# Patient Record
Sex: Male | Born: 1950 | Race: White | Hispanic: No | Smoking: Former smoker
Health system: Southern US, Community
[De-identification: ages and names within clinical notes are randomized; demographics above are authoritative.]

## PROBLEM LIST (undated history)

## (undated) DIAGNOSIS — K219 Gastro-esophageal reflux disease without esophagitis: Secondary | ICD-10-CM

## (undated) DIAGNOSIS — C61 Malignant neoplasm of prostate: Secondary | ICD-10-CM

## (undated) DIAGNOSIS — Z9889 Other specified postprocedural states: Secondary | ICD-10-CM

## (undated) DIAGNOSIS — E785 Hyperlipidemia, unspecified: Secondary | ICD-10-CM

## (undated) DIAGNOSIS — N400 Enlarged prostate without lower urinary tract symptoms: Secondary | ICD-10-CM

## (undated) DIAGNOSIS — I10 Essential (primary) hypertension: Secondary | ICD-10-CM

## (undated) HISTORY — DX: Essential (primary) hypertension: I10

## (undated) HISTORY — PX: TONSILLECTOMY: SUR1361

## (undated) HISTORY — DX: Gastro-esophageal reflux disease without esophagitis: K21.9

## (undated) HISTORY — DX: Other specified postprocedural states: Z98.890

## (undated) HISTORY — DX: Hyperlipidemia, unspecified: E78.5

## (undated) HISTORY — DX: Malignant neoplasm of prostate: C61

## (undated) HISTORY — DX: Benign prostatic hyperplasia without lower urinary tract symptoms: N40.0

---

## 1999-03-09 HISTORY — PX: NISSEN FUNDOPLICATION: SHX2091

## 2004-01-24 ENCOUNTER — Ambulatory Visit: Payer: Self-pay | Admitting: Internal Medicine

## 2004-01-31 ENCOUNTER — Ambulatory Visit: Payer: Self-pay | Admitting: Internal Medicine

## 2004-02-13 ENCOUNTER — Encounter: Admission: RE | Admit: 2004-02-13 | Discharge: 2004-02-25 | Payer: Self-pay | Admitting: *Deleted

## 2004-04-30 ENCOUNTER — Ambulatory Visit: Payer: Self-pay | Admitting: Internal Medicine

## 2004-08-06 ENCOUNTER — Ambulatory Visit: Payer: Self-pay | Admitting: Internal Medicine

## 2005-04-29 ENCOUNTER — Ambulatory Visit: Payer: Self-pay | Admitting: Internal Medicine

## 2005-05-06 ENCOUNTER — Ambulatory Visit: Payer: Self-pay | Admitting: Internal Medicine

## 2005-09-06 ENCOUNTER — Ambulatory Visit: Payer: Self-pay | Admitting: Internal Medicine

## 2006-07-19 ENCOUNTER — Ambulatory Visit: Payer: Self-pay | Admitting: Internal Medicine

## 2006-07-19 LAB — CONVERTED CEMR LAB
ALT: 35 units/L (ref 0–40)
AST: 24 units/L (ref 0–37)
Albumin: 4.2 g/dL (ref 3.5–5.2)
Alkaline Phosphatase: 87 units/L (ref 39–117)
BUN: 14 mg/dL (ref 6–23)
Basophils Absolute: 0 10*3/uL (ref 0.0–0.1)
Basophils Relative: 0.1 % (ref 0.0–1.0)
Bilirubin, Direct: 0.1 mg/dL (ref 0.0–0.3)
CO2: 28 meq/L (ref 19–32)
Calcium: 9.3 mg/dL (ref 8.4–10.5)
Chloride: 105 meq/L (ref 96–112)
Cholesterol: 165 mg/dL (ref 0–200)
Creatinine, Ser: 1 mg/dL (ref 0.4–1.5)
Eosinophils Absolute: 0.1 10*3/uL (ref 0.0–0.6)
Eosinophils Relative: 1.1 % (ref 0.0–5.0)
GFR calc Af Amer: 100 mL/min
GFR calc non Af Amer: 82 mL/min
Glucose, Bld: 91 mg/dL (ref 70–99)
HCT: 45.3 % (ref 39.0–52.0)
HDL: 41.6 mg/dL (ref >39.0)
Hemoglobin: 15.4 g/dL (ref 13.0–17.0)
LDL Cholesterol: 103 mg/dL — ABNORMAL HIGH (ref 0–99)
Lymphocytes Relative: 25.1 % (ref 12.0–46.0)
MCHC: 33.9 g/dL (ref 30.0–36.0)
MCV: 89 fL (ref 78.0–100.0)
Monocytes Absolute: 0.7 10*3/uL (ref 0.2–0.7)
Monocytes Relative: 8.1 % (ref 3.0–11.0)
Neutro Abs: 5.5 10*3/uL (ref 1.4–7.7)
Neutrophils Relative %: 65.6 % (ref 43.0–77.0)
PSA: 4.14 ng/mL — ABNORMAL HIGH (ref 0.10–4.00)
Platelets: 178 10*3/uL (ref 150–400)
Potassium: 4.5 meq/L (ref 3.5–5.1)
RBC: 5.09 M/uL (ref 4.22–5.81)
RDW: 12.7 % (ref 11.5–14.6)
Sodium: 139 meq/L (ref 135–145)
TSH: 1.24 microintl units/mL (ref 0.35–5.50)
Total Bilirubin: 0.7 mg/dL (ref 0.3–1.2)
Total CHOL/HDL Ratio: 4
Total Protein: 6.7 g/dL (ref 6.0–8.3)
Triglycerides: 103 mg/dL (ref 0–149)
VLDL: 21 mg/dL (ref 0–40)
WBC: 8.4 10*3/uL (ref 4.5–10.5)

## 2006-07-26 ENCOUNTER — Ambulatory Visit: Payer: Self-pay | Admitting: Internal Medicine

## 2006-07-28 ENCOUNTER — Encounter: Payer: Self-pay | Admitting: Internal Medicine

## 2006-07-28 DIAGNOSIS — Z9889 Other specified postprocedural states: Secondary | ICD-10-CM

## 2006-07-28 DIAGNOSIS — E785 Hyperlipidemia, unspecified: Secondary | ICD-10-CM

## 2006-07-28 DIAGNOSIS — K219 Gastro-esophageal reflux disease without esophagitis: Secondary | ICD-10-CM

## 2006-07-28 DIAGNOSIS — N400 Enlarged prostate without lower urinary tract symptoms: Secondary | ICD-10-CM

## 2006-07-28 HISTORY — DX: Benign prostatic hyperplasia without lower urinary tract symptoms: N40.0

## 2006-07-28 HISTORY — DX: Other specified postprocedural states: Z98.890

## 2006-07-28 HISTORY — DX: Hyperlipidemia, unspecified: E78.5

## 2006-07-28 HISTORY — DX: Gastro-esophageal reflux disease without esophagitis: K21.9

## 2007-01-30 ENCOUNTER — Telehealth (INDEPENDENT_AMBULATORY_CARE_PROVIDER_SITE_OTHER): Payer: Self-pay | Admitting: *Deleted

## 2007-02-03 ENCOUNTER — Telehealth: Payer: Self-pay | Admitting: Internal Medicine

## 2007-02-08 ENCOUNTER — Ambulatory Visit: Payer: Self-pay | Admitting: Internal Medicine

## 2007-02-09 ENCOUNTER — Telehealth (INDEPENDENT_AMBULATORY_CARE_PROVIDER_SITE_OTHER): Payer: Self-pay | Admitting: *Deleted

## 2007-02-09 LAB — CONVERTED CEMR LAB: PSA: 4.97 ng/mL — ABNORMAL HIGH (ref 0.10–4.00)

## 2007-02-16 ENCOUNTER — Encounter: Payer: Self-pay | Admitting: Internal Medicine

## 2007-02-24 ENCOUNTER — Encounter: Payer: Self-pay | Admitting: Internal Medicine

## 2007-02-28 ENCOUNTER — Encounter: Payer: Self-pay | Admitting: Internal Medicine

## 2007-03-09 HISTORY — PX: PROSTATECTOMY: SHX69

## 2007-03-20 ENCOUNTER — Encounter: Payer: Self-pay | Admitting: Internal Medicine

## 2007-03-27 ENCOUNTER — Ambulatory Visit: Payer: Self-pay | Admitting: Internal Medicine

## 2007-03-27 DIAGNOSIS — I10 Essential (primary) hypertension: Secondary | ICD-10-CM

## 2007-03-27 HISTORY — DX: Essential (primary) hypertension: I10

## 2007-04-20 ENCOUNTER — Encounter (INDEPENDENT_AMBULATORY_CARE_PROVIDER_SITE_OTHER): Payer: Self-pay | Admitting: Urology

## 2007-04-20 ENCOUNTER — Inpatient Hospital Stay (HOSPITAL_COMMUNITY): Admission: RE | Admit: 2007-04-20 | Discharge: 2007-04-21 | Payer: Self-pay | Admitting: Urology

## 2007-04-28 ENCOUNTER — Encounter: Payer: Self-pay | Admitting: Internal Medicine

## 2007-05-15 ENCOUNTER — Ambulatory Visit: Payer: Self-pay | Admitting: Internal Medicine

## 2007-06-02 ENCOUNTER — Encounter: Payer: Self-pay | Admitting: Internal Medicine

## 2007-07-24 ENCOUNTER — Ambulatory Visit: Payer: Self-pay | Admitting: Internal Medicine

## 2007-07-24 LAB — CONVERTED CEMR LAB
ALT: 36 units/L (ref 0–53)
AST: 26 units/L (ref 0–37)
Albumin: 4 g/dL (ref 3.5–5.2)
Alkaline Phosphatase: 97 units/L (ref 39–117)
BUN: 13 mg/dL (ref 6–23)
Basophils Absolute: 0 10*3/uL (ref 0.0–0.1)
Basophils Relative: 0.3 % (ref 0.0–1.0)
Bilirubin Urine: NEGATIVE
Bilirubin, Direct: 0.1 mg/dL (ref 0.0–0.3)
Blood in Urine, dipstick: NEGATIVE
CO2: 30 meq/L (ref 19–32)
Calcium: 9.3 mg/dL (ref 8.4–10.5)
Chloride: 108 meq/L (ref 96–112)
Cholesterol: 174 mg/dL (ref 0–200)
Creatinine, Ser: 0.9 mg/dL (ref 0.4–1.5)
Eosinophils Absolute: 0.1 10*3/uL (ref 0.0–0.7)
Eosinophils Relative: 1.3 % (ref 0.0–5.0)
GFR calc Af Amer: 112 mL/min
GFR calc non Af Amer: 93 mL/min
Glucose, Bld: 85 mg/dL (ref 70–99)
Glucose, Urine, Semiquant: NEGATIVE
HCT: 44.3 % (ref 39.0–52.0)
HDL: 42.5 mg/dL (ref >39.0)
Hemoglobin: 15.2 g/dL (ref 13.0–17.0)
Ketones, urine, test strip: NEGATIVE
LDL Cholesterol: 107 mg/dL — ABNORMAL HIGH (ref 0–99)
Lymphocytes Relative: 25.4 % (ref 12.0–46.0)
MCHC: 34.4 g/dL (ref 30.0–36.0)
MCV: 87.9 fL (ref 78.0–100.0)
Monocytes Absolute: 0.6 10*3/uL (ref 0.1–1.0)
Monocytes Relative: 7.1 % (ref 3.0–12.0)
Neutro Abs: 5.1 10*3/uL (ref 1.4–7.7)
Neutrophils Relative %: 65.9 % (ref 43.0–77.0)
Nitrite: NEGATIVE
PSA: 0 ng/mL — ABNORMAL LOW (ref 0.10–4.00)
Platelets: 170 10*3/uL (ref 150–400)
Potassium: 4.4 meq/L (ref 3.5–5.1)
RBC: 5.04 M/uL (ref 4.22–5.81)
RDW: 12.9 % (ref 11.5–14.6)
Sodium: 140 meq/L (ref 135–145)
Specific Gravity, Urine: 1.02
TSH: 1.21 microintl units/mL (ref 0.35–5.50)
Total Bilirubin: 0.6 mg/dL (ref 0.3–1.2)
Total CHOL/HDL Ratio: 4.1
Total Protein: 6.5 g/dL (ref 6.0–8.3)
Triglycerides: 121 mg/dL (ref 0–149)
Urobilinogen, UA: 0.2
VLDL: 24 mg/dL (ref 0–40)
WBC Urine, dipstick: NEGATIVE
WBC: 7.6 10*3/uL (ref 4.5–10.5)
pH: 7

## 2007-08-01 ENCOUNTER — Ambulatory Visit: Payer: Self-pay | Admitting: Internal Medicine

## 2007-08-01 DIAGNOSIS — C61 Malignant neoplasm of prostate: Secondary | ICD-10-CM

## 2007-08-01 HISTORY — DX: Malignant neoplasm of prostate: C61

## 2007-08-17 ENCOUNTER — Telehealth: Payer: Self-pay | Admitting: Internal Medicine

## 2008-01-30 ENCOUNTER — Encounter: Payer: Self-pay | Admitting: Internal Medicine

## 2008-02-08 ENCOUNTER — Ambulatory Visit: Payer: Self-pay | Admitting: Internal Medicine

## 2008-05-28 ENCOUNTER — Encounter: Payer: Self-pay | Admitting: Internal Medicine

## 2008-08-16 ENCOUNTER — Ambulatory Visit: Payer: Self-pay | Admitting: Internal Medicine

## 2008-08-16 LAB — CONVERTED CEMR LAB
ALT: 46 units/L (ref 0–53)
AST: 29 units/L (ref 0–37)
Albumin: 4.1 g/dL (ref 3.5–5.2)
Alkaline Phosphatase: 85 units/L (ref 39–117)
BUN: 16 mg/dL (ref 6–23)
Basophils Absolute: 0 10*3/uL (ref 0.0–0.1)
Basophils Relative: 0.3 % (ref 0.0–3.0)
Bilirubin Urine: NEGATIVE
Bilirubin, Direct: 0.1 mg/dL (ref 0.0–0.3)
Blood in Urine, dipstick: NEGATIVE
CO2: 29 meq/L (ref 19–32)
Calcium: 9 mg/dL (ref 8.4–10.5)
Chloride: 106 meq/L (ref 96–112)
Cholesterol: 171 mg/dL (ref 0–200)
Creatinine, Ser: 0.9 mg/dL (ref 0.4–1.5)
Eosinophils Absolute: 0.1 10*3/uL (ref 0.0–0.7)
Eosinophils Relative: 1.7 % (ref 0.0–5.0)
GFR calc non Af Amer: 92.1 mL/min (ref >60)
Glucose, Bld: 91 mg/dL (ref 70–99)
Glucose, Urine, Semiquant: NEGATIVE
HCT: 44.4 % (ref 39.0–52.0)
HDL: 46.3 mg/dL (ref >39.00)
Hemoglobin: 15.1 g/dL (ref 13.0–17.0)
Ketones, urine, test strip: NEGATIVE
LDL Cholesterol: 104 mg/dL — ABNORMAL HIGH (ref 0–99)
Lymphocytes Relative: 25.8 % (ref 12.0–46.0)
Lymphs Abs: 1.9 10*3/uL (ref 0.7–4.0)
MCHC: 34 g/dL (ref 30.0–36.0)
MCV: 90.4 fL (ref 78.0–100.0)
Monocytes Absolute: 0.6 10*3/uL (ref 0.1–1.0)
Monocytes Relative: 8.7 % (ref 3.0–12.0)
Neutro Abs: 4.7 10*3/uL (ref 1.4–7.7)
Neutrophils Relative %: 63.5 % (ref 43.0–77.0)
Nitrite: NEGATIVE
PSA: 0.01 ng/mL — ABNORMAL LOW (ref 0.10–4.00)
Platelets: 161 10*3/uL (ref 150.0–400.0)
Potassium: 4.2 meq/L (ref 3.5–5.1)
RBC: 4.92 M/uL (ref 4.22–5.81)
RDW: 12.4 % (ref 11.5–14.6)
Sodium: 137 meq/L (ref 135–145)
Specific Gravity, Urine: 1.02
TSH: 1.32 microintl units/mL (ref 0.35–5.50)
Total Bilirubin: 0.8 mg/dL (ref 0.3–1.2)
Total CHOL/HDL Ratio: 4
Total Protein: 6.6 g/dL (ref 6.0–8.3)
Triglycerides: 103 mg/dL (ref 0.0–149.0)
Urobilinogen, UA: 0.2
VLDL: 20.6 mg/dL (ref 0.0–40.0)
WBC Urine, dipstick: NEGATIVE
WBC: 7.3 10*3/uL (ref 4.5–10.5)
pH: 6.5

## 2008-08-22 ENCOUNTER — Encounter: Payer: Self-pay | Admitting: Internal Medicine

## 2008-08-23 ENCOUNTER — Ambulatory Visit: Payer: Self-pay | Admitting: Internal Medicine

## 2008-11-18 ENCOUNTER — Encounter: Payer: Self-pay | Admitting: Internal Medicine

## 2009-05-19 ENCOUNTER — Encounter: Payer: Self-pay | Admitting: Internal Medicine

## 2009-11-17 ENCOUNTER — Encounter: Payer: Self-pay | Admitting: Internal Medicine

## 2009-11-17 ENCOUNTER — Telehealth: Payer: Self-pay | Admitting: Internal Medicine

## 2010-01-09 ENCOUNTER — Telehealth: Payer: Self-pay | Admitting: Internal Medicine

## 2010-01-20 ENCOUNTER — Ambulatory Visit: Payer: Self-pay | Admitting: Internal Medicine

## 2010-01-20 LAB — CONVERTED CEMR LAB
ALT: 45 units/L (ref 0–53)
AST: 32 units/L (ref 0–37)
Albumin: 4.2 g/dL (ref 3.5–5.2)
Alkaline Phosphatase: 97 units/L (ref 39–117)
BUN: 15 mg/dL (ref 6–23)
Basophils Absolute: 0 10*3/uL (ref 0.0–0.1)
Basophils Relative: 0.5 % (ref 0.0–3.0)
Bilirubin Urine: NEGATIVE
Bilirubin, Direct: 0.1 mg/dL (ref 0.0–0.3)
Blood in Urine, dipstick: NEGATIVE
CO2: 26 meq/L (ref 19–32)
Calcium: 9.2 mg/dL (ref 8.4–10.5)
Chloride: 102 meq/L (ref 96–112)
Cholesterol: 173 mg/dL (ref 0–200)
Creatinine, Ser: 1 mg/dL (ref 0.4–1.5)
Eosinophils Absolute: 0.1 10*3/uL (ref 0.0–0.7)
Eosinophils Relative: 1.2 % (ref 0.0–5.0)
GFR calc non Af Amer: 78.43 mL/min (ref >60)
Glucose, Bld: 94 mg/dL (ref 70–99)
Glucose, Urine, Semiquant: NEGATIVE
HCT: 44.4 % (ref 39.0–52.0)
HDL: 45.7 mg/dL (ref >39.00)
Hemoglobin: 15.1 g/dL (ref 13.0–17.0)
Ketones, urine, test strip: NEGATIVE
LDL Cholesterol: 108 mg/dL — ABNORMAL HIGH (ref 0–99)
Lymphocytes Relative: 27.4 % (ref 12.0–46.0)
Lymphs Abs: 2.1 10*3/uL (ref 0.7–4.0)
MCHC: 34 g/dL (ref 30.0–36.0)
MCV: 90.5 fL (ref 78.0–100.0)
Monocytes Absolute: 0.7 10*3/uL (ref 0.1–1.0)
Monocytes Relative: 8.3 % (ref 3.0–12.0)
Neutro Abs: 4.9 10*3/uL (ref 1.4–7.7)
Neutrophils Relative %: 62.6 % (ref 43.0–77.0)
Nitrite: NEGATIVE
PSA: 0 ng/mL — ABNORMAL LOW (ref 0.10–4.00)
Platelets: 158 10*3/uL (ref 150.0–400.0)
Potassium: 4.5 meq/L (ref 3.5–5.1)
RBC: 4.91 M/uL (ref 4.22–5.81)
RDW: 13.7 % (ref 11.5–14.6)
Sodium: 134 meq/L — ABNORMAL LOW (ref 135–145)
Specific Gravity, Urine: 1.02
TSH: 0.95 microintl units/mL (ref 0.35–5.50)
Total Bilirubin: 0.6 mg/dL (ref 0.3–1.2)
Total CHOL/HDL Ratio: 4
Total Protein: 6.2 g/dL (ref 6.0–8.3)
Triglycerides: 99 mg/dL (ref 0.0–149.0)
Urobilinogen, UA: 0.2
VLDL: 19.8 mg/dL (ref 0.0–40.0)
WBC Urine, dipstick: NEGATIVE
WBC: 7.8 10*3/uL (ref 4.5–10.5)
pH: 7

## 2010-02-02 ENCOUNTER — Ambulatory Visit: Payer: Self-pay | Admitting: Internal Medicine

## 2010-02-03 ENCOUNTER — Encounter (INDEPENDENT_AMBULATORY_CARE_PROVIDER_SITE_OTHER): Payer: Self-pay | Admitting: *Deleted

## 2010-03-06 ENCOUNTER — Encounter (INDEPENDENT_AMBULATORY_CARE_PROVIDER_SITE_OTHER): Payer: Self-pay | Admitting: *Deleted

## 2010-03-11 ENCOUNTER — Ambulatory Visit
Admission: RE | Admit: 2010-03-11 | Discharge: 2010-03-11 | Payer: Self-pay | Source: Home / Self Care | Attending: Internal Medicine | Admitting: Internal Medicine

## 2010-03-18 ENCOUNTER — Encounter: Payer: Self-pay | Admitting: Internal Medicine

## 2010-03-18 ENCOUNTER — Ambulatory Visit
Admission: RE | Admit: 2010-03-18 | Discharge: 2010-03-18 | Payer: Self-pay | Source: Home / Self Care | Attending: Internal Medicine | Admitting: Internal Medicine

## 2010-03-24 ENCOUNTER — Encounter: Payer: Self-pay | Admitting: Internal Medicine

## 2010-03-26 LAB — HM COLONOSCOPY

## 2010-04-07 NOTE — Letter (Signed)
Summary: [facility]  [facility]   Imported By: Maryln Gottron 06/04/2008 11:18:39  _____________________________________________________________________  External Attachment:    Type:   Image     Comment:   External Document

## 2010-04-07 NOTE — Progress Notes (Signed)
Summary: psa lab -SCHEDULED   Phone Note Call from Patient Call back at Home Phone [phone]   Caller: Patient Call For: drk Summary of Call: pt stated its time for psa recheck can i sch it Initial call taken by: Heron Sabins,  February 03, 2007 10:41 AM  Follow-up for Phone Call        lm with pt wife Follow-up by: Heron Sabins,  February 06, 2007 3:45 PM  Additional Follow-up for Phone Call Additional follow up Details #1::        PT WILL COME ON WEDNESDAY FOR PSA PER DR. Additional Follow-up by: Warnell Forester,  February 06, 2007 4:03 PM    Yes

## 2010-04-07 NOTE — Consult Note (Signed)
Summary: [facility]/Kimbrough, MD  [facility]/Kimbrough, MD   Imported By: Lenard Forth 05/06/2007 11:43:38  _____________________________________________________________________  External Attachment:    Type:   Image     Comment:   External Document

## 2010-04-07 NOTE — Letter (Signed)
[facility]   Imported By: Kassie Mends 03/06/2007 09:42:32  _____________________________________________________________________  External Attachment:    Type:   Image     Comment:   [facility]

## 2010-04-07 NOTE — Letter (Signed)
Summary: Pre Visit Letter Revised  [facility]  [address] [place], [place] [address]   Phone: [phone]  Fax: [phone]        02/03/2010 MRN: [id number] [facility] [address] [place], [place]  [address]             Procedure Date:  03/18/2010  Welcome to the Gastroenterology Division at [facility].    You are scheduled to see a nurse for your pre-procedure visit on 03/10/2010 at 1:00PM on the 3rd floor at [facility], [address].  We ask that you try to arrive at our office 15 minutes prior to your appointment time to allow for check-in.  Please take a minute to review the attached form.  If you answer "Yes" to one or more of the questions on the first page, we ask that you call the person listed at your earliest opportunity.  If you answer "No" to all of the questions, please complete the rest of the form and bring it to your appointment.    Your nurse visit will consist of discussing your medical and surgical history, your immediate family medical history, and your medications.   If you are unable to list all of your medications on the form, please bring the medication bottles to your appointment and we will list them.  We will need to be aware of both prescribed and over the counter drugs.  We will need to know exact dosage information as well.    Please be prepared to read and sign documents such as consent forms, a financial agreement, and acknowledgement forms.  If necessary, and with your consent, a friend or relative is welcome to sit-in on the nurse visit with you.  Please bring your insurance card so that we may make a copy of it.  If your insurance requires a referral to see a specialist, please bring your referral form from your primary care physician.  No co-pay is required for this nurse visit.     If you cannot keep your appointment, please call [phone] to cancel or reschedule prior to your appointment date.  This allows [place]  the opportunity to schedule an appointment for another patient in need of care.    Thank you for choosing [facility] for your medical needs.  We appreciate the opportunity to care for you.  Please visit [place] at our website  to learn more about our practice.  Sincerely, The Gastroenterology Division

## 2010-04-07 NOTE — Assessment & Plan Note (Signed)
Summary: HYPERTENSION/ DM  Medications Added LIPITOR 40 MG  TABS (ATORVASTATIN CALCIUM) 1 once daily ADULT ASPIRIN EC LOW STRENGTH 81 MG  TBEC (ASPIRIN) 1 once daily ALPRAZOLAM 0.5 MG  TABS (ALPRAZOLAM) one twice daily as needed      Allergies Added: NKDA  Vital Signs:  Patient Profile:   60 Years Old Male Height:     74 inches (187.96 cm) Weight:      240 pounds Temp:     98.1 degrees F oral BP sitting:   144 / 90  (left arm) Cuff size:   regular  Vitals Entered By: Raechel Ache, RN (March 27, 2007 4:29 PM)                 Chief Complaint:  Says BP has been elevated; headaches. Very stressed out about dx of prostate CA and upcoming surgery. Needs nerve pill. Has red spot OD since Sat..  History of Present Illness: 59 year old patient seen today for follow-up ;complaints include increasing stress.  This is multi-factorial, and related to her pressures, illness in his family, and a recent diagnosis of prostate cancer.  He is anticipating surgery next month.  Blood pressure readings at urology have been consistently high.  He complains of some worsening situational stress and is requesting alprazolam  Current Allergies: No known allergies       Physical Exam  General:     Well-developed,well-nourished,in no acute distress; alert,appropriate and cooperative throughout examination  150/94 Head:     Normocephalic and atraumatic without obvious abnormalities. No apparent alopecia or balding. Eyes:     right-sided sub-conjunctival. hemorrhage Ears:     External ear exam shows no significant lesions or deformities.  Otoscopic examination reveals clear canals, tympanic membranes are intact bilaterally without bulging, retraction, inflammation or discharge. Hearing is grossly normal bilaterally. Nose:     External nasal examination shows no deformity or inflammation. Nasal mucosa are pink and moist without lesions or exudates. Mouth:     Oral mucosa and oropharynx  without lesions or exudates.  Teeth in good repair.    Impression & Recommendations:  Problem # 1:  ESSENTIAL HYPERTENSION (ICD-401.9)  Problem # 2:  GERD (ICD-530.81)  Complete Medication List: 1)  Lipitor 40 Mg Tabs (Atorvastatin calcium) .Marland Kitchen.. 1 once daily 2)  Adult Aspirin Ec Low Strength 81 Mg Tbec (Aspirin) .Marland Kitchen.. 1 once daily 3)  Alprazolam 0.5 Mg Tabs (Alprazolam) .... One twice daily as needed   Patient Instructions: 1)  return office visit in 6 weeks 2)  Limit your Sodium (Salt) to less than 2 grams a day(slightly less than 1/2 a teaspoon) to prevent fluid retention, swelling, or worsening of symptoms. 3)  It is important that you exercise regularly at least 20 minutes 5 times a week. If you develop chest pain, have severe difficulty breathing, or feel very tired , stop exercising immediately and seek medical attention.    Prescriptions: ALPRAZOLAM 0.5 MG  TABS (ALPRAZOLAM) one twice daily as needed  #100 x 2   Entered and Authorized by:   Gordy Savers  MD   Signed by:   Gordy Savers  MD on 03/27/2007   Method used:   Print then Give to Patient   RxID:   [id number]  ]

## 2010-04-07 NOTE — Progress Notes (Signed)
Summary: refill alprazolam  Phone Note Refill Request Message from:  Fax from Pharmacy on November 17, 2009 1:56 PM  Refills Requested: Medication #1:  ALPRAZOLAM 0.5 MG  TABS one twice daily as needed   Last [facility]: [facility] [address]      Method Requested: Fax to Local Pharmacy Initial call taken by: Duard Brady LPN,  November 17, 2009 1:56 PM    Prescriptions: ALPRAZOLAM 0.5 MG  TABS (ALPRAZOLAM) one twice daily as needed  #60 x 0   Entered by:   Duard Brady LPN   Authorized by:   Gordy Savers  MD   Signed by:   Duard Brady LPN on 16/12/9602   Method used:   Historical   RxID:   [id number]  must be seen - last seen 08/2008. KIK

## 2010-04-07 NOTE — Assessment & Plan Note (Signed)
Summary: cpx/nta   Vital Signs:  Patient Profile:   60 Years Old Male Height:     74 inches (187.96 cm) Weight:      239 pounds Temp:     98.1 degrees F oral BP sitting:   132 / 90  (left arm) Cuff size:   regular  Vitals Entered By: Raechel Ache, RN (Aug 01, 2007 1:13 PM)                 Chief Complaint:  CPX and labs done.Marland Kitchen  History of Present Illness: 60 year old patient seen today for an annual exam.  She has a history of hypercholesterolemia gastroesophageal reflux disease, and history of prostate cancer    Current Allergies: No known allergies   Past Medical History:    GERD    Hyperlipidemia    Benign prostatic hypertrophy    Hypertension    prostate cancer  Past Surgical History:    Tonsillectomy    sigmoidoscopy 2004    prostatectomy  2009   Family History:    father died age 50, prostate cancer    mother late 47s history of chronic atrial fibrillation; CVD        Two brothers in good health  Social History:    Reviewed history and no changes required:       Married    Review of Systems       follow with Dr. Aldean Ast for prostate cancer   Physical Exam  General:     Well-developed,well-nourished,in no acute distress; alert,appropriate and cooperative throughout examination Head:     Normocephalic and atraumatic without obvious abnormalities. No apparent alopecia or balding. Eyes:     No corneal or conjunctival inflammation noted. EOMI. Perrla. Funduscopic exam benign, without hemorrhages, exudates or papilledema. Vision grossly normal. Mouth:     Oral mucosa and oropharynx without lesions or exudates.  Teeth in good repair. Neck:     No deformities, masses, or tenderness noted. Lungs:     Normal respiratory effort, chest expands symmetrically. Lungs are clear to auscultation, no crackles or wheezes. Heart:     Normal rate and regular rhythm. S1 and S2 normal without gallop, murmur, click, rub or other extra sounds. Abdomen:  Bowel sounds positive,abdomen soft and non-tender without masses, organomegaly or hernias noted. Genitalia:     Testes bilaterally descended without nodularity, tenderness or masses. No scrotal masses or lesions. No penis lesions or urethral discharge. Msk:     No deformity or scoliosis noted of thoracic or lumbar spine.   Pulses:     R and L carotid,radial,femoral,dorsalis pedis and posterior tibial pulses are full and equal bilaterally Extremities:     No clubbing, cyanosis, edema, or deformity noted with normal full range of motion of all joints.   Neurologic:     No cranial nerve deficits noted. Station and gait are normal. Plantar reflexes are down-going bilaterally. DTRs are symmetrical throughout. Sensory, motor and coordinative functions appear intact. Skin:     Intact without suspicious lesions or rashes Cervical Nodes:     No lymphadenopathy noted Axillary Nodes:     No palpable lymphadenopathy Inguinal Nodes:     No significant adenopathy Psych:     Cognition and judgment appear intact. Alert and cooperative with normal attention span and concentration. No apparent delusions, illusions, hallucinations    Impression & Recommendations:  Problem # 1:  HYPERTENSION (ICD-401.9)  His updated medication list for this problem includes:    Benazepril-hydrochlorothiazide 20-12.5 Mg Tabs (  Benazepril-hydrochlorothiazide) ..... One daily   Problem # 2:  HYPERLIPIDEMIA (ICD-272.4)  His updated medication list for this problem includes:    Lipitor 40 Mg Tabs (Atorvastatin calcium) .Marland Kitchen... 1 once daily   Problem # 3:  GERD (ICD-530.81)  Problem # 4:  NEOPLASM, MALIGNANT, PROSTATE (ICD-185)  Complete Medication List: 1)  Lipitor 40 Mg Tabs (Atorvastatin calcium) .Marland Kitchen.. 1 once daily 2)  Adult Aspirin Ec Low Strength 81 Mg Tbec (Aspirin) .Marland Kitchen.. 1 once daily 3)  Alprazolam 0.5 Mg Tabs (Alprazolam) .... One twice daily as needed 4)  Benazepril-hydrochlorothiazide 20-12.5 Mg Tabs  (Benazepril-hydrochlorothiazide) .... One daily   Patient Instructions: 1)  Please schedule a follow-up appointment in 6 months. 2)  Limit your Sodium (Salt) to less than 2 grams a day(slightly less than 1/2 a teaspoon) to prevent fluid retention, swelling, or worsening of symptoms. 3)  It is important that you exercise regularly at least 20 minutes 5 times a week. If you develop chest pain, have severe difficulty breathing, or feel very tired , stop exercising immediately and seek medical attention. 4)  Schedule a colonoscopy/sigmoidoscopy to help detect colon cancer.   Prescriptions: BENAZEPRIL-HYDROCHLOROTHIAZIDE 20-12.5 MG  TABS (BENAZEPRIL-HYDROCHLOROTHIAZIDE) one daily  #90 x 6   Entered and Authorized by:   Gordy Savers  MD   Signed by:   Gordy Savers  MD on 08/01/2007   Method used:   Print then Give to Patient   RxID:   [id number] ALPRAZOLAM 0.5 MG  TABS (ALPRAZOLAM) one twice daily as needed  #100 x 2   Entered and Authorized by:   Gordy Savers  MD   Signed by:   Gordy Savers  MD on 08/01/2007   Method used:   Print then Give to Patient   RxID:   [id number] LIPITOR 40 MG  TABS (ATORVASTATIN CALCIUM) 1 once daily  #90 x 6   Entered and Authorized by:   Gordy Savers  MD   Signed by:   Gordy Savers  MD on 08/01/2007   Method used:   Print then Give to Patient   RxID:   [id number]  ]

## 2010-04-07 NOTE — Assessment & Plan Note (Signed)
Summary: 6 WEEK ROA/JLS  Medications Added BENAZEPRIL-HYDROCHLOROTHIAZIDE 20-12.5 MG  TABS (BENAZEPRIL-HYDROCHLOROTHIAZIDE) one daily        Vital Signs:  Patient Profile:   60 Years Old Male Height:     74 inches (187.96 cm) Weight:      232 pounds Temp:     97.9 degrees F oral BP standing:   138 / 108  (left arm) Cuff size:   regular  Vitals Entered By: Raechel Ache, RN (May 15, 2007 4:16 PM)                 Chief Complaint:  ROV; taking Benicar for BP. Had prostate surgery.Marland Kitchen  History of Present Illness: 60 year old gentleman seen today for follow-up.  Since his last visit here.  He has had radical prostatectomy approximately 1 month ago.  Blood pressure.  He is having quite labile, but generally under reasonable control.  He is on Benicar 20 mg daily    Current Allergies: No known allergies       Physical Exam  General:     Well-developed,well-nourished,in no acute distress; alert,appropriate and cooperative throughout examination  136/86    Impression & Recommendations:  Problem # 1:  ESSENTIAL HYPERTENSION (ICD-401.9)  His updated medication list for this problem includes:    Benazepril-hydrochlorothiazide 20-12.5 Mg Tabs (Benazepril-hydrochlorothiazide) ..... One daily   Problem # 2:  HYPERLIPIDEMIA (ICD-272.4)  His updated medication list for this problem includes:    Lipitor 40 Mg Tabs (Atorvastatin calcium) .Marland Kitchen... 1 once daily   Complete Medication List: 1)  Lipitor 40 Mg Tabs (Atorvastatin calcium) .Marland Kitchen.. 1 once daily 2)  Adult Aspirin Ec Low Strength 81 Mg Tbec (Aspirin) .Marland Kitchen.. 1 once daily 3)  Alprazolam 0.5 Mg Tabs (Alprazolam) .... One twice daily as needed 4)  Benazepril-hydrochlorothiazide 20-12.5 Mg Tabs (Benazepril-hydrochlorothiazide) .... One daily   Patient Instructions: 1)  Please schedule a follow-up appointment in 3 months. 2)  Limit your Sodium (Salt) to less than 2 grams a day(slightly less than 1/2 a teaspoon) to prevent  fluid retention, swelling, or worsening of symptoms.    Prescriptions: LIPITOR 40 MG  TABS (ATORVASTATIN CALCIUM) 1 once daily  #90 x 6   Entered and Authorized by:   Gordy Savers  MD   Signed by:   Gordy Savers  MD on 05/15/2007   Method used:   Print then Give to Patient   RxID:   [id number] BENAZEPRIL-HYDROCHLOROTHIAZIDE 20-12.5 MG  TABS (BENAZEPRIL-HYDROCHLOROTHIAZIDE) one daily  #90 x 6   Entered and Authorized by:   Gordy Savers  MD   Signed by:   Gordy Savers  MD on 05/15/2007   Method used:   Print then Give to Patient   RxID:   [id number] ALPRAZOLAM 0.5 MG  TABS (ALPRAZOLAM) one twice daily as needed  #100 x 2   Entered and Authorized by:   Gordy Savers  MD   Signed by:   Gordy Savers  MD on 05/15/2007   Method used:   Print then Give to Patient   RxID:   [id number]  ]

## 2010-04-07 NOTE — Progress Notes (Signed)
Summary: correspondence from ins   Phone Note Call from Patient Call back at Home Phone [phone]   Caller: patient (triage Call For: k Summary of Call: nationwide to send mail about labs  they rejected his insurance because of a high psa rating  have you received this correspondence Initial call taken by: Roselle Locus,  January 30, 2007 9:50 AM  Follow-up for Phone Call        not that i know of we'll keep an eye out for it. Follow-up by: Kassie Mends,  January 31, 2007 10:37 AM

## 2010-04-07 NOTE — Letter (Signed)
Summary: [facility]  [facility]   Imported By: Maryln Gottron 11/25/2009 14:27:06  _____________________________________________________________________  External Attachment:    Type:   Image     Comment:   External Document

## 2010-04-07 NOTE — Assessment & Plan Note (Signed)
Summary: CPX // RS   Vital Signs:  Patient profile:   60 year old male Height:      74 inches Weight:      260 pounds BMI:     33.50 Temp:     98.0 degrees F oral BP sitting:   120 / 80  (left arm) Cuff size:   regular  Vitals Entered By: Duard Brady LPN (February 02, 2010 1:03 PM) CC: cpx - doing ok    some swelling in feet Is Patient Diabetic? No   CC:  cpx - doing ok    some swelling in feet.  History of Present Illness: 60 year old patient seen today for a wellness exam.  Medical problems include hypertension, dyslipidemia, and history of prostate cancer.  He is doing quite well. He is followed  closely by urology.  Preventive Screening-Counseling & Management  Alcohol-Tobacco     Smoking Status: quit  Allergies (verified): No Known Drug Allergies  Past History:  Past Medical History: Reviewed history from 08/23/2008 and no changes required. GERD Hyperlipidemia Benign prostatic hypertrophy, history of Hypertension prostate cancer  Past Surgical History: Reviewed history from 08/23/2008 and no changes required. Tonsillectomy sigmoidoscopy 2004 prostatectomy   February/2009  Family History: Reviewed history from 08/01/2007 and no changes required. father died age 27, prostate cancer mother late 2s history of chronic atrial fibrillation; CVD  Two brothers in good health; s/p benighn brain tumor  Social History: Reviewed history from 08/01/2007 and no changes required. Married  Review of Systems  The patient denies anorexia, fever, weight loss, weight gain, vision loss, decreased hearing, hoarseness, chest pain, syncope, dyspnea on exertion, peripheral edema, prolonged cough, headaches, hemoptysis, abdominal pain, melena, hematochezia, severe indigestion/heartburn, hematuria, incontinence, genital sores, muscle weakness, suspicious skin lesions, transient blindness, difficulty walking, depression, unusual weight change, abnormal bleeding, enlarged  lymph nodes, angioedema, breast masses, and testicular masses.    Physical Exam  General:  overweight-appearing.  120/72 Head:  Normocephalic and atraumatic without obvious abnormalities. No apparent alopecia or balding. Eyes:  No corneal or conjunctival inflammation noted. EOMI. Perrla. Funduscopic exam benign, without hemorrhages, exudates or papilledema. Vision grossly normal. Ears:  External ear exam shows no significant lesions or deformities.  Otoscopic examination reveals clear canals, tympanic membranes are intact bilaterally without bulging, retraction, inflammation or discharge. Hearing is grossly normal bilaterally. Nose:  External nasal examination shows no deformity or inflammation. Nasal mucosa are pink and moist without lesions or exudates. Mouth:  Oral mucosa and oropharynx without lesions or exudates.  Teeth in good repair. Neck:  No deformities, masses, or tenderness noted. Chest Wall:  No deformities, masses, tenderness or gynecomastia noted. Breasts:  No masses or gynecomastia noted Lungs:  Normal respiratory effort, chest expands symmetrically. Lungs are clear to auscultation, no crackles or wheezes. Heart:  Normal rate and regular rhythm. S1 and S2 normal without gallop, murmur, click, rub or other extra sounds. Abdomen:  Bowel sounds positive,abdomen soft and non-tender without masses, organomegaly or hernias noted. Genitalia:  Testes bilaterally descended without nodularity, tenderness or masses. No scrotal masses or lesions. No penis lesions or urethral discharge. Msk:  No deformity or scoliosis noted of thoracic or lumbar spine.   Pulses:  R and L carotid,radial,femoral,dorsalis pedis and posterior tibial pulses are full and equal bilaterally Extremities:  No clubbing, cyanosis, edema, or deformity noted with normal full range of motion of all joints.   Neurologic:  No cranial nerve deficits noted. Station and gait are normal. Plantar reflexes are down-going bilaterally.  DTRs are symmetrical throughout. Sensory, motor and coordinative functions appear intact. Skin:  Intact without suspicious lesions or rashes Cervical Nodes:  No lymphadenopathy noted Axillary Nodes:  No palpable lymphadenopathy Inguinal Nodes:  No significant adenopathy Psych:  Cognition and judgment appear intact. Alert and cooperative with normal attention span and concentration. No apparent delusions, illusions, hallucinations   Impression & Recommendations:  Problem # 1:  PHYSICAL EXAMINATION (ICD-V70.0)  Orders: Gastroenterology Referral (GI)  Complete Medication List: 1)  Lipitor 40 Mg Tabs (Atorvastatin calcium) .Marland Kitchen.. 1 once daily 2)  Adult Aspirin Ec Low Strength 81 Mg Tbec (Aspirin) .Marland Kitchen.. 1 once daily 3)  Alprazolam 0.5 Mg Tabs (Alprazolam) .... One twice daily as needed 4)  Benazepril-hydrochlorothiazide 20-12.5 Mg Tabs (Benazepril-hydrochlorothiazide) .... One daily  Patient Instructions: 1)  Please schedule a follow-up appointment in 1 year. 2)  Limit your Sodium (Salt). 3)  It is important that you exercise regularly at least 20 minutes 5 times a week. If you develop chest pain, have severe difficulty breathing, or feel very tired , stop exercising immediately and seek medical attention. 4)  You need to lose weight. Consider a lower calorie diet and regular exercise.  5)  Schedule a colonoscopy/sigmoidoscopy to help detect colon cancer. Prescriptions: BENAZEPRIL-HYDROCHLOROTHIAZIDE 20-12.5 MG  TABS (BENAZEPRIL-HYDROCHLOROTHIAZIDE) one daily  #90 Tablet x 6   Entered and Authorized by:   Gordy Savers  MD   Signed by:   Gordy Savers  MD on 02/02/2010   Method used:   Print then Give to Patient   RxID:   [id number] ALPRAZOLAM 0.5 MG  TABS (ALPRAZOLAM) one twice daily as needed  #60 x 3   Entered and Authorized by:   Gordy Savers  MD   Signed by:   Gordy Savers  MD on 02/02/2010   Method used:   Print then Give to Patient   RxID:    [id number] LIPITOR 40 MG  TABS (ATORVASTATIN CALCIUM) 1 once daily Brand medically necessary #90 Tablet x 6   Entered and Authorized by:   Gordy Savers  MD   Signed by:   Gordy Savers  MD on 02/02/2010   Method used:   Print then Give to Patient   RxID:   [id number]    Orders Added: 1)  Est. Patient 40-64 years [51761] 2)  Gastroenterology Referral [GI]

## 2010-04-07 NOTE — Letter (Signed)
[facility]   Imported By: Kassie Mends 03/17/2007 09:01:58  _____________________________________________________________________  External Attachment:    Type:   Image     Comment:   [facility]

## 2010-04-07 NOTE — Letter (Signed)
[facility]   Imported By: Kassie Mends 03/24/2007 15:16:59  _____________________________________________________________________  External Attachment:    Type:   Image     Comment:   [facility]

## 2010-04-07 NOTE — Progress Notes (Signed)
Summary: refill alprazolam x1 only  Phone Note Refill Request Message from:  Fax from Pharmacy on January 09, 2010 2:04 PM  Refills Requested: Medication #1:  ALPRAZOLAM 0.5 MG  TABS one twice daily as needed   Last [facility]: [facility] [address]   Method Requested: Fax to Local Pharmacy Initial call taken by: Duard Brady LPN,  January 09, 2010 2:04 PM    Prescriptions: ALPRAZOLAM 0.5 MG  TABS (ALPRAZOLAM) one twice daily as needed  #60 x 0   Entered by:   Duard Brady LPN   Authorized by:   Gordy Savers  MD   Signed by:   Duard Brady LPN on 81/19/1478   Method used:   Historical   RxID:   [id number]  [facility] - must be [facility] for future refills - last seen 08/2008   [facility]

## 2010-04-07 NOTE — Consult Note (Signed)
Summary: [facility]   Imported By: Kassie Mends 05/12/2007 09:07:02  _____________________________________________________________________  External Attachment:    Type:   Image     Comment:   [facility]

## 2010-04-07 NOTE — Assessment & Plan Note (Signed)
Summary: cpx/njr   Vital Signs:  Patient profile:   60 year old male Weight:      247 pounds BMI:     31.83 BP sitting:   138 / 78  (left arm) Cuff size:   regular  Vitals Entered By: Raechel Ache, RN (August 23, 2008 8:58 AM)  CC:  CPX and labs done.Marland Kitchen  History of Present Illness: 60 year old patient is seen today for an annual exam.  Medical problems include hypertension, dyslipidemia, and a history of prostate cancer.  He is followed closely by urology.  doing well without cardiopulmonary complaints  Problems Prior to Update: 1)  Neoplasm, Malignant, Prostate  (ICD-185) 2)  Hypertension  (ICD-401.9) 3)  Physical Examination  (ICD-V70.0) 4)  Essential Hypertension  (ICD-401.9) 5)  Nissen Fundoplication, Hx of  (ICD-V15.2) 6)  Benign Prostatic Hypertrophy  (ICD-600.00) 7)  Hyperlipidemia  (ICD-272.4) 8)  Gerd  (ICD-530.81)  Medications Prior to Update: 1)  Lipitor 40 Mg  Tabs (Atorvastatin Calcium) .Marland Kitchen.. 1 Once Daily 2)  Adult Aspirin Ec Low Strength 81 Mg  Tbec (Aspirin) .Marland Kitchen.. 1 Once Daily 3)  Alprazolam 0.5 Mg  Tabs (Alprazolam) .... One Twice Daily As Needed 4)  Benazepril-Hydrochlorothiazide 20-12.5 Mg  Tabs (Benazepril-Hydrochlorothiazide) .... One Daily  Allergies: No Known Drug Allergies  Past History:  Past Medical History: GERD Hyperlipidemia Benign prostatic hypertrophy, history of Hypertension prostate cancer  Past Surgical History: Tonsillectomy sigmoidoscopy 2004 prostatectomy   February/2009  Family History: Reviewed history from 08/01/2007 and no changes required. father died age 54, prostate cancer mother late 68s history of chronic atrial fibrillation; CVD  Two brothers in good health  Social History: Reviewed history from 08/01/2007 and no changes required. Married  Review of Systems  The patient denies anorexia, fever, weight loss, weight gain, vision loss, decreased hearing, hoarseness, chest pain, syncope, dyspnea on exertion,  peripheral edema, prolonged cough, headaches, hemoptysis, abdominal pain, melena, hematochezia, severe indigestion/heartburn, hematuria, incontinence, genital sores, muscle weakness, suspicious skin lesions, transient blindness, difficulty walking, depression, unusual weight change, abnormal bleeding, enlarged lymph nodes, angioedema, breast masses, and testicular masses.    Physical Exam  General:  overweight-appearing.  130/78 Head:  Normocephalic and atraumatic without obvious abnormalities. No apparent alopecia or balding. Eyes:  No corneal or conjunctival inflammation noted. EOMI. Perrla. Funduscopic exam benign, without hemorrhages, exudates or papilledema. Vision grossly normal. Ears:  External ear exam shows no significant lesions or deformities.  Otoscopic examination reveals clear canals, tympanic membranes are intact bilaterally without bulging, retraction, inflammation or discharge. Hearing is grossly normal bilaterally. Nose:  External nasal examination shows no deformity or inflammation. Nasal mucosa are pink and moist without lesions or exudates. Mouth:  Oral mucosa and oropharynx without lesions or exudates.  Teeth in good repair. Neck:  No deformities, masses, or tenderness noted. Chest Wall:  No deformities, masses, tenderness or gynecomastia noted. Breasts:  No masses or gynecomastia noted Lungs:  Normal respiratory effort, chest expands symmetrically. Lungs are clear to auscultation, no crackles or wheezes. Heart:  Normal rate and regular rhythm. S1 and S2 normal without gallop, murmur, click, rub or other extra sounds. Abdomen:  Bowel sounds positive,abdomen soft and non-tender without masses, organomegaly or hernias noted. Genitalia:  Testes bilaterally descended without nodularity, tenderness or masses. No scrotal masses or lesions. No penis lesions or urethral discharge. Msk:  No deformity or scoliosis noted of thoracic or lumbar spine.   Pulses:  R and L  carotid,radial,femoral,dorsalis pedis and posterior tibial pulses are full and equal  bilaterally Extremities:  No clubbing, cyanosis, edema, or deformity noted with normal full range of motion of all joints.   Neurologic:  No cranial nerve deficits noted. Station and gait are normal. Plantar reflexes are down-going bilaterally. DTRs are symmetrical throughout. Sensory, motor and coordinative functions appear intact. Skin:  Intact without suspicious lesions or rashes Cervical Nodes:  No lymphadenopathy noted Axillary Nodes:  No palpable lymphadenopathy Inguinal Nodes:  No significant adenopathy Psych:  Cognition and judgment appear intact. Alert and cooperative with normal attention span and concentration. No apparent delusions, illusions, hallucinations   Impression & Recommendations:  Problem # 1:  NEOPLASM, MALIGNANT, PROSTATE (ICD-185)  Problem # 2:  HYPERTENSION (ICD-401.9)  His updated medication list for this problem includes:    Benazepril-hydrochlorothiazide 20-12.5 Mg Tabs (Benazepril-hydrochlorothiazide) ..... One daily  Orders: EKG w/ Interpretation (93000)  Problem # 3:  HYPERLIPIDEMIA (ICD-272.4)  His updated medication list for this problem includes:    Lipitor 40 Mg Tabs (Atorvastatin calcium) .Marland Kitchen... 1 once daily  Problem # 4:  GERD (ICD-530.81)  Complete Medication List: 1)  Lipitor 40 Mg Tabs (Atorvastatin calcium) .Marland Kitchen.. 1 once daily 2)  Adult Aspirin Ec Low Strength 81 Mg Tbec (Aspirin) .Marland Kitchen.. 1 once daily 3)  Alprazolam 0.5 Mg Tabs (Alprazolam) .... One twice daily as needed 4)  Benazepril-hydrochlorothiazide 20-12.5 Mg Tabs (Benazepril-hydrochlorothiazide) .... One daily  Other Orders: Gastroenterology Referral (GI)  Patient Instructions: 1)  Please schedule a follow-up appointment in 1 year. 2)  Limit your Sodium (Salt). 3)  It is important that you exercise regularly at least 20 minutes 5 times a week. If you develop chest pain, have severe difficulty  breathing, or feel very tired , stop exercising immediately and seek medical attention. 4)  You need to lose weight. Consider a lower calorie diet and regular exercise.  5)  Schedule a colonoscopy/sigmoidoscopy to help detect colon cancer. Prescriptions: BENAZEPRIL-HYDROCHLOROTHIAZIDE 20-12.5 MG  TABS (BENAZEPRIL-HYDROCHLOROTHIAZIDE) one daily  #90 Tablet x 5   Entered and Authorized by:   Gordy Savers  MD   Signed by:   Gordy Savers  MD on 08/23/2008   Method used:   Print then Give to Patient   RxID:   [id number] ALPRAZOLAM 0.5 MG  TABS (ALPRAZOLAM) one twice daily as needed  #100 x 5   Entered and Authorized by:   Gordy Savers  MD   Signed by:   Gordy Savers  MD on 08/23/2008   Method used:   Print then Give to Patient   RxID:   [id number] LIPITOR 40 MG  TABS (ATORVASTATIN CALCIUM) 1 once daily  #90 Tablet x 5   Entered and Authorized by:   Gordy Savers  MD   Signed by:   Gordy Savers  MD on 08/23/2008   Method used:   Print then Give to Patient   RxID:   [id number]

## 2010-04-07 NOTE — Progress Notes (Signed)
Summary: ? diagnosis of HT  Phone Note Call from Patient   Caller: Patient Call For: Dr. Kirtland Bouchard Summary of Call: Pt is applying for life insurance, and he needs to know if he officially has been diagnosed with HT in his medical records. [phone] Initial call taken by: Lynann Beaver CMA,  August 17, 2007 9:49 AM    Yes, the patient is being treated for hypertension     Appended Document: ? diagnosis of HT Pt. notified

## 2010-04-07 NOTE — Consult Note (Signed)
[facility]   Imported By: Kassie Mends 06/12/2007 09:40:42  _____________________________________________________________________  External Attachment:    Type:   Image     Comment:   [facility]

## 2010-04-07 NOTE — Letter (Signed)
Summary: [facility]  [facility]   Imported By: Maryln Gottron 05/23/2009 13:41:47  _____________________________________________________________________  External Attachment:    Type:   Image     Comment:   External Document

## 2010-04-07 NOTE — Letter (Signed)
Summary: [facility]  [facility]   Imported By: Maryln Gottron 02/07/2008 13:41:49  _____________________________________________________________________  External Attachment:    Type:   Image     Comment:   External Document

## 2010-04-07 NOTE — Assessment & Plan Note (Signed)
Summary: 6 month rov/njr reschedule with [name] from bump/[facility]   Vital Signs:  [name] Profile:   60 Years Old Male Height:     74 inches (187.96 cm) Weight:      242 pounds Temp:     98.4 degrees F oral BP sitting:   110 / 78  (left arm) Cuff size:   regular  Vitals Entered By: Raechel Ache, RN (February 08, 2008 Christopher:37 AM)                 Chief Complaint:  6 mo ROV.Marland Kitchen  History of Present Illness: Christopher [name] seen today for follow up of his hypertension.  Medical problems include dyslipidemia, gastroesophageal reflux disease.  He is followed closely by urology and recent PSA was nondetectable.  He is doing well.  No concerns or complaints    Current Allergies: No known allergies   Past Medical History:    Reviewed history from 08/01/2007 and no changes required:       GERD       Hyperlipidemia       Benign prostatic hypertrophy       Hypertension       prostate cancer  Past Surgical History:    Reviewed history from 08/01/2007 and no changes required:       Tonsillectomy       sigmoidoscopy 2004       prostatectomy  2009   Family History:    Reviewed history from 08/01/2007 and no changes required:       father died age 95, prostate cancer       mother late 75s history of chronic atrial fibrillation; CVD              Two brothers in good health  Social History:    Reviewed history from 08/01/2007 and no changes required:       Married    Review of Systems  The [name] denies anorexia, fever, weight loss, weight gain, vision loss, decreased hearing, hoarseness, chest pain, syncope, dyspnea on exertion, peripheral edema, prolonged cough, headaches, hemoptysis, abdominal pain, melena, hematochezia, severe indigestion/heartburn, hematuria, incontinence, genital sores, muscle weakness, suspicious skin lesions, transient blindness, difficulty walking, depression, unusual weight change, abnormal bleeding, enlarged lymph nodes, angioedema, breast masses,  and testicular masses.     Physical Exam  General:     Well-developed,well-nourished,in no acute distress; alert,appropriate and cooperative throughout examination;  122/80 Head:     Normocephalic and atraumatic without obvious abnormalities. No apparent alopecia or balding. Eyes:     No corneal or conjunctival inflammation noted. EOMI. Perrla. Funduscopic exam benign, without hemorrhages, exudates or papilledema. Vision grossly normal. Mouth:     Oral mucosa and oropharynx without lesions or exudates.  Teeth in good repair. Neck:     No deformities, masses, or tenderness noted. Lungs:     Normal respiratory effort, chest expands symmetrically. Lungs are clear to auscultation, no crackles or wheezes. Heart:     Normal rate and regular rhythm. S1 and S2 normal without gallop, murmur, click, rub or other extra sounds. Abdomen:     Bowel sounds positive,abdomen soft and non-tender without masses, organomegaly or hernias noted. Msk:     No deformity or scoliosis noted of thoracic or lumbar spine.   Pulses:     R and L carotid,radial,femoral,dorsalis pedis and posterior tibial pulses are full and equal bilaterally Extremities:     No clubbing, cyanosis, edema, or deformity noted with normal full range of motion  of all joints.      Impression & Recommendations:  Problem # 1:  HYPERTENSION (ICD-401.9)  His updated medication list for this problem includes:    Benazepril-hydrochlorothiazide 20-12.5 Mg Tabs (Benazepril-hydrochlorothiazide) ..... One daily   Problem # 2:  HYPERLIPIDEMIA (ICD-272.4)  His updated medication list for this problem includes:    Lipitor 40 Mg Tabs (Atorvastatin calcium) .Marland Kitchen... 1 once daily   Complete Medication List: 1)  Lipitor 40 Mg Tabs (Atorvastatin calcium) .Marland Kitchen.. 1 once daily 2)  Adult Aspirin Ec Low Strength 81 Mg Tbec (Aspirin) .Marland Kitchen.. 1 once daily 3)  Alprazolam 0.5 Mg Tabs (Alprazolam) .... One twice daily as needed 4)   Benazepril-hydrochlorothiazide 20-12.5 Mg Tabs (Benazepril-hydrochlorothiazide) .... One daily   [name] Instructions: 1)  Please schedule a follow-up appointment in 6 months. 2)  Limit your Sodium (Salt). 3)  It is important that you exercise regularly at least 20 minutes 5 times a week. If you develop chest pain, have severe difficulty breathing, or feel very tired , stop exercising immediately and seek medical attention. 4)  You need to lose weight. Consider a lower calorie diet and regular exercise.  5)  Check your Blood Pressure regularly. If it is above: you should make an appointment.   Prescriptions: BENAZEPRIL-HYDROCHLOROTHIAZIDE 20-12.5 MG  TABS (BENAZEPRIL-HYDROCHLOROTHIAZIDE) one daily  #90 x 6   Entered and Authorized by:   Gordy Savers  MD   Signed by:   Gordy Savers  MD on 02/08/2008   Method used:   Print then Give to [name]   RxID:   [id number] ALPRAZOLAM 0.5 MG  TABS (ALPRAZOLAM) one twice daily as needed  #100 x 2   Entered and Authorized by:   Gordy Savers  MD   Signed by:   Gordy Savers  MD on 02/08/2008   Method used:   Print then Give to [name]   RxID:   [id number] LIPITOR 40 MG  TABS (ATORVASTATIN CALCIUM) 1 once daily  #90 x 6   Entered and Authorized by:   Gordy Savers  MD   Signed by:   Gordy Savers  MD on 02/08/2008   Method used:   Print then Give to [name]   RxID:   [id number]  ]

## 2010-04-09 NOTE — Letter (Signed)
Summary: [facility] Instructions  [facility]  [address] [place], [place] [address]   Phone: [phone]  Fax: [phone]       Christopher [name]    10-13-1950    MRN: [id number]        Procedure Day Dorna Bloom: Wednesday 03-18-10     Arrival Time: 7:30 am     Procedure Time: 8:30 am     Location of Procedure:                    _x _  [facility] (4th Floor)                        PREPARATION FOR COLONOSCOPY WITH MOVIPREP   Starting 5 days prior to your procedure  03-13-10  do not eat nuts, seeds, popcorn, corn, beans, peas,  salads, or any raw vegetables.  Do not take any fiber supplements (e.g. Metamucil, Citrucel, and Benefiber).  THE DAY BEFORE YOUR PROCEDURE         DATE:  03-17-10  DAY:  Tuesday   1.  Drink clear liquids the entire day-NO SOLID FOOD  2.  Do not drink anything colored red or purple.  Avoid juices with pulp.  No orange juice.  3.  Drink at least 64 oz. (8 glasses) of fluid/clear liquids during the day to prevent dehydration and help the prep work efficiently.  CLEAR LIQUIDS INCLUDE: Water Jello Ice Popsicles Tea (sugar ok, no milk/cream) Powdered fruit flavored drinks Coffee (sugar ok, no milk/cream) Gatorade Juice: apple, white grape, white cranberry  Lemonade Clear bullion, consomm, broth Carbonated beverages (any kind) Strained chicken noodle soup Hard Candy                             4.  In the morning, mix first dose of MoviPrep solution:    Empty 1 Pouch A and 1 Pouch B into the disposable container    Add lukewarm drinking water to the top line of the container. Mix to dissolve    Refrigerate (mixed solution should be used within 24 hrs)  5.  Begin drinking the prep at 5:00 p.m. The MoviPrep container is divided by 4 marks.   Every 15 minutes drink the solution down to the next mark (approximately 8 oz) until the full liter is complete.   6.  Follow completed prep with 16 oz of clear liquid of your choice  (Nothing red or purple).  Continue to drink clear liquids until bedtime.  7.  Before going to bed, mix second dose of MoviPrep solution:    Empty 1 Pouch A and 1 Pouch B into the disposable container    Add lukewarm drinking water to the top line of the container. Mix to dissolve    Refrigerate  THE DAY OF YOUR PROCEDURE      DATE:  03-18-10  DAY:  Wednesday  Beginning at  3:30 a.m. (5 hours before procedure):         1. Every 15 minutes, drink the solution down to the next mark (approx 8 oz) until the full liter is complete.  2. Follow completed prep with 16 oz. of clear liquid of your choice.    3. You may drink clear liquids until  6:30 a.m.  (2 HOURS BEFORE PROCEDURE).   MEDICATION INSTRUCTIONS  Unless otherwise instructed, you should take regular prescription medications with a small sip  of water   as early as possible the morning of your procedure.   Additional medication instructions:   Hold Benazepril/HCTZ the morning of procedure.         OTHER INSTRUCTIONS  You will need a responsible adult at least 60 years of age to accompany you and drive you home.   This person must remain in the waiting room during your procedure.  Wear loose fitting clothing that is easily removed.  Leave jewelry and other valuables at home.  However, you may wish to bring a book to read or  an iPod/MP3 player to listen to music as you wait for your procedure to start.  Remove all body piercing jewelry and leave at home.  Total time from sign-in until discharge is approximately 2-3 hours.  You should go home directly after your procedure and rest.  You can resume normal activities the  day after your procedure.  The day of your procedure you should not:   Drive   Make legal decisions   Operate machinery   Drink alcohol   Return to work  You will receive specific instructions about eating, activities and medications before you leave.    The above instructions have been  reviewed and explained to me by   Wyona Almas RN  March 11, 2010 2:13 PM      I fully understand and can verbalize these instructions _____________________________ Date _________

## 2010-04-09 NOTE — Procedures (Signed)
Summary: Colonoscopy  Patient: [name] Note: All result statuses are Final unless otherwise noted.  Tests: (1) Colonoscopy (COL)   COL Colonoscopy           DONE     [facility]     [address]     [place], [place]  [address]           COLONOSCOPY PROCEDURE REPORT           PATIENT:  Christopher [name], Christopher [name]  MR#:  [id number]     BIRTHDATE:  [birth year], 59 yrs. old  GENDER:  male     ENDOSCOPIST:  Wilhemina Bonito. Eda Keys, MD     REF. BY:  Eleonore Chiquito, M.D.     PROCEDURE DATE:  03/18/2010     PROCEDURE:  Colonoscopy with snare polypectomy x 1     ASA CLASS:  Class II     INDICATIONS:  Routine Risk Screening     MEDICATIONS:   Fentanyl 100 mcg IV, Versed 10 mg IV           DESCRIPTION OF PROCEDURE:   After the risks benefits and     alternatives of the procedure were thoroughly explained, informed     consent was obtained.  Digital rectal exam was performed and     revealed no abnormalities.   The LB CF-[id number] endoscope     was introduced through the anus and advanced to the cecum, which     was identified by both the appendix and ileocecal valve, without     limitations.Time to cecum = 5:14 min.  The quality of the prep was     excellent, using MoviPrep.  The instrument was then slowly     withdrawn (time = 16:10 min) as the colon was fully examined.     <<PROCEDUREIMAGES>>           FINDINGS:  A diminutive polyp was found in the ascending colon.     Polyp was snared without cautery. Retrieval was successful.   Mild     diverticulosis was found in the sigmoid colon.  Otherwise normal     colonoscopy without other polyps, masses, vascular ectasias, or     inflammatory changes.   Retroflexed views in the rectum revealed     internal hemorrhoids.    The scope was then withdrawn from the     patient and the procedure completed.           COMPLICATIONS:  None     ENDOSCOPIC IMPRESSION:     1) Diminutive polyp in the ascending colon - removed     2) Mild diverticulosis  in the sigmoid colon     3) Otherwise normal colonoscopy     4) Internal hemorrhoids           RECOMMENDATIONS:     1) Repeat colonoscopy in 5 years if polyp adenomatous; otherwise     10 years           ______________________________     Wilhemina Bonito. Eda Keys, MD           CC:  Eleonore Chiquito, M.D., The Patient           n.     eSIGNED:   Wilhemina Bonito. Eda Keys at 03/18/2010 09:49 AM           Paulla Fore, [id number]  Note: An exclamation mark (!) indicates a result that was not dispersed into the flowsheet. Document  Creation Date: 03/18/2010 9:50 AM _______________________________________________________________________  (1) Order result status: Final Collection or observation date-time: 03/18/2010 09:41 Requested date-time:  Receipt date-time:  Reported date-time:  Referring Physician:   Ordering Physician: Fransico Setters [phone]) Specimen Source:  Source: Launa Grill Order Number: [id number] Lab site:   Appended Document: Colonoscopy recall 5 yrs     Procedures Next Due Date:    Colonoscopy: 03/2015

## 2010-04-09 NOTE — Miscellaneous (Signed)
Summary: [facility] Previsit/prep  Clinical Lists Changes  Medications: Added new medication of MOVIPREP 100 GM  SOLR (PEG-KCL-NACL-NASULF-NA ASC-C) As per prep instructions. - Signed Rx of MOVIPREP 100 GM  SOLR (PEG-KCL-NACL-NASULF-NA ASC-C) As per prep instructions.;  #1 x 0;  Signed;  Entered by: Wyona Almas RN;  Authorized by: Hilarie Fredrickson MD;  Method used: Electronically to [facility] [phone]*, [address], [place], [place], [place]  [address], Ph: [phone], Fax: [phone] Observations: Added new observation of NKA: T (03/11/2010 13:15)    Prescriptions: MOVIPREP 100 GM  SOLR (PEG-KCL-NACL-NASULF-NA ASC-C) As per prep instructions.  #1 x 0   Entered by:   Wyona Almas RN   Authorized by:   Hilarie Fredrickson MD   Signed by:   Wyona Almas RN on 03/11/2010   Method used:   Electronically to        [facility] [phone][facility] (retail)       [address]       [place], [place]  [address]       Ph: [phone]       Fax: [phone]   RxID:   [id number]

## 2010-04-09 NOTE — Letter (Signed)
Summary: Patient Notice- Polyp Results  [facility]  [address] [place], [place] [address]   Phone: [phone]  Fax: [phone]        March 24, 2010 MRN: [id number]    [facility] [address] [place], [place]  [address]    Dear Mr. Chilton,  I am pleased to inform you that the colon polyp(s) removed during your recent colonoscopy was (were) found to be benign (no cancer detected) upon pathologic examination.  I recommend you have a repeat colonoscopy examination in 5 years to look for recurrent polyps, as having colon polyps increases your risk for having recurrent polyps or even colon cancer in the future.  Should you develop new or worsening symptoms of abdominal pain, bowel habit changes or bleeding from the rectum or bowels, please schedule an evaluation with either your primary care physician or with me.  Additional information/recommendations:  __ No further action with gastroenterology is needed at this time. Please      follow-up with your primary care physician for your other healthcare      needs.   Please call us if you are having persistent problems or have questions about your condition that have not been fully answered at this time.  Sincerely,  Hilarie Fredrickson MD  This letter has been electronically signed by your physician.  Appended Document: Patient Notice- Polyp Results Letter mailed

## 2010-04-10 NOTE — Letter (Signed)
Summary: [facility]   [facility]   Imported By: Maryln Gottron 11/21/2008 09:59:36  _____________________________________________________________________  External Attachment:    Type:   Image     Comment:   External Document

## 2010-07-21 NOTE — H&P (Signed)
NAME:  Christopher [name], Christopher [name]                 ACCOUNT NO.:  [id number]   MEDICAL RECORD NO.:  [id number]          PATIENT TYPE:  INP   LOCATION:  NA                           FACILITY:  [facility]   PHYSICIAN:  Heloise Purpura, MD      DATE OF BIRTH:  October 09, 1950   DATE OF ADMISSION:  04/20/2007  DATE OF DISCHARGE:                              HISTORY & PHYSICAL   CHIEF COMPLAINT:  Prostate cancer.   HISTORY:  [name] is a 60 year old gentleman with clinical stage T1C  prostate cancer with a PSA of 5.27 and Gleason score 3+4=7. After  discussion regarding management options for treatment, he elected to  proceed with surgical therapy and a robotic prostatectomy.   PAST MEDICAL HISTORY:  1. Gastroesophageal reflux disease.  2. Hypercholesterolemia.   PAST SURGICAL HISTORY:  1. Laparoscopic Nissen fundoplication.  2. Tonsillectomy.   MEDICATIONS:  1. Aspirin.  2. Lipitor.   ALLERGIES:  No known drug allergies.   FAMILY HISTORY:  There is a maternal history of hypercholesterolemia and  atrial fibrillation.  There is a paternal history of prostate cancer and  the patient's father did actually die of prostate cancer at age 28 after  having refused early treatment.   SOCIAL HISTORY:  He denies alcohol or tobacco use.   REVIEW OF SYSTEMS:  A complete review of systems was performed and all  systems are negative.   PHYSICAL EXAM:  CONSTITUTIONAL:  Alert and oriented in no acute  distress.  CARDIOVASCULAR:  Regular rate and rhythm without obvious murmurs.  LUNGS:  Clear bilaterally.  ABDOMEN:  Soft, nontender with well-healed upper abdominal laparoscopic  incisions.  DRE:  No prostate nodularity   IMPRESSION:  Clinically localized adenocarcinoma of the prostate.   PLAN:  [name] will undergo a robotic assisted laparoscopic radical  prostatectomy and then be admitted to the hospital for routine  postoperative care.      Heloise Purpura, MD  Electronically Signed     LB/MEDQ  D:   04/20/2007  T:  04/21/2007  Job:  [id number]

## 2010-07-21 NOTE — Op Note (Signed)
NAME:  Christopher [name], Christopher [name]                 ACCOUNT NO.:  [id number]   MEDICAL RECORD NO.:  [id number]          PATIENT TYPE:  INP   LOCATION:  0007                         FACILITY:  [facility]   PHYSICIAN:  Heloise Purpura, MD      DATE OF BIRTH:  1950-06-24   DATE OF PROCEDURE:  04/20/2007  DATE OF DISCHARGE:                               OPERATIVE REPORT   PREOPERATIVE DIAGNOSIS:  Clinically localized adenocarcinoma of  prostate.   POSTOPERATIVE DIAGNOSIS:  Clinically localized adenocarcinoma of  prostate.   PROCEDURE:  1. Robotic assisted laparoscopic radical prostatectomy (bilateral      partial nerve sparing).  2. Bilateral laparoscopic pelvic lymphadenectomy.   SURGEON:  Heloise Purpura, M.D.   ANESTHESIA:  General.   ASSISTANT:  Dr. Georgeanna Lea   COMPLICATIONS:  None.   ESTIMATED BLOOD LOSS:  200 mL.   FLUIDS REPLACED:  2000 mL of lactated Ringer's   SPECIMENS:  1. Prostate and seminal vesicles.  2. Right pelvic lymph nodes.  3. Left pelvic lymph nodes.   DISPOSITION OF SPECIMEN:  To pathology.   DRAINS:  1. 20 French Coude catheter.  2. #19 Blake pelvic drain.   INDICATIONS:  [name] is a 60 year old gentleman with clinically  localized adenocarcinoma of the prostate.  After a detailed discussion  regarding management options for treatment, he elected to proceed with  surgical therapy and the above procedures.  The potential  risks/benefits, potential complications, and alternative options were  discussed with the patient in detail and informed consent was obtained.   DESCRIPTION OF PROCEDURE:  The patient was taken to the operating room  and a general anesthetic was administered.  He was given preoperative  antibiotics, placed in the dorsal lithotomy position, and prepped and  draped in the usual sterile fashion.  Next, a preoperative time out was  performed.  A Foley catheter was placed in the bladder.  A site was then  selected just to the left of the  umbilicus for placement of the camera  port.  This was placed using a standard open Hassan technique.  This  allowed entry into the peritoneal cavity under direct vision and without  difficulty. A 12 mm port was then placed and a pneumoperitoneum was  established.  The 0 degrees lens was used to inspect the abdomen and  there was no evidence of any intra-abdominal injuries or other  abnormalities.  Attention was then turned to placement of the remaining  ports.  8 mm ports were placed approximately 10 cm lateral to and just  inferior to the camera port site.  A 5 mm port was placed between the  camera port and the right robotic port.  An additional 12 mm port was  placed in the far right lateral abdominal wall and an additional 8 mm  port was placed in the far left lateral abdominal wall.  All ports were  placed under direct vision and without difficulty.  The surgical cart  was then docked. The bladder was then reflected posteriorly with the aid  of cautery scissors allowing entry into space of  Retzius and  identification of the endopelvic fascia and prostate.  The endopelvic  fascia was incised from the apex back to the base of the prostate  bilaterally and the underlying levator muscle fibers were swept  laterally off the prostate, thereby isolating the dorsal venous complex.  The dorsal venous complex was then stapled and divided with a 45 mm flex  ETS stapler.  The bladder neck was identified with the aid of Foley  catheter manipulation and was divided anteriorly allowing entry into the  bladder and identification of the Foley catheter.  The catheter balloon  was deflated and the catheter was brought into the operative field and  used to retract the prostate anteriorly.  Dissection then continued  posteriorly between the bladder and prostate until the vasa deferentia  and seminal vesicles were identified.  The vasa deferentia were  isolated, divided and lifted anteriorly.  The seminal  vesicles were  dissected down to their tips with care to control the seminal vesicle  arterial blood supply.  The space between Denonvilliers' fascia and the  anterior rectum was then bluntly developed thereby isolating the  vascular pedicles of the prostate.  The lateral prostatic fascia was  released bilaterally and the vascular pedicles of the prostate were  ligated with Hem-A-Lock clips.  Dissection proceeded toward the apex of  the prostate.  Care was taken not to get too close to the prostate and a  partial nerve sparing procedure was performed on each side.  Once the  urethra was isolated, it was then sharply divided allowing the prostate  specimen to be disarticulated.  The pelvis was then copiously irrigated  and hemostasis was ensured.  The pelvis was then irrigated with  irrigation in the pelvis, air was injected into the rectal catheter and  there was no evidence of a rectal injury.  Attention was then turned to  the right pelvic sidewall.  The fibrofatty tissue between the external  iliac vein, confluence of the iliac vessels, hypogastric artery, and  Cooper's ligament was dissected free from the pelvic sidewall with care  to preserve the obturator nerve.  Hem-A-Lock clips were used for  lymphostasis and hemostasis.  An identical procedure was then performed  on contralateral side.  Both lymph node packets were passed off for  permanent pathologic analysis.  Attention was then returned to the  pelvis and to the urethral anastomosis.  A 2-0 Vicryl slip-knot was  placed between Denonvilliers' fascia, the posterior bladder neck, and  the posterior urethra to reapproximate these structures.  A double armed  3-0 Monocryl suture was then used perform a 360 degrees running tension  free anastomosis between the bladder neck and urethra.  A new 20-French  Coude catheter was inserted into the bladder and irrigated.  There were  no blood clots within the bladder and the anastomosis  appeared to be  watertight.  At this point, there was noted to be some troublesome  bleeding from the right apical dissection near the GU diaphragm.  In  order to control this bleeding, it was decided to place FloSeal in this  area which did result in adequate hemostasis.  A #19 Blake drain was  then brought through the left robotic port and appropriately positioned  in the pelvis.  It was secured to the skin with a nylon suture.  The  surgical cart was then undocked.  The right lateral 12 mm port site was  closed with a 0 Vicryl suture placed with the aid  of the [employer]  device.  All remaining ports were removed under direct vision.  The  prostate specimen was removed intact within the Endopouch retrieval bag.  This fascial opening was closed with a running 0 Vicryl suture and the  skin incisions were injected with 0.25% Marcaine reapproximated at the  skin level with staples.  Sterile dressings were applied.  The patient  appeared to tolerate the procedure well and without complications.  He  was able to be extubated and transferred to the recovery unit in  satisfactory condition.     Heloise Purpura, MD  Electronically Signed    LB/MEDQ  D:  04/20/2007  T:  04/22/2007  Job:  [id number]

## 2010-07-21 NOTE — Discharge Summary (Signed)
NAMEBENZ, VANDENBERGHE                 ACCOUNT NO.:  [id number]   MEDICAL RECORD NO.:  [id number]          PATIENT TYPE:  INP   LOCATION:  1426                         [facility]ACILITY:  [facility]   PHYSICIAN:  Heloise Purpura, MD      DATE OF BIRTH:  1951-02-28   DATE OF ADMISSION:  04/20/2007  DATE OF DISCHARGE:  04/21/2007                               DISCHARGE SUMMARY   ADMISSION DIAGNOSIS:  Prostate cancer.   DISCHARGE DIAGNOSIS:  Prostate cancer.   HISTORY AND PHYSICAL:  For full details, please see admission history  and physical.  Briefly, Mr. Buening is a 60 year old gentleman with  clinically localized adenocarcinoma of the prostate.  After a discussion  regarding management options for treatment, he elected to proceed with  surgical therapy and a robotic prostatectomy.   HOSPITAL COURSE:  On April 20, 2007, the patient was taken to the  operating room and underwent a robotic-assisted laparoscopic radical  prostatectomy and bilateral pelvic lymphadenectomy.  He tolerated this  procedure well and without complications.  Postoperatively, he was able  to be transferred to a regular hospital room following recovery from  anesthesia.  He was able to begin ambulating the night of surgery.  On  the morning of postoperative day #1, he maintained excellent urine  output with minimal output from his pelvic drain and his pelvic drain  was able to be removed.  He remained hemodynamically stable overnight  and his hematocrit was 36.1 on postoperative day #1, which indicated no  evidence of bleeding.  He was advanced to a clear liquid diet, which he  tolerated, and was transitioned to oral pain medication.  He was able to  be discharged home in excellent condition on the afternoon of  postoperative day #1.   DISPOSITION:  Home.   DISCHARGE MEDICATIONS:  He was instructed to resume his regular home  medications excepting any aspirin, nonsteroidal anti-inflammatory drugs,  or herbal supplements.  He  was given a prescription to take Vicodin as  needed for pain and to begin Cipro 1 day prior his return visit for  Foley catheter removal.   DISCHARGE INSTRUCTIONS:  He was instructed to be ambulatory, but  specifically told to refrain from any heavy lifting, strenuous activity,  or driving.  He was instructed on routine Foley catheter care and given  a leg bag for daytime usage.  He was also instructed to gradually  advance his diet over the course of the next few days.   FOLLOWUP:  Mr. Pollard will follow up in 1 week for removal of his Foley  catheter and to discuss his surgical pathology in detail.      Heloise Purpura, MD  Electronically Signed     LB/MEDQ  D:  04/21/2007  T:  04/24/2007  Job:  [id number]

## 2010-07-21 NOTE — Assessment & Plan Note (Signed)
[facility]                            [facility] OFFICE NOTE   NAME:Christopher [name], Christopher [name]                        MRN:          [id number]  DATE:07/26/2006                            DOB:          03/31/1950    A 60 year old gentleman see today for an annual exam. He has  hypercholesterolemia, history of gastroesophageal reflux disease. He is  status post Nissen fundoplication  in 2001. He had a remote  tonsillectomy in 2002.   REVIEW OF SYSTEMS:  Fairly unremarkable. He did have a screening  sigmoidoscopy in 2004.   FAMILY HISTORY:  Father died at 21 with complications of prostate  cancer. Mother with a history of atrial fibrillation. Two brothers are  well.   PHYSICAL EXAMINATION:  GENERAL:  Revealed a healthy-appearing male in no  acute distress.  VITAL SIGNS:  Blood pressure was 150/90, weight up to 234.  HEENT:  Fundi, ear, nose and throat clear.  NECK:  No bruits or adenopathy.  CHEST:  Clear.  CARDIOVASCULAR:  Normal heart sounds, no murmurs.  ABDOMEN:  Obese, soft and nontender. No organomegaly. No bruits  appreciated.  EXTERNAL GENITALIA:  Normal.  RECTAL:  Prostate was +2 enlarged. There did appear to be some asymmetry  to the prostate with the left lobe more prominent.  NEUROLOGIC:  Negative.   IMPRESSION:  1. Hypercholesterolemia.  2. Benign prostatic hypertrophy.  3. History of gastroesophageal reflux disease.   DISPOSITION:  Will continue his present regimen. He has been asked to  track outpatient blood pressure readings. Recheck here in 1 year or  p.r.n.     Gordy Savers, MD  Electronically Signed    PFK/MedQ  DD: 07/26/2006  DT: 07/26/2006  Job #: [id number]

## 2010-08-14 ENCOUNTER — Telehealth: Payer: Self-pay

## 2010-08-14 NOTE — Telephone Encounter (Signed)
Received a fax for alprazolam 0.5mg  #60 3rf--pls advise

## 2010-08-17 MED ORDER — ALPRAZOLAM 0.5 MG PO TABS: 0.5000 mg | ORAL_TABLET | Freq: Two times a day (BID) | ORAL | Status: DC | PRN

## 2010-08-17 NOTE — Telephone Encounter (Signed)
ok 

## 2010-11-27 LAB — CBC
HCT: 43.8
Hemoglobin: 15.3
MCHC: 34.9
MCV: 86.2
Platelets: 190
RBC: 5.09
RDW: 12.8
WBC: 8.7

## 2010-11-27 LAB — BASIC METABOLIC PANEL
BUN: 13
CO2: 27
Calcium: 9.5
Chloride: 104
Creatinine, Ser: 0.84
GFR calc Af Amer: >60
GFR calc non Af Amer: >60
Glucose, Bld: 90
Potassium: 4.3
Sodium: 139

## 2010-11-27 LAB — TYPE AND SCREEN
ABO/RH(D): O POS
Antibody Screen: NEGATIVE

## 2010-11-27 LAB — HEMOGLOBIN AND HEMATOCRIT, BLOOD
HCT: 36.1 — ABNORMAL LOW
HCT: 37.7 — ABNORMAL LOW
Hemoglobin: 12.6 — ABNORMAL LOW
Hemoglobin: 13.2

## 2010-11-27 LAB — ABO/RH: ABO/RH(D): O POS

## 2011-01-14 ENCOUNTER — Other Ambulatory Visit: Payer: Self-pay | Admitting: Internal Medicine

## 2011-01-14 MED ORDER — ALPRAZOLAM 0.5 MG PO TABS: 0.5000 mg | ORAL_TABLET | Freq: Two times a day (BID) | ORAL | Status: DC | PRN

## 2011-01-14 NOTE — Telephone Encounter (Signed)
Refill Alprazolam #60. Thanks.

## 2011-01-14 NOTE — Telephone Encounter (Signed)
Called into [facility] -[facility] x1 - last seen 01/2010

## 2011-01-22 ENCOUNTER — Other Ambulatory Visit (INDEPENDENT_AMBULATORY_CARE_PROVIDER_SITE_OTHER): Payer: PRIVATE HEALTH INSURANCE

## 2011-01-22 DIAGNOSIS — Z0000 Encounter for general adult medical examination without abnormal findings: Secondary | ICD-10-CM

## 2011-01-22 LAB — HEPATIC FUNCTION PANEL
ALT: 32 U/L (ref 0–53)
AST: 23 U/L (ref 0–37)
Albumin: 4.2 g/dL (ref 3.5–5.2)
Alkaline Phosphatase: 98 U/L (ref 39–117)
Bilirubin, Direct: 0.1 mg/dL (ref 0.0–0.3)
Total Bilirubin: 0.7 mg/dL (ref 0.3–1.2)
Total Protein: 6.5 g/dL (ref 6.0–8.3)

## 2011-01-22 LAB — CBC WITH DIFFERENTIAL/PLATELET
Basophils Absolute: 0 10*3/uL (ref 0.0–0.1)
Basophils Relative: 0.5 % (ref 0.0–3.0)
Eosinophils Absolute: 0.1 10*3/uL (ref 0.0–0.7)
Eosinophils Relative: 1.1 % (ref 0.0–5.0)
HCT: 44.6 % (ref 39.0–52.0)
Hemoglobin: 15.1 g/dL (ref 13.0–17.0)
Lymphocytes Relative: 25.7 % (ref 12.0–46.0)
Lymphs Abs: 2 10*3/uL (ref 0.7–4.0)
MCHC: 33.8 g/dL (ref 30.0–36.0)
MCV: 90 fl (ref 78.0–100.0)
Monocytes Absolute: 0.5 10*3/uL (ref 0.1–1.0)
Monocytes Relative: 6.7 % (ref 3.0–12.0)
Neutro Abs: 5.1 10*3/uL (ref 1.4–7.7)
Neutrophils Relative %: 66 % (ref 43.0–77.0)
Platelets: 155 10*3/uL (ref 150.0–400.0)
RBC: 4.96 Mil/uL (ref 4.22–5.81)
RDW: 13.6 % (ref 11.5–14.6)
WBC: 7.7 10*3/uL (ref 4.5–10.5)

## 2011-01-22 LAB — POCT URINALYSIS DIPSTICK
Bilirubin, UA: NEGATIVE
Blood, UA: NEGATIVE
Glucose, UA: NEGATIVE
Ketones, UA: NEGATIVE
Leukocytes, UA: NEGATIVE
Nitrite, UA: NEGATIVE
Protein, UA: NEGATIVE
Spec Grav, UA: 1.02
Urobilinogen, UA: 0.2
pH, UA: 7

## 2011-01-22 LAB — BASIC METABOLIC PANEL
BUN: 19 mg/dL (ref 6–23)
CO2: 27 mEq/L (ref 19–32)
Calcium: 9.2 mg/dL (ref 8.4–10.5)
Chloride: 104 mEq/L (ref 96–112)
Creatinine, Ser: 0.9 mg/dL (ref 0.4–1.5)
GFR: 90.17 mL/min (ref >60.00)
Glucose, Bld: 79 mg/dL (ref 70–99)
Potassium: 4.4 mEq/L (ref 3.5–5.1)
Sodium: 138 mEq/L (ref 135–145)

## 2011-01-22 LAB — LIPID PANEL
Cholesterol: 145 mg/dL (ref 0–200)
HDL: 50.6 mg/dL (ref >39.00)
LDL Cholesterol: 73 mg/dL (ref 0–99)
Total CHOL/HDL Ratio: 3
Triglycerides: 105 mg/dL (ref 0.0–149.0)
VLDL: 21 mg/dL (ref 0.0–40.0)

## 2011-01-25 ENCOUNTER — Encounter: Payer: Self-pay | Admitting: Internal Medicine

## 2011-01-25 ENCOUNTER — Encounter: Payer: Self-pay | Admitting: *Deleted

## 2011-01-25 LAB — TSH: TSH: 1.29 u[IU]/mL (ref 0.35–5.50)

## 2011-01-25 LAB — PSA: PSA: 0 ng/mL — ABNORMAL LOW (ref 0.10–4.00)

## 2011-02-05 ENCOUNTER — Encounter: Payer: Self-pay | Admitting: Internal Medicine

## 2011-02-05 ENCOUNTER — Ambulatory Visit (INDEPENDENT_AMBULATORY_CARE_PROVIDER_SITE_OTHER): Payer: PRIVATE HEALTH INSURANCE | Admitting: Internal Medicine

## 2011-02-05 DIAGNOSIS — I10 Essential (primary) hypertension: Secondary | ICD-10-CM

## 2011-02-05 DIAGNOSIS — Z23 Encounter for immunization: Secondary | ICD-10-CM

## 2011-02-05 DIAGNOSIS — Z136 Encounter for screening for cardiovascular disorders: Secondary | ICD-10-CM

## 2011-02-05 DIAGNOSIS — Z0000 Encounter for general adult medical examination without abnormal findings: Secondary | ICD-10-CM

## 2011-02-05 MED ORDER — BENAZEPRIL-HYDROCHLOROTHIAZIDE 20-12.5 MG PO TABS: 1.0000 | ORAL_TABLET | Freq: Every day | ORAL | Status: DC

## 2011-02-05 MED ORDER — ALPRAZOLAM 0.5 MG PO TABS: 0.5000 mg | ORAL_TABLET | Freq: Two times a day (BID) | ORAL | Status: DC | PRN

## 2011-02-05 MED ORDER — ATORVASTATIN CALCIUM 40 MG PO TABS: 40.0000 mg | ORAL_TABLET | Freq: Every day | ORAL | Status: DC

## 2011-02-05 NOTE — Progress Notes (Signed)
  Subjective:    Patient ID: Christopher [name], male    DOB: 24-Oct-1950, 60 y.o.   MRN: [id number]  HPI  Wt Readings from Last 3 Encounters:  02/05/11 243 lb (110.224 kg)  02/02/10 260 lb (117.935 kg)  08/23/08 247 lb (112.038 kg)    Review of Systems     Objective:   Physical Exam        Assessment & Plan:

## 2011-02-05 NOTE — Patient Instructions (Signed)
Limit your sodium (Salt) intake    It is important that you exercise regularly, at least 20 minutes 3 to 4 times per week.  If you develop chest pain or shortness of breath seek  medical attention.  You need to lose weight.  Consider a lower calorie diet and regular exercise.  Return in one year for follow-up 

## 2011-02-05 NOTE — Progress Notes (Signed)
Subjective:    Patient ID: Christopher [name], male    DOB: 03-Oct-1950, 60 y.o.   MRN: [id number]  HPI History of Present Illness:   60 -year-old patient seen today for a wellness exam. Medical problems include hypertension, dyslipidemia, and history of prostate cancer. He is doing quite well. He is followed closely by urology.   Preventive Screening-Counseling & Management  Alcohol-Tobacco  Smoking Status: quit   Allergies (verified):  No Known Drug Allergies   Past History:  Past Medical History:  Reviewed history from 08/23/2008 and no changes required.  GERD  Hyperlipidemia  Benign prostatic hypertrophy, history of  Hypertension  prostate cancer   Past Surgical History:   Reviewed history from 08/23/2008 and no changes required.  Tonsillectomy  sigmoidoscopy 2004  Colonoscopy 2011 prostatectomy February/2009   Family History:  Reviewed history from 08/01/2007 and no changes required.  father died age 18, prostate cancer  mother  died 32 history of chronic atrial fibrillation; CVD  Two brothers in good health; s/p benighn brain tumor   Social History:  Reviewed history from 08/01/2007 and no changes required.  Married     Review of Systems  Constitutional: Negative for fever, chills, activity change, appetite change and fatigue.  HENT: Negative for hearing loss, ear pain, congestion, rhinorrhea, sneezing, mouth sores, trouble swallowing, neck pain, neck stiffness, dental problem, voice change, sinus pressure and tinnitus.   Eyes: Negative for photophobia, pain, redness and visual disturbance.  Respiratory: Negative for apnea, cough, choking, chest tightness, shortness of breath and wheezing.   Cardiovascular: Negative for chest pain, palpitations and leg swelling.  Gastrointestinal: Negative for nausea, vomiting, abdominal pain, diarrhea, constipation, blood in stool, abdominal distention, anal bleeding and rectal pain.  Genitourinary: Negative for dysuria, urgency,  frequency, hematuria, flank pain, decreased urine volume, discharge, penile swelling, scrotal swelling, difficulty urinating, genital sores and testicular pain.  Musculoskeletal: Negative for myalgias, back pain, joint swelling, arthralgias and gait problem.  Skin: Negative for color change, rash and wound.  Neurological: Negative for dizziness, tremors, seizures, syncope, facial asymmetry, speech difficulty, weakness, light-headedness, numbness and headaches.  Hematological: Negative for adenopathy. Does not bruise/bleed easily.  Psychiatric/Behavioral: Negative for suicidal ideas, hallucinations, behavioral problems, confusion, sleep disturbance, self-injury, dysphoric mood, decreased concentration and agitation. The patient is not nervous/anxious.        Objective:   Physical Exam  Constitutional: He appears well-developed and well-nourished.  HENT:  Head: Normocephalic and atraumatic.  Right Ear: External ear normal.  Left Ear: External ear normal.  Nose: Nose normal.  Mouth/Throat: Oropharynx is clear and moist.  Eyes: Conjunctivae and EOM are normal. Pupils are equal, round, and reactive to light. No scleral icterus.  Neck: Normal range of motion. Neck supple. No JVD present. No thyromegaly present.  Cardiovascular: Regular rhythm, normal heart sounds and intact distal pulses.  Exam reveals no gallop and no friction rub.   No murmur heard. Pulmonary/Chest: Effort normal and breath sounds normal. He exhibits no tenderness.  Abdominal: Soft. Bowel sounds are normal. He exhibits no distension and no mass. There is no tenderness.  Genitourinary: Penis normal.  Musculoskeletal: Normal range of motion. He exhibits no edema and no tenderness.  Lymphadenopathy:    He has no cervical adenopathy.  Neurological: He is alert. He has normal reflexes. No cranial nerve deficit. Coordination normal.  Skin: Skin is warm and dry. No rash noted.  Psychiatric: He has a normal mood and affect. His  behavior is normal.  Assessment & Plan:   Preventive health examination Hypertension stable Dyslipidemia stable History prostate cancer status post prostatectomy.  We'll continue present regimen his lipid profile is under very nice control we'll continue a efforts at lifestyle. Exercise weight loss all encouraged we'll recheck in one year

## 2011-02-17 ENCOUNTER — Other Ambulatory Visit: Payer: Self-pay | Admitting: Internal Medicine

## 2011-02-26 ENCOUNTER — Other Ambulatory Visit: Payer: Self-pay | Admitting: Internal Medicine

## 2011-04-01 ENCOUNTER — Other Ambulatory Visit: Payer: Self-pay

## 2011-04-01 MED ORDER — ALPRAZOLAM 0.5 MG PO TABS: 0.5000 mg | ORAL_TABLET | Freq: Two times a day (BID) | ORAL | Status: DC | PRN

## 2011-04-01 NOTE — Telephone Encounter (Signed)
Rx called in to pharmacy. 

## 2011-04-01 NOTE — Telephone Encounter (Signed)
#  60  RF 3 

## 2011-04-01 NOTE — Telephone Encounter (Signed)
Rx request for alprazolam 0.5 mg.  Rx last filled 02/05/11.  Pt last seen 02/05/11. Pls advise.

## 2011-08-30 ENCOUNTER — Other Ambulatory Visit: Payer: Self-pay

## 2011-08-30 MED ORDER — ALPRAZOLAM 0.5 MG PO TABS: 0.5000 mg | ORAL_TABLET | Freq: Two times a day (BID) | ORAL | Status: DC | PRN

## 2012-01-07 ENCOUNTER — Other Ambulatory Visit: Payer: Self-pay | Admitting: Internal Medicine

## 2012-01-31 ENCOUNTER — Other Ambulatory Visit (INDEPENDENT_AMBULATORY_CARE_PROVIDER_SITE_OTHER): Payer: PRIVATE HEALTH INSURANCE

## 2012-01-31 DIAGNOSIS — Z0000 Encounter for general adult medical examination without abnormal findings: Secondary | ICD-10-CM

## 2012-01-31 LAB — CBC WITH DIFFERENTIAL/PLATELET
Basophils Absolute: 0 10*3/uL (ref 0.0–0.1)
Basophils Relative: 0.4 % (ref 0.0–3.0)
Eosinophils Absolute: 0.1 10*3/uL (ref 0.0–0.7)
Eosinophils Relative: 1 % (ref 0.0–5.0)
HCT: 45.3 % (ref 39.0–52.0)
Hemoglobin: 14.7 g/dL (ref 13.0–17.0)
Lymphocytes Relative: 25.7 % (ref 12.0–46.0)
Lymphs Abs: 2.1 10*3/uL (ref 0.7–4.0)
MCHC: 32.4 g/dL (ref 30.0–36.0)
MCV: 91.5 fl (ref 78.0–100.0)
Monocytes Absolute: 0.6 10*3/uL (ref 0.1–1.0)
Monocytes Relative: 7.4 % (ref 3.0–12.0)
Neutro Abs: 5.4 10*3/uL (ref 1.4–7.7)
Neutrophils Relative %: 65.5 % (ref 43.0–77.0)
Platelets: 143 10*3/uL — ABNORMAL LOW (ref 150.0–400.0)
RBC: 4.95 Mil/uL (ref 4.22–5.81)
RDW: 13.6 % (ref 11.5–14.6)
WBC: 8.2 10*3/uL (ref 4.5–10.5)

## 2012-01-31 LAB — POCT URINALYSIS DIPSTICK
Bilirubin, UA: NEGATIVE
Blood, UA: NEGATIVE
Glucose, UA: NEGATIVE
Ketones, UA: NEGATIVE
Leukocytes, UA: NEGATIVE
Nitrite, UA: NEGATIVE
Protein, UA: NEGATIVE
Spec Grav, UA: 1.02
Urobilinogen, UA: 1
pH, UA: 8

## 2012-01-31 LAB — LIPID PANEL
Cholesterol: 124 mg/dL (ref 0–200)
HDL: 47.2 mg/dL (ref >39.00)
LDL Cholesterol: 57 mg/dL (ref 0–99)
Total CHOL/HDL Ratio: 3
Triglycerides: 97 mg/dL (ref 0.0–149.0)
VLDL: 19.4 mg/dL (ref 0.0–40.0)

## 2012-01-31 LAB — BASIC METABOLIC PANEL
BUN: 14 mg/dL (ref 6–23)
CO2: 29 mEq/L (ref 19–32)
Calcium: 9.1 mg/dL (ref 8.4–10.5)
Chloride: 104 mEq/L (ref 96–112)
Creatinine, Ser: 0.9 mg/dL (ref 0.4–1.5)
GFR: 91.02 mL/min (ref >60.00)
Glucose, Bld: 96 mg/dL (ref 70–99)
Potassium: 4.6 mEq/L (ref 3.5–5.1)
Sodium: 137 mEq/L (ref 135–145)

## 2012-01-31 LAB — HEPATIC FUNCTION PANEL
ALT: 32 U/L (ref 0–53)
AST: 21 U/L (ref 0–37)
Albumin: 3.8 g/dL (ref 3.5–5.2)
Alkaline Phosphatase: 83 U/L (ref 39–117)
Bilirubin, Direct: 0.1 mg/dL (ref 0.0–0.3)
Total Bilirubin: 0.7 mg/dL (ref 0.3–1.2)
Total Protein: 6.1 g/dL (ref 6.0–8.3)

## 2012-01-31 LAB — TSH: TSH: 1.04 u[IU]/mL (ref 0.35–5.50)

## 2012-01-31 LAB — PSA: PSA: 0 ng/mL — ABNORMAL LOW (ref 0.10–4.00)

## 2012-02-07 ENCOUNTER — Encounter: Payer: Self-pay | Admitting: Internal Medicine

## 2012-02-07 ENCOUNTER — Ambulatory Visit (INDEPENDENT_AMBULATORY_CARE_PROVIDER_SITE_OTHER): Payer: PRIVATE HEALTH INSURANCE | Admitting: Internal Medicine

## 2012-02-07 VITALS — BP 130/80 | HR 72 | Temp 97.6°F | Resp 18 | Ht 72.5 in | Wt 229.0 lb

## 2012-02-07 DIAGNOSIS — Z0000 Encounter for general adult medical examination without abnormal findings: Secondary | ICD-10-CM

## 2012-02-07 DIAGNOSIS — Z23 Encounter for immunization: Secondary | ICD-10-CM

## 2012-02-07 DIAGNOSIS — E785 Hyperlipidemia, unspecified: Secondary | ICD-10-CM

## 2012-02-07 DIAGNOSIS — Z2911 Encounter for prophylactic immunotherapy for respiratory syncytial virus (RSV): Secondary | ICD-10-CM

## 2012-02-07 DIAGNOSIS — I10 Essential (primary) hypertension: Secondary | ICD-10-CM

## 2012-02-07 MED ORDER — BENAZEPRIL-HYDROCHLOROTHIAZIDE 20-12.5 MG PO TABS: 1.0000 | ORAL_TABLET | Freq: Every day | ORAL | Status: DC

## 2012-02-07 MED ORDER — ALPRAZOLAM 0.5 MG PO TABS: 0.5000 mg | ORAL_TABLET | Freq: Every evening | ORAL | Status: DC | PRN

## 2012-02-07 MED ORDER — ATORVASTATIN CALCIUM 40 MG PO TABS: 40.0000 mg | ORAL_TABLET | Freq: Every day | ORAL | Status: DC

## 2012-02-07 NOTE — Progress Notes (Signed)
Subjective:    Patient ID: Christopher [name], male    DOB: 08/20/1950, 61 y.o.   MRN: [id number]  HPI  Wt Readings from Last 3 Encounters:  02/07/12 229 lb (103.874 kg)  02/05/11 243 lb (110.224 kg)  02/02/10 260 lb (117.935 kg)    Subjective:    Patient ID: Christopher [name], male    DOB: 1950-11-22, 61 y.o.   MRN: [id number]  HPI History of Present Illness:   59 -year-old patient seen today for a wellness exam. Medical problems include hypertension, dyslipidemia, and history of prostate cancer. He is doing quite well. He is followed closely by urology.   Colonoscopy January 2012  Preventive Screening-Counseling & Management  Alcohol-Tobacco  Smoking Status: quit   Allergies (verified):  No Known Drug Allergies   Past History:  Past Medical History:  Reviewed history from 08/23/2008 and no changes required.  GERD  Hyperlipidemia  Benign prostatic hypertrophy, history of  Hypertension  prostate cancer   Past Surgical History:   Reviewed history from 08/23/2008 and no changes required.  Tonsillectomy  sigmoidoscopy 2004  Colonoscopy 2011 prostatectomy February/2009   Family History:  Reviewed history from 08/01/2007 and no changes required.  father died age 41, prostate cancer  mother  died 73 history of chronic atrial fibrillation; CVD  Two brothers in good health; s/p benighn brain tumor   Social History:  Reviewed history from 08/01/2007 and no changes required.  Married   Past Medical History  Diagnosis Date  . NEOPLASM, MALIGNANT, PROSTATE 08/01/2007  . HYPERLIPIDEMIA 07/28/2006  . Unspecified essential hypertension 03/27/2007  . GERD 07/28/2006  . BENIGN PROSTATIC HYPERTROPHY 07/28/2006  . NISSEN FUNDOPLICATION, HX OF 07/28/2006    History   Social History  . Marital Status: Married    Spouse Name: N/A    Number of Children: N/A  . Years of Education: N/A   Occupational History  . Not on file.   Social History Main Topics  . Smoking status: Former  Games developer  . Smokeless tobacco: Never Used  . Alcohol Use: Yes     Comment: rarely  . Drug Use: Not on file  . Sexually Active: Not on file   Other Topics Concern  . Not on file   Social History Narrative  . No narrative on file    Past Surgical History  Procedure Date  . Tonsillectomy   . Prostatectomy 2009    Family History  Problem Relation Age of Onset  . Heart disease Mother   . Cancer Father     prostate    No Known Allergies  Current Outpatient Prescriptions on File Prior to Visit  Medication Sig Dispense Refill  . ALPRAZolam (XANAX) 0.5 MG tablet TAKE 1 TABLET BY MOUTH TWICE A DAY AS NEEDED FOR SLEEP OR ANXIETY  60 tablet  0  . aspirin 81 MG tablet Take 81 mg by mouth daily.        Marland Kitchen atorvastatin (LIPITOR) 40 MG tablet TAKE 1 TABLET BY MOUTH EVERY DAY  90 tablet  3  . benazepril-hydrochlorthiazide (LOTENSIN HCT) 20-12.5 MG per tablet TAKE 1 TABLET BY MOUTH EVERY DAY  90 tablet  5    BP 130/80  Pulse 72  Temp 97.6 F (36.4 C) (Oral)  Resp 18  Ht 6' 0.5" (1.842 m)  Wt 229 lb (103.874 kg)  BMI 30.63 kg/m2  SpO2 99%     Review of Systems  Constitutional: Negative for fever, chills, activity change, appetite change and fatigue.  HENT:  Negative for hearing loss, ear pain, congestion, rhinorrhea, sneezing, mouth sores, trouble swallowing, neck pain, neck stiffness, dental problem, voice change, sinus pressure and tinnitus.   Eyes: Negative for photophobia, pain, redness and visual disturbance.  Respiratory: Negative for apnea, cough, choking, chest tightness, shortness of breath and wheezing.   Cardiovascular: Negative for chest pain, palpitations and leg swelling.  Gastrointestinal: Negative for nausea, vomiting, abdominal pain, diarrhea, constipation, blood in stool, abdominal distention, anal bleeding and rectal pain.  Genitourinary: Negative for dysuria, urgency, frequency, hematuria, flank pain, decreased urine volume, discharge, penile swelling, scrotal  swelling, difficulty urinating, genital sores and testicular pain.  Musculoskeletal: Negative for myalgias, back pain, joint swelling, arthralgias and gait problem.  Skin: Negative for color change, rash and wound.  Neurological: Negative for dizziness, tremors, seizures, syncope, facial asymmetry, speech difficulty, weakness, light-headedness, numbness and headaches.  Hematological: Negative for adenopathy. Does not bruise/bleed easily.  Psychiatric/Behavioral: Negative for suicidal ideas, hallucinations, behavioral problems, confusion, sleep disturbance, self-injury, dysphoric mood, decreased concentration and agitation. The patient is not nervous/anxious.        Objective:   Physical Exam  Constitutional: He appears well-developed and well-nourished.  HENT:  Head: Normocephalic and atraumatic.  Right Ear: External ear normal.  Left Ear: External ear normal.  Nose: Nose normal.  Mouth/Throat: Oropharynx is clear and moist.  Eyes: Conjunctivae and EOM are normal. Pupils are equal, round, and reactive to light. No scleral icterus.  Neck: Normal range of motion. Neck supple. No JVD present. No thyromegaly present.  Cardiovascular: Regular rhythm, normal heart sounds and intact distal pulses.  Exam reveals no gallop and no friction rub.   No murmur heard. Pulmonary/Chest: Effort normal and breath sounds normal. He exhibits no tenderness.  Abdominal: Soft. Bowel sounds are normal. He exhibits no distension and no mass. There is no tenderness.  Genitourinary: Penis normal.  Musculoskeletal: Normal range of motion. He exhibits no edema and no tenderness.  Lymphadenopathy:    He has no cervical adenopathy.  Neurological: He is alert. He has normal reflexes. No cranial nerve deficit. Coordination normal.  Skin: Skin is warm and dry. No rash noted.  Psychiatric: He has a normal mood and affect. His behavior is normal.          Assessment & Plan:   Preventive health  examination Hypertension stable Dyslipidemia stable History prostate cancer status post prostatectomy.  We'll continue present regimen his lipid profile is under very nice control we'll continue a efforts at lifestyle. Exercise weight loss all encouraged we'll recheck in one year  Review of Systems  Constitutional: Negative for fever, chills, activity change, appetite change and fatigue.  HENT: Negative for hearing loss, ear pain, congestion, rhinorrhea, sneezing, mouth sores, trouble swallowing, neck pain, neck stiffness, dental problem, voice change, sinus pressure and tinnitus.   Eyes: Negative for photophobia, pain, redness and visual disturbance.  Respiratory: Negative for apnea, cough, choking, chest tightness, shortness of breath and wheezing.   Cardiovascular: Negative for chest pain, palpitations and leg swelling.  Gastrointestinal: Negative for nausea, vomiting, abdominal pain, diarrhea, constipation, blood in stool, abdominal distention, anal bleeding and rectal pain.  Genitourinary: Negative for dysuria, urgency, frequency, hematuria, flank pain, decreased urine volume, discharge, penile swelling, scrotal swelling, difficulty urinating, genital sores and testicular pain.  Musculoskeletal: Negative for myalgias, back pain, joint swelling, arthralgias and gait problem.  Skin: Negative for color change, rash and wound.  Neurological: Negative for dizziness, tremors, seizures, syncope, facial asymmetry, speech difficulty, weakness, light-headedness, numbness and headaches.  Hematological: Negative for adenopathy. Does not bruise/bleed easily.  Psychiatric/Behavioral: Negative for suicidal ideas, hallucinations, behavioral problems, confusion, sleep disturbance, self-injury, dysphoric mood, decreased concentration and agitation. The patient is not nervous/anxious.        Objective:   Physical Exam  Constitutional: He appears well-developed and well-nourished.  HENT:  Head:  Normocephalic and atraumatic.  Right Ear: External ear normal.  Left Ear: External ear normal.  Nose: Nose normal.  Mouth/Throat: Oropharynx is clear and moist.  Eyes: Conjunctivae normal and EOM are normal. Pupils are equal, round, and reactive to light. No scleral icterus.  Neck: Normal range of motion. Neck supple. No JVD present. No thyromegaly present.  Cardiovascular: Regular rhythm, normal heart sounds and intact distal pulses.  Exam reveals no gallop and no friction rub.   No murmur heard. Pulmonary/Chest: Effort normal and breath sounds normal. He exhibits no tenderness.  Abdominal: Soft. Bowel sounds are normal. He exhibits no distension and no mass. There is no tenderness.  Genitourinary: Penis normal.  Musculoskeletal: Normal range of motion. He exhibits no edema and no tenderness.  Lymphadenopathy:    He has no cervical adenopathy.  Neurological: He is alert. He has normal reflexes. No cranial nerve deficit. Coordination normal.  Skin: Skin is warm and dry. No rash noted.  Psychiatric: He has a normal mood and affect. His behavior is normal.          Assessment & Plan:   Preventive health exam Exogenous obesity. He continues to improve annually with better eating habits Hypertension well controlled Dyslipidemia well controlled  Continue present efforts at additional weight loss. Goal is less than 200 pounds. Recheck in one year medications refilled

## 2012-02-07 NOTE — Patient Instructions (Signed)
Limit your sodium (Salt) intake    It is important that you exercise regularly, at least 20 minutes 3 to 4 times per week.  If you develop chest pain or shortness of breath seek  medical attention.  You need to lose weight.  Consider a lower calorie diet and regular exercise.   Urology followup  Recheck 1 year or as needed  Please check your blood pressure on a regular basis.  If it is consistently greater than 150/90, please make an office appointment.

## 2012-02-09 ENCOUNTER — Other Ambulatory Visit: Payer: Self-pay | Admitting: Internal Medicine

## 2012-03-06 ENCOUNTER — Telehealth: Payer: Self-pay | Admitting: Internal Medicine

## 2012-03-06 NOTE — Telephone Encounter (Signed)
Pt states that his last refill of Xanax states "1 pill as needed". He says that up until last time, it was always "up to 2 pills as needed". He did not realize the SIG had changed, and has continued to take "2 as needed" so now he is out of pills. Last seen 12/2 for cpx. Requesting the SIG be returned back to "2 pills" and that it be sent to [facility] on [address] [facility]. Pt has been out of med since Saturday.

## 2012-03-07 MED ORDER — ALPRAZOLAM 0.5 MG PO TABS: 0.5000 mg | ORAL_TABLET | Freq: Two times a day (BID) | ORAL | Status: DC | PRN

## 2012-03-07 NOTE — Telephone Encounter (Signed)
Pt notified Rx for Xanax called into pharmacy.

## 2012-07-23 ENCOUNTER — Other Ambulatory Visit: Payer: Self-pay | Admitting: Internal Medicine

## 2012-11-17 ENCOUNTER — Other Ambulatory Visit: Payer: Self-pay | Admitting: Internal Medicine

## 2013-01-26 ENCOUNTER — Encounter: Payer: Self-pay | Admitting: Internal Medicine

## 2013-01-29 ENCOUNTER — Other Ambulatory Visit (INDEPENDENT_AMBULATORY_CARE_PROVIDER_SITE_OTHER): Payer: BC Managed Care – PPO

## 2013-01-29 DIAGNOSIS — Z0000 Encounter for general adult medical examination without abnormal findings: Secondary | ICD-10-CM

## 2013-01-29 LAB — LIPID PANEL
Cholesterol: 125 mg/dL (ref 0–200)
HDL: 53.4 mg/dL (ref >39.00)
LDL Cholesterol: 63 mg/dL (ref 0–99)
Total CHOL/HDL Ratio: 2
Triglycerides: 42 mg/dL (ref 0.0–149.0)
VLDL: 8.4 mg/dL (ref 0.0–40.0)

## 2013-01-29 LAB — POCT URINALYSIS DIPSTICK
Bilirubin, UA: NEGATIVE
Blood, UA: NEGATIVE
Glucose, UA: NEGATIVE
Ketones, UA: NEGATIVE
Leukocytes, UA: NEGATIVE
Nitrite, UA: NEGATIVE
Protein, UA: NEGATIVE
Spec Grav, UA: 1.02
Urobilinogen, UA: 0.2
pH, UA: 7

## 2013-01-29 LAB — CBC WITH DIFFERENTIAL/PLATELET
Basophils Absolute: 0 10*3/uL (ref 0.0–0.1)
Basophils Relative: 0.5 % (ref 0.0–3.0)
Eosinophils Absolute: 0.1 10*3/uL (ref 0.0–0.7)
Eosinophils Relative: 1.5 % (ref 0.0–5.0)
HCT: 43.9 % (ref 39.0–52.0)
Hemoglobin: 14.7 g/dL (ref 13.0–17.0)
Lymphocytes Relative: 27.4 % (ref 12.0–46.0)
Lymphs Abs: 1.9 10*3/uL (ref 0.7–4.0)
MCHC: 33.5 g/dL (ref 30.0–36.0)
MCV: 89.8 fl (ref 78.0–100.0)
Monocytes Absolute: 0.5 10*3/uL (ref 0.1–1.0)
Monocytes Relative: 7.3 % (ref 3.0–12.0)
Neutro Abs: 4.3 10*3/uL (ref 1.4–7.7)
Neutrophils Relative %: 63.3 % (ref 43.0–77.0)
Platelets: 160 10*3/uL (ref 150.0–400.0)
RBC: 4.89 Mil/uL (ref 4.22–5.81)
RDW: 13.7 % (ref 11.5–14.6)
WBC: 6.8 10*3/uL (ref 4.5–10.5)

## 2013-01-29 LAB — BASIC METABOLIC PANEL
BUN: 14 mg/dL (ref 6–23)
CO2: 28 mEq/L (ref 19–32)
Calcium: 9.4 mg/dL (ref 8.4–10.5)
Chloride: 103 mEq/L (ref 96–112)
Creatinine, Ser: 0.9 mg/dL (ref 0.4–1.5)
GFR: 88.45 mL/min (ref >60.00)
Glucose, Bld: 84 mg/dL (ref 70–99)
Potassium: 4.8 mEq/L (ref 3.5–5.1)
Sodium: 137 mEq/L (ref 135–145)

## 2013-01-29 LAB — HEPATIC FUNCTION PANEL
ALT: 35 U/L (ref 0–53)
AST: 24 U/L (ref 0–37)
Albumin: 4 g/dL (ref 3.5–5.2)
Alkaline Phosphatase: 83 U/L (ref 39–117)
Bilirubin, Direct: 0.1 mg/dL (ref 0.0–0.3)
Total Bilirubin: 0.7 mg/dL (ref 0.3–1.2)
Total Protein: 6.3 g/dL (ref 6.0–8.3)

## 2013-01-29 LAB — PSA: PSA: 0 ng/mL — ABNORMAL LOW (ref 0.10–4.00)

## 2013-01-29 LAB — TSH: TSH: 1.24 u[IU]/mL (ref 0.35–5.50)

## 2013-02-08 ENCOUNTER — Ambulatory Visit (INDEPENDENT_AMBULATORY_CARE_PROVIDER_SITE_OTHER): Payer: BC Managed Care – PPO | Admitting: Internal Medicine

## 2013-02-08 ENCOUNTER — Encounter: Payer: Self-pay | Admitting: Internal Medicine

## 2013-02-08 VITALS — BP 120/80 | HR 81 | Temp 97.8°F | Resp 20 | Ht 73.0 in | Wt 208.0 lb

## 2013-02-08 DIAGNOSIS — E785 Hyperlipidemia, unspecified: Secondary | ICD-10-CM

## 2013-02-08 DIAGNOSIS — N400 Enlarged prostate without lower urinary tract symptoms: Secondary | ICD-10-CM

## 2013-02-08 DIAGNOSIS — I10 Essential (primary) hypertension: Secondary | ICD-10-CM

## 2013-02-08 DIAGNOSIS — Z23 Encounter for immunization: Secondary | ICD-10-CM

## 2013-02-08 DIAGNOSIS — K219 Gastro-esophageal reflux disease without esophagitis: Secondary | ICD-10-CM

## 2013-02-08 MED ORDER — ATORVASTATIN CALCIUM 40 MG PO TABS: 40.0000 mg | ORAL_TABLET | Freq: Every day | ORAL | Status: DC

## 2013-02-08 MED ORDER — ALPRAZOLAM 0.5 MG PO TABS: ORAL_TABLET | ORAL | Status: DC

## 2013-02-08 MED ORDER — BENAZEPRIL-HYDROCHLOROTHIAZIDE 20-12.5 MG PO TABS: 1.0000 | ORAL_TABLET | Freq: Every day | ORAL | Status: DC

## 2013-02-08 NOTE — Progress Notes (Signed)
Pre-visit discussion using our clinic review tool. No additional management support is needed unless otherwise documented below in the visit note.  

## 2013-02-08 NOTE — Patient Instructions (Signed)
Limit your sodium (Salt) intake    It is important that you exercise regularly, at least 20 minutes 3 to 4 times per week.  If you develop chest pain or shortness of breath seek  medical attention.  Return in one year for follow-up   Please check your blood pressure on a regular basis.  If it is consistently greater than 150/90, please make an office appointment.   

## 2013-02-08 NOTE — Progress Notes (Signed)
Subjective:    Patient ID: Christopher [name], male    DOB: 04/07/1950, 62 y.o.   MRN: [id number]  HPI   Wt Readings from Last 3 Encounters:  02/08/13 208 lb (94.348 kg)  02/07/12 229 lb (103.874 kg)  02/05/11 243 lb (110.224 kg)    Subjective:    Patient ID: Christopher [name], male    DOB: 1950/04/02, 61 y.o.   MRN: [id number]  Pre-visit discussion using our clinic review tool. No additional management support is needed unless otherwise documented below in the visit note.  Wt Readings from Last 3 Encounters:  02/08/13 208 lb (94.348 kg)  02/07/12 229 lb (103.874 kg)  02/05/11 243 lb (110.224 kg)    HPI History of Present Illness:   26 -year-old patient seen today for a wellness exam. Medical problems include hypertension, dyslipidemia, and history of prostate cancer. He is doing quite well. He is followed closely by urology.   Colonoscopy January 2012  Preventive Screening-Counseling & Management  Alcohol-Tobacco  Smoking Status: quit   Allergies (verified):  No Known Drug Allergies   Past History:   GERD  Hyperlipidemia  Benign prostatic hypertrophy, history of  Hypertension  prostate cancer   Past Surgical History:   Tonsillectomy  sigmoidoscopy 2004  Colonoscopy 2011 prostatectomy February/2009   Family History:   father died age 13, prostate cancer  mother  died 76 history of chronic atrial fibrillation; CVD  Two brothers in good health; s/p benighn brain tumor   Social History:   Married   Past Medical History  Diagnosis Date  . NEOPLASM, MALIGNANT, PROSTATE 08/01/2007  . HYPERLIPIDEMIA 07/28/2006  . Unspecified essential hypertension 03/27/2007  . GERD 07/28/2006  . BENIGN PROSTATIC HYPERTROPHY 07/28/2006  . NISSEN FUNDOPLICATION, HX OF 07/28/2006    History   Social History  . Marital Status: Married    Spouse Name: N/A    Number of Children: N/A  . Years of Education: N/A   Occupational History  . Not on file.   Social History Main Topics   . Smoking status: Former Games developer  . Smokeless tobacco: Never Used  . Alcohol Use: No     Comment: rarely  . Drug Use: No  . Sexual Activity: Not on file   Other Topics Concern  . Not on file   Social History Narrative  . No narrative on file    Past Surgical History  Procedure Laterality Date  . Tonsillectomy    . Prostatectomy  2009    Family History  Problem Relation Age of Onset  . Heart disease Mother   . Cancer Father     prostate    No Known Allergies  Current Outpatient Prescriptions on File Prior to Visit  Medication Sig Dispense Refill  . aspirin 81 MG tablet Take 81 mg by mouth daily.         No current facility-administered medications on file prior to visit.    BP 120/80  Pulse 81  Temp(Src) 97.8 F (36.6 C) (Oral)  Resp 20  Ht 6\' 1"  (1.854 m)  Wt 208 lb (94.348 kg)  BMI 27.45 kg/m2  SpO2 99%     Review of Systems  Constitutional: Negative for fever, chills, activity change, appetite change and fatigue.  HENT: Negative for hearing loss, ear pain, congestion, rhinorrhea, sneezing, mouth sores, trouble swallowing, neck pain, neck stiffness, dental problem, voice change, sinus pressure and tinnitus.   Eyes: Negative for photophobia, pain, redness and visual disturbance.  Respiratory: Negative for apnea,  cough, choking, chest tightness, shortness of breath and wheezing.   Cardiovascular: Negative for chest pain, palpitations and leg swelling.  Gastrointestinal: Negative for nausea, vomiting, abdominal pain, diarrhea, constipation, blood in stool, abdominal distention, anal bleeding and rectal pain.  Genitourinary: Negative for dysuria, urgency, frequency, hematuria, flank pain, decreased urine volume, discharge, penile swelling, scrotal swelling, difficulty urinating, genital sores and testicular pain.  Musculoskeletal: Negative for myalgias, back pain, joint swelling, arthralgias and gait problem.  Skin: Negative for color change, rash and wound.   Neurological: Negative for dizziness, tremors, seizures, syncope, facial asymmetry, speech difficulty, weakness, light-headedness, numbness and headaches.  Hematological: Negative for adenopathy. Does not bruise/bleed easily.  Psychiatric/Behavioral: Negative for suicidal ideas, hallucinations, behavioral problems, confusion, sleep disturbance, self-injury, dysphoric mood, decreased concentration and agitation. The patient is not nervous/anxious.        Objective:   Physical Exam  Constitutional: He appears well-developed and well-nourished.  HENT:  Head: Normocephalic and atraumatic.  Right Ear: External ear normal.  Left Ear: External ear normal.  Nose: Nose normal.  Mouth/Throat: Oropharynx is clear and moist.  Eyes: Conjunctivae and EOM are normal. Pupils are equal, round, and reactive to light. No scleral icterus.  Neck: Normal range of motion. Neck supple. No JVD present. No thyromegaly present.  Cardiovascular: Regular rhythm, normal heart sounds and intact distal pulses.  Exam reveals no gallop and no friction rub.   No murmur heard. Pulmonary/Chest: Effort normal and breath sounds normal. He exhibits no tenderness.  Abdominal: Soft. Bowel sounds are normal. He exhibits no distension and no mass. There is no tenderness.  Genitourinary: Penis normal.  Musculoskeletal: Normal range of motion. He exhibits no edema and no tenderness.  Lymphadenopathy:    He has no cervical adenopathy.  Neurological: He is alert. He has normal reflexes. No cranial nerve deficit. Coordination normal.  Skin: Skin is warm and dry. No rash noted.  Psychiatric: He has a normal mood and affect. His behavior is normal.          Assessment & Plan:   Preventive health examination Hypertension stable Dyslipidemia stable History prostate cancer status post prostatectomy.  We'll continue present regimen his lipid profile is under very nice control we'll continue  efforts at lifestyle. Exercise  weight loss all encouraged we'll recheck in one year  Review of Systems  Constitutional: Negative for fever, chills, activity change, appetite change and fatigue.  HENT: Negative for congestion, dental problem, ear pain, hearing loss, mouth sores, rhinorrhea, sinus pressure, sneezing, tinnitus, trouble swallowing and voice change.   Eyes: Negative for photophobia, pain, redness and visual disturbance.  Respiratory: Negative for apnea, cough, choking, chest tightness, shortness of breath and wheezing.   Cardiovascular: Negative for chest pain, palpitations and leg swelling.  Gastrointestinal: Negative for nausea, vomiting, abdominal pain, diarrhea, constipation, blood in stool, abdominal distention, anal bleeding and rectal pain.  Genitourinary: Negative for dysuria, urgency, frequency, hematuria, flank pain, decreased urine volume, discharge, penile swelling, scrotal swelling, difficulty urinating, genital sores and testicular pain.  Musculoskeletal: Negative for arthralgias, back pain, gait problem, joint swelling, myalgias, neck pain and neck stiffness.  Skin: Negative for color change, rash and wound.  Neurological: Negative for dizziness, tremors, seizures, syncope, facial asymmetry, speech difficulty, weakness, light-headedness, numbness and headaches.  Hematological: Negative for adenopathy. Does not bruise/bleed easily.  Psychiatric/Behavioral: Negative for suicidal ideas, hallucinations, behavioral problems, confusion, sleep disturbance, self-injury, dysphoric mood, decreased concentration and agitation. The patient is not nervous/anxious.        Objective:  Physical Exam  Constitutional: He appears well-developed and well-nourished.  HENT:  Head: Normocephalic and atraumatic.  Right Ear: External ear normal.  Left Ear: External ear normal.  Nose: Nose normal.  Mouth/Throat: Oropharynx is clear and moist.  Eyes: Conjunctivae and EOM are normal. Pupils are equal, round, and  reactive to light. No scleral icterus.  Neck: Normal range of motion. Neck supple. No JVD present. No thyromegaly present.  Cardiovascular: Regular rhythm, normal heart sounds and intact distal pulses.  Exam reveals no gallop and no friction rub.   No murmur heard. Pulmonary/Chest: Effort normal and breath sounds normal. He exhibits no tenderness.  Abdominal: Soft. Bowel sounds are normal. He exhibits no distension and no mass. There is no tenderness.  Genitourinary: Penis normal.  Musculoskeletal: Normal range of motion. He exhibits no edema and no tenderness.  Lymphadenopathy:    He has no cervical adenopathy.  Neurological: He is alert. He has normal reflexes. No cranial nerve deficit. Coordination normal.  Skin: Skin is warm and dry. No rash noted.  Psychiatric: He has a normal mood and affect. His behavior is normal.          Assessment & Plan:   Preventive health exam Exogenous obesity. He continues to improve annually with better eating habits Hypertension well controlled Dyslipidemia well controlled  Continue present efforts at additional weight loss. Goal is less than 200 pounds. Recheck in one year medications refilled

## 2013-02-09 ENCOUNTER — Other Ambulatory Visit: Payer: Self-pay | Admitting: Internal Medicine

## 2013-02-11 ENCOUNTER — Other Ambulatory Visit: Payer: Self-pay | Admitting: Internal Medicine

## 2013-02-16 ENCOUNTER — Encounter: Payer: Self-pay | Admitting: Internal Medicine

## 2013-05-09 ENCOUNTER — Emergency Department (HOSPITAL_BASED_OUTPATIENT_CLINIC_OR_DEPARTMENT_OTHER)
Admission: EM | Admit: 2013-05-09 | Discharge: 2013-05-09 | Disposition: A | Discharge status: Home / Self Care | Payer: BC Managed Care – PPO | Attending: Emergency Medicine | Admitting: Emergency Medicine

## 2013-05-09 ENCOUNTER — Emergency Department (HOSPITAL_BASED_OUTPATIENT_CLINIC_OR_DEPARTMENT_OTHER): Payer: BC Managed Care – PPO

## 2013-05-09 ENCOUNTER — Encounter (HOSPITAL_BASED_OUTPATIENT_CLINIC_OR_DEPARTMENT_OTHER): Payer: Self-pay | Admitting: Emergency Medicine

## 2013-05-09 DIAGNOSIS — E785 Hyperlipidemia, unspecified: Secondary | ICD-10-CM | POA: Insufficient documentation

## 2013-05-09 DIAGNOSIS — Z8546 Personal history of malignant neoplasm of prostate: Secondary | ICD-10-CM | POA: Insufficient documentation

## 2013-05-09 DIAGNOSIS — Z79899 Other long term (current) drug therapy: Secondary | ICD-10-CM | POA: Insufficient documentation

## 2013-05-09 DIAGNOSIS — Z8673 Personal history of transient ischemic attack (TIA), and cerebral infarction without residual deficits: Secondary | ICD-10-CM | POA: Insufficient documentation

## 2013-05-09 DIAGNOSIS — I10 Essential (primary) hypertension: Secondary | ICD-10-CM | POA: Insufficient documentation

## 2013-05-09 DIAGNOSIS — Z7982 Long term (current) use of aspirin: Secondary | ICD-10-CM | POA: Insufficient documentation

## 2013-05-09 DIAGNOSIS — Z87891 Personal history of nicotine dependence: Secondary | ICD-10-CM | POA: Insufficient documentation

## 2013-05-09 DIAGNOSIS — Z9889 Other specified postprocedural states: Secondary | ICD-10-CM | POA: Insufficient documentation

## 2013-05-09 DIAGNOSIS — Z87448 Personal history of other diseases of urinary system: Secondary | ICD-10-CM | POA: Insufficient documentation

## 2013-05-09 DIAGNOSIS — K219 Gastro-esophageal reflux disease without esophagitis: Secondary | ICD-10-CM | POA: Insufficient documentation

## 2013-05-09 LAB — CBC WITH DIFFERENTIAL/PLATELET
Basophils Absolute: 0 10*3/uL (ref 0.0–0.1)
Basophils Relative: 0 % (ref 0–1)
Eosinophils Absolute: 0 10*3/uL (ref 0.0–0.7)
Eosinophils Relative: 0 % (ref 0–5)
HCT: 42.4 % (ref 39.0–52.0)
Hemoglobin: 14.5 g/dL (ref 13.0–17.0)
Lymphocytes Relative: 7 % — ABNORMAL LOW (ref 12–46)
Lymphs Abs: 1.2 10*3/uL (ref 0.7–4.0)
MCH: 31 pg (ref 26.0–34.0)
MCHC: 34.2 g/dL (ref 30.0–36.0)
MCV: 90.8 fL (ref 78.0–100.0)
Monocytes Absolute: 1.4 10*3/uL — ABNORMAL HIGH (ref 0.1–1.0)
Monocytes Relative: 8 % (ref 3–12)
Neutro Abs: 15.5 10*3/uL — ABNORMAL HIGH (ref 1.7–7.7)
Neutrophils Relative %: 85 % — ABNORMAL HIGH (ref 43–77)
Platelets: 151 10*3/uL (ref 150–400)
RBC: 4.67 MIL/uL (ref 4.22–5.81)
RDW: 12.8 % (ref 11.5–15.5)
WBC: 18.1 10*3/uL — ABNORMAL HIGH (ref 4.0–10.5)

## 2013-05-09 LAB — BASIC METABOLIC PANEL
BUN: 16 mg/dL (ref 6–23)
CO2: 24 mEq/L (ref 19–32)
Calcium: 9.5 mg/dL (ref 8.4–10.5)
Chloride: 100 mEq/L (ref 96–112)
Creatinine, Ser: 0.9 mg/dL (ref 0.50–1.35)
GFR calc Af Amer: >90 mL/min (ref >90)
GFR calc non Af Amer: 89 mL/min — ABNORMAL LOW (ref >90)
Glucose, Bld: 111 mg/dL — ABNORMAL HIGH (ref 70–99)
Potassium: 4.2 mEq/L (ref 3.7–5.3)
Sodium: 139 mEq/L (ref 137–147)

## 2013-05-09 LAB — TROPONIN I
Troponin I: <0.3 ng/mL (ref <0.30)
Troponin I: <0.3 ng/mL (ref <0.30)

## 2013-05-09 MED ORDER — GI COCKTAIL ~~LOC~~
30.0000 mL | Freq: Once | ORAL | Status: AC
  Administered 2013-05-09: 30 mL via ORAL
  Filled 2013-05-09: qty 30

## 2013-05-09 MED ORDER — OMEPRAZOLE 20 MG PO CPDR: 20.0000 mg | DELAYED_RELEASE_CAPSULE | Freq: Two times a day (BID) | ORAL | Status: DC

## 2013-05-09 MED ORDER — ONDANSETRON 4 MG PO TBDP: 4.0000 mg | ORAL_TABLET | Freq: Three times a day (TID) | ORAL | Status: DC | PRN

## 2013-05-09 MED ORDER — MORPHINE SULFATE 4 MG/ML IJ SOLN: 4.0000 mg | INTRAMUSCULAR | Status: DC | PRN

## 2013-05-09 MED ORDER — ONDANSETRON HCL 4 MG/2ML IJ SOLN
4.0000 mg | Freq: Once | INTRAMUSCULAR | Status: AC
  Administered 2013-05-09: 4 mg via INTRAVENOUS
  Filled 2013-05-09: qty 2

## 2013-05-09 MED ORDER — HYDROCODONE-ACETAMINOPHEN 5-325 MG PO TABS: 2.0000 | ORAL_TABLET | ORAL | Status: DC | PRN

## 2013-05-09 MED ORDER — PANTOPRAZOLE SODIUM 40 MG IV SOLR
40.0000 mg | Freq: Once | INTRAVENOUS | Status: AC
  Administered 2013-05-09: 40 mg via INTRAVENOUS
  Filled 2013-05-09: qty 40

## 2013-05-09 NOTE — ED Notes (Signed)
Epigastric pain, pressure onset around 1400 this pm denies n/v

## 2013-05-09 NOTE — Discharge Instructions (Signed)
Diet for Gastroesophageal Reflux Disease, Adult Reflux is when stomach acid flows up into the esophagus. The esophagus becomes irritated and sore (inflammation). When reflux happens often and is severe, it is called gastroesophageal reflux disease (GERD). What you eat can help ease any discomfort caused by GERD. FOODS OR DRINKS TO AVOID OR LIMIT  Coffee and black tea, with or without caffeine.  Bubbly (carbonated) drinks with caffeine or energy drinks.  Strong spices, such as pepper, cayenne pepper, curry, or chili powder.  Peppermint or spearmint.  Chocolate.  High-fat foods, such as meats, fried food, oils, butter, or nuts.  Fruits and vegetables that cause discomfort. This includes citrus fruits and tomatoes.  Alcohol. If a certain food or drink irritates your GERD, avoid eating or drinking it. THINGS THAT MAY HELP GERD INCLUDE:  Eat meals slowly.  Eat 5 to 6 small meals a day, not 3 large meals.  Do not eat food for a certain amount of time if it causes discomfort.  Wait 3 hours after eating before lying down.  Keep the head of your bed raised 6 to 9 inches (15 23 centimeters). Put a foam wedge or blocks under the legs of the bed.  Stay active. Weight loss, if needed, may help ease your discomfort.  Wear loose-fitting clothing.  Do not smoke or chew tobacco. Document Released: 08/24/2011 Document Reviewed: 08/24/2011 [facility] Patient Information 2014 [place].  Gastroesophageal Reflux Disease, Adult Gastroesophageal reflux disease (GERD) happens when acid from your stomach flows up into the esophagus. When acid comes in contact with the esophagus, the acid causes soreness (inflammation) in the esophagus. Over time, GERD may create small holes (ulcers) in the lining of the esophagus. CAUSES   Increased body weight. This puts pressure on the stomach, making acid rise from the stomach into the esophagus.  Smoking. This increases acid production in the  stomach.  Drinking alcohol. This causes decreased pressure in the lower esophageal sphincter (valve or ring of muscle between the esophagus and stomach), allowing acid from the stomach into the esophagus.  Late evening meals and a full stomach. This increases pressure and acid production in the stomach.  A malformed lower esophageal sphincter. Sometimes, no cause is found. SYMPTOMS   Burning pain in the lower part of the mid-chest behind the breastbone and in the mid-stomach area. This may occur twice a week or more often.  Trouble swallowing.  Sore throat.  Dry cough.  Asthma-like symptoms including chest tightness, shortness of breath, or wheezing. DIAGNOSIS  Your caregiver may be able to diagnose GERD based on your symptoms. In some cases, X-rays and other tests may be done to check for complications or to check the condition of your stomach and esophagus. TREATMENT  Your caregiver may recommend over-the-counter or prescription medicines to help decrease acid production. Ask your caregiver before starting or adding any new medicines.  HOME CARE INSTRUCTIONS   Change the factors that you can control. Ask your caregiver for guidance concerning weight loss, quitting smoking, and alcohol consumption.  Avoid foods and drinks that make your symptoms worse, such as:  Caffeine or alcoholic drinks.  Chocolate.  Peppermint or mint flavorings.  Garlic and onions.  Spicy foods.  Citrus fruits, such as oranges, lemons, or limes.  Tomato-based foods such as sauce, chili, salsa, and pizza.  Fried and fatty foods.  Avoid lying down for the 3 hours prior to your bedtime or prior to taking a nap.  Eat small, frequent meals instead of large meals.  Wear loose-fitting clothing. Do not wear anything tight around your waist that causes pressure on your stomach.  Raise the head of your bed 6 to 8 inches with wood blocks to help you sleep. Extra pillows will not help.  Only take  over-the-counter or prescription medicines for pain, discomfort, or fever as directed by your caregiver.  Do not take aspirin, ibuprofen, or other nonsteroidal anti-inflammatory drugs (NSAIDs). SEEK IMMEDIATE MEDICAL CARE IF:   You have pain in your arms, neck, jaw, teeth, or back.  Your pain increases or changes in intensity or duration.  You develop nausea, vomiting, or sweating (diaphoresis).  You develop shortness of breath, or you faint.  Your vomit is green, yellow, black, or looks like coffee grounds or blood.  Your stool is red, bloody, or black. These symptoms could be signs of other problems, such as heart disease, gastric bleeding, or esophageal bleeding. MAKE SURE YOU:   Understand these instructions.  Will watch your condition.  Will get help right away if you are not doing well or get worse. Document Released: 12/02/2004 Document Revised: 05/17/2011 Document Reviewed: 09/11/2010 [facility] Patient Information 2014 [place], [place].

## 2013-05-09 NOTE — ED Notes (Signed)
Pt reports indigestion, chest burning x 5 hours.  Reports that it is the worst indigestion he's had.  Pt taken tums without relief.  Denies SOB, N/v, denies diaphoresis.

## 2013-05-09 NOTE — ED Notes (Signed)
Pt states abd pressure is less

## 2013-05-09 NOTE — ED Provider Notes (Signed)
CSN: [id number]     Arrival date & time 05/09/13  1758 History  This chart was scribed for Christopher Furry, MD by Einar Pheasant, ED Scribe. This patient was seen in room MH06/MH06 and the patient's care was started at 7:55 PM.    Chief Complaint  Patient presents with  . Chest Pain   The history is provided by the patient and the spouse. No language interpreter was used.   HPI Comments: Christopher [name] is a 63 y.o. male who presents to the Emergency Department complaining of worsening constant chest pain that started about 8 hours ago. Pt states that he started experiencing the pain after lunch. He describes the pain as "intense pressure". He currently rates his pain as a 2/10. Pt states that the pain started in his chest. He says that the pain does not radiate. He thought that is may have been due to indigestion or acid reflux, but usually after drinking some ginger ale and sitting up straight it resolves. However, this episode has not gone away after his usual remedy for indigestion or GERD. Pt states that he works out regularly at [employer]. 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 Pt states that he had not abnormalities on his CT scan that was performed by his neurologist. Denies any numbness.  He denies smoking. Pt denies any family or personal history of MI.   In 1751 pt had a Fundoplication procedure  Past Medical History  Diagnosis Date  . NEOPLASM, MALIGNANT, PROSTATE 08/01/2007  . HYPERLIPIDEMIA 07/28/2006  . Unspecified essential hypertension 03/27/2007  . GERD 07/28/2006  . BENIGN PROSTATIC HYPERTROPHY 07/28/2006  . NISSEN FUNDOPLICATION, HX OF 0/25/8527   Past Surgical History  Procedure Laterality Date  . Tonsillectomy    . Prostatectomy  2009   Family History  Problem Relation Age of Onset  . Heart disease Mother   . Cancer Father     prostate   History  Substance Use Topics  . Smoking status: Former Research scientist (life sciences)  . Smokeless tobacco: Never Used  . Alcohol Use:  No     Comment: rarely    Review of Systems  Constitutional: Negative for chills and appetite change.  HENT: Negative for mouth sores and sore throat.   Eyes: Negative for visual disturbance.  Respiratory: Negative for chest tightness and wheezing.   Cardiovascular: Positive for chest pain.  Gastrointestinal: Negative for diarrhea and abdominal distention.  Endocrine: Negative for polydipsia, polyphagia and polyuria.  Genitourinary: Negative for dysuria, frequency and hematuria.  Musculoskeletal: Negative for gait problem.  Skin: Negative for color change, pallor and rash.  Neurological: Negative for syncope and light-headedness.  Hematological: Does not bruise/bleed easily.  Psychiatric/Behavioral: Negative for behavioral problems and confusion.      Allergies  Review of patient's allergies indicates no known allergies.  Home Medications   Current Outpatient Rx  Name  Route  Sig  Dispense  Refill  . ALPRAZolam (XANAX) 0.5 MG tablet      TAKE 1 TABLET BY MOUTH TWICE A DAY AS NEEDED   60 tablet   5   . aspirin 81 MG tablet   Oral   Take 81 mg by mouth daily.           Marland Kitchen atorvastatin (LIPITOR) 40 MG tablet   Oral   Take 1 tablet (40 mg total) by mouth daily.   90 tablet   3   . benazepril-hydrochlorthiazide (LOTENSIN HCT) 20-12.5 MG per tablet  Oral   Take 1 tablet by mouth daily.   90 tablet   3   . benazepril-hydrochlorthiazide (LOTENSIN HCT) 20-12.5 MG per tablet      TAKE 1 TABLET BY MOUTH DAILY.   90 tablet   1   . HYDROcodone-acetaminophen (NORCO/VICODIN) 5-325 MG per tablet   Oral   Take 2 tablets by mouth every 4 (four) hours as needed.   10 tablet   0   . omeprazole (PRILOSEC) 20 MG capsule   Oral   Take 1 capsule (20 mg total) by mouth 2 (two) times daily.   60 capsule   0   . ondansetron (ZOFRAN ODT) 4 MG disintegrating tablet   Oral   Take 1 tablet (4 mg total) by mouth every 8 (eight) hours as needed for nausea.   20 tablet   0     Triage Vitals: BP 144/90  Pulse 78  Temp(Src) 98 F (36.7 C) (Oral)  Resp 18  Ht 6' (1.829 m)  Wt 210 lb (95.255 kg)  BMI 28.47 kg/m2  SpO2 100%  Physical Exam  Nursing note and vitals reviewed. Constitutional: He is oriented to person, place, and time. He appears well-developed and well-nourished. No distress.  HENT:  Head: Normocephalic and atraumatic.  Right Ear: External ear normal.  Left Ear: External ear normal.  Nose: Nose normal.  Mouth/Throat: Oropharynx is clear and moist.  Eyes: Conjunctivae and EOM are normal. Pupils are equal, round, and reactive to light. No scleral icterus.  Neck: Normal range of motion. Neck supple. No JVD present. No thyromegaly present.  Cardiovascular: Normal rate, regular rhythm and normal heart sounds.  Exam reveals no gallop and no friction rub.   No murmur heard. Pulmonary/Chest: Effort normal and breath sounds normal. No respiratory distress. He has no wheezes. He has no rales.  Abdominal: Soft. Bowel sounds are normal. He exhibits no distension. There is no tenderness. There is no rebound.  Musculoskeletal: Normal range of motion.  Neurological: He is alert and oriented to person, place, and time.  Skin: Skin is warm and dry. No rash noted.  Psychiatric: He has a normal mood and affect. His behavior is normal.    ED Course  Procedures (including critical care time)  DIAGNOSTIC STUDIES: Oxygen Saturation is 95% on room air, Normal by my interpretation.    COORDINATION OF CARE: 8:05 PM-will order full CBC. Pt advised of plan for treatment and pt agrees.  Labs Review Labs Reviewed  CBC WITH DIFFERENTIAL - Abnormal; Notable for the following:    WBC 18.1 (*)    Neutrophils Relative % 85 (*)    Neutro Abs 15.5 (*)    Lymphocytes Relative 7 (*)    Monocytes Absolute 1.4 (*)    All other components within normal limits  BASIC METABOLIC PANEL - Abnormal; Notable for the following:    Glucose, Bld 111 (*)    GFR calc non Af Amer  89 (*)    All other components within normal limits  TROPONIN I  TROPONIN I   Imaging Review Dg Ribs Unilateral W/chest Left  05/09/2013   CLINICAL DATA:  Mid chest pain and abdominal pressure. No shortness of breath  EXAM: LEFT RIBS AND CHEST - 3+ VIEW  COMPARISON:  None.  FINDINGS: No fracture or other bone lesions are seen involving the ribs. There is no evidence of pneumothorax or pleural effusion. Both lungs are clear. Heart size and mediastinal contours are within normal limits.  IMPRESSION: Negative.  Electronically Signed   By: Kathreen Devoid   On: 05/09/2013 20:21     EKG Interpretation   Date/Time:  Wednesday May 09 2013 18:04:59 EST Ventricular Rate:  81 PR Interval:  150 QRS Duration: 92 QT Interval:  392 QTC Calculation: 455 R Axis:   24 Text Interpretation:  Normal sinus rhythm Possible Left atrial enlargement  Borderline ECG Confirmed by Jeneen Rinks  MD, Stanley ([id number]) on 05/09/2013 8:03:57 PM      MDM   Final diagnoses:  GERD (gastroesophageal reflux disease)    His EKG shows no acute abnormalities. His troponin is normal his repeat 2 hours later is normal. He is now 10 hours status post his onset of symptoms with normal troponins x2. Symptoms not quite like that of an episode of reflux esophagitis. I think is perfectly safe and appropriate for outpatient treatment with proton pump inhibitor, bland reflux diet, and primary care followup.  I personally performed the services described in this documentation, which was scribed in my presence. The recorded information has been reviewed and is accurate.    Christopher Furry, MD 05/09/13 2308

## 2013-06-08 ENCOUNTER — Other Ambulatory Visit: Payer: Self-pay | Admitting: Internal Medicine

## 2013-08-14 ENCOUNTER — Other Ambulatory Visit: Payer: Self-pay | Admitting: Internal Medicine

## 2013-12-14 ENCOUNTER — Encounter: Payer: Self-pay | Admitting: Internal Medicine

## 2013-12-14 ENCOUNTER — Other Ambulatory Visit: Payer: Self-pay | Admitting: Internal Medicine

## 2014-01-12 ENCOUNTER — Other Ambulatory Visit: Payer: Self-pay | Admitting: Internal Medicine

## 2014-02-02 ENCOUNTER — Other Ambulatory Visit: Payer: Self-pay | Admitting: Internal Medicine

## 2014-02-04 ENCOUNTER — Other Ambulatory Visit: Payer: BC Managed Care – PPO

## 2014-02-11 ENCOUNTER — Encounter: Payer: BC Managed Care – PPO | Admitting: Internal Medicine

## 2014-02-18 ENCOUNTER — Other Ambulatory Visit: Payer: Self-pay | Admitting: Internal Medicine

## 2014-02-18 ENCOUNTER — Other Ambulatory Visit (INDEPENDENT_AMBULATORY_CARE_PROVIDER_SITE_OTHER): Payer: BC Managed Care – PPO

## 2014-02-18 DIAGNOSIS — Z0000 Encounter for general adult medical examination without abnormal findings: Secondary | ICD-10-CM

## 2014-02-18 LAB — CBC WITH DIFFERENTIAL/PLATELET
Basophils Absolute: 0 10*3/uL (ref 0.0–0.1)
Basophils Relative: 0.4 % (ref 0.0–3.0)
Eosinophils Absolute: 0.1 10*3/uL (ref 0.0–0.7)
Eosinophils Relative: 1 % (ref 0.0–5.0)
HCT: 43.8 % (ref 39.0–52.0)
Hemoglobin: 14.5 g/dL (ref 13.0–17.0)
Lymphocytes Relative: 24 % (ref 12.0–46.0)
Lymphs Abs: 1.9 10*3/uL (ref 0.7–4.0)
MCHC: 33 g/dL (ref 30.0–36.0)
MCV: 90.5 fl (ref 78.0–100.0)
Monocytes Absolute: 0.5 10*3/uL (ref 0.1–1.0)
Monocytes Relative: 6.9 % (ref 3.0–12.0)
Neutro Abs: 5.3 10*3/uL (ref 1.4–7.7)
Neutrophils Relative %: 67.7 % (ref 43.0–77.0)
Platelets: 154 10*3/uL (ref 150.0–400.0)
RBC: 4.84 Mil/uL (ref 4.22–5.81)
RDW: 13.4 % (ref 11.5–15.5)
WBC: 7.9 10*3/uL (ref 4.0–10.5)

## 2014-02-18 LAB — POCT URINALYSIS DIPSTICK
Bilirubin, UA: NEGATIVE
Blood, UA: NEGATIVE
Glucose, UA: NEGATIVE
Ketones, UA: NEGATIVE
Leukocytes, UA: NEGATIVE
Nitrite, UA: NEGATIVE
Protein, UA: NEGATIVE
Spec Grav, UA: 1.02
Urobilinogen, UA: 0.2
pH, UA: 7

## 2014-02-18 LAB — COMPREHENSIVE METABOLIC PANEL
ALT: 25 U/L (ref 0–53)
AST: 21 U/L (ref 0–37)
Albumin: 4 g/dL (ref 3.5–5.2)
Alkaline Phosphatase: 83 U/L (ref 39–117)
BUN: 18 mg/dL (ref 6–23)
CO2: 27 mEq/L (ref 19–32)
Calcium: 9.1 mg/dL (ref 8.4–10.5)
Chloride: 104 mEq/L (ref 96–112)
Creatinine, Ser: 0.9 mg/dL (ref 0.4–1.5)
GFR: 92.79 mL/min (ref >60.00)
Glucose, Bld: 92 mg/dL (ref 70–99)
Potassium: 4.2 mEq/L (ref 3.5–5.1)
Sodium: 136 mEq/L (ref 135–145)
Total Bilirubin: 0.7 mg/dL (ref 0.2–1.2)
Total Protein: 6.3 g/dL (ref 6.0–8.3)

## 2014-02-18 LAB — LIPID PANEL
Cholesterol: 138 mg/dL (ref 0–200)
HDL: 49.8 mg/dL (ref >39.00)
LDL Cholesterol: 72 mg/dL (ref 0–99)
NonHDL: 88.2
Total CHOL/HDL Ratio: 3
Triglycerides: 81 mg/dL (ref 0.0–149.0)
VLDL: 16.2 mg/dL (ref 0.0–40.0)

## 2014-02-18 LAB — PSA: PSA: 0 ng/mL — ABNORMAL LOW (ref 0.10–4.00)

## 2014-02-18 LAB — TSH: TSH: 1.07 u[IU]/mL (ref 0.35–4.50)

## 2014-02-25 ENCOUNTER — Encounter: Payer: Self-pay | Admitting: Internal Medicine

## 2014-02-25 ENCOUNTER — Ambulatory Visit (INDEPENDENT_AMBULATORY_CARE_PROVIDER_SITE_OTHER): Payer: BC Managed Care – PPO | Admitting: Internal Medicine

## 2014-02-25 VITALS — BP 130/90 | HR 70 | Temp 98.1°F | Resp 20 | Ht 73.0 in | Wt 216.0 lb

## 2014-02-25 DIAGNOSIS — Z0000 Encounter for general adult medical examination without abnormal findings: Secondary | ICD-10-CM

## 2014-02-25 NOTE — Progress Notes (Signed)
Pre visit review using our clinic review tool, if applicable. No additional management support is needed unless otherwise documented below in the visit note. 

## 2014-02-25 NOTE — Patient Instructions (Signed)

## 2014-02-25 NOTE — Progress Notes (Signed)
Subjective:    Patient ID: Christopher [name], male    DOB: March 01, 1951, 63 y.o.   MRN: [id number]  HPI  Wt Readings from Last 3 Encounters:  02/25/14 216 lb (97.977 kg)  05/09/13 210 lb (95.255 kg)  02/08/13 208 lb (94.348 kg)    Subjective:    Patient ID: Christopher [name], male    DOB: Feb 06, 1951, 63 y.o.   MRN: [id number]  HPI   Wt Readings from Last 3 Encounters:  02/25/14 216 lb (97.977 kg)  05/09/13 210 lb (95.255 kg)  02/08/13 208 lb (94.348 kg)    Subjective:    Patient ID: Christopher [name], male    DOB: 03-08-1951, 63 y.o.   MRN: [id number]  Pre-visit discussion using our clinic review tool. No additional management support is needed unless otherwise documented below in the visit note.  Wt Readings from Last 3 Encounters:  02/25/14 216 lb (97.977 kg)  05/09/13 210 lb (95.255 kg)  02/08/13 208 lb (94.348 kg)    HPI History of Present Illness:   63  -year-old patient seen today for a wellness exam. Medical problems include hypertension, dyslipidemia, and history of prostate cancer. He is doing quite well. He has been followed by urology in the past but has been discharged  Colonoscopy January 2012  Preventive Screening-Counseling & Management  Alcohol-Tobacco  Smoking Status: quit   Allergies (verified):  No Known Drug Allergies   Past History:   GERD  Hyperlipidemia  Benign prostatic hypertrophy, history of  Hypertension  prostate cancer   Past Surgical History:   Tonsillectomy  sigmoidoscopy 2004  Colonoscopy 2011 prostatectomy February/2009   Family History:   father died age 85, prostate cancer  mother  died 25 history of chronic atrial fibrillation; CVD  Two brothers in good health; s/p benighn brain tumor   Social History:   Married   Past Medical History  Diagnosis Date  . NEOPLASM, MALIGNANT, PROSTATE 08/01/2007  . HYPERLIPIDEMIA 07/28/2006  . Unspecified essential hypertension 03/27/2007  . GERD 07/28/2006  . BENIGN PROSTATIC HYPERTROPHY  07/28/2006  . NISSEN FUNDOPLICATION, HX OF 3/35/4562    History   Social History  . Marital Status: Married    Spouse Name: N/A    Number of Children: N/A  . Years of Education: N/A   Occupational History  . Not on file.   Social History Main Topics  . Smoking status: Former Research scientist (life sciences)  . Smokeless tobacco: Never Used  . Alcohol Use: No     Comment: rarely  . Drug Use: No  . Sexual Activity: Not on file   Other Topics Concern  . Not on file   Social History Narrative    Past Surgical History  Procedure Laterality Date  . Tonsillectomy    . Prostatectomy  2009    Family History  Problem Relation Age of Onset  . Heart disease Mother   . Cancer Father     prostate    No Known Allergies  Current Outpatient Prescriptions on File Prior to Visit  Medication Sig Dispense Refill  . ALPRAZolam (XANAX) 0.5 MG tablet TAKE 1 TABLET BY MOUTH TWICE A DAY 60 tablet 2  . aspirin 81 MG tablet Take 81 mg by mouth daily.      Marland Kitchen atorvastatin (LIPITOR) 40 MG tablet TAKE 1 TABLET (40 MG TOTAL) BY MOUTH DAILY. 90 tablet 1  . benazepril-hydrochlorthiazide (LOTENSIN HCT) 20-12.5 MG per tablet TAKE 1 TABLET BY MOUTH DAILY. 90 tablet 1  . omeprazole (PRILOSEC)  20 MG capsule TAKE ONE CAPSULE BY MOUTH TWICE A DAY 60 capsule 1   No current facility-administered medications on file prior to visit.    BP 130/90 mmHg  Pulse 70  Temp(Src) 98.1 F (36.7 C) (Oral)  Resp 20  Ht 6\' 1"  (1.854 m)  Wt 216 lb (97.977 kg)  BMI 28.50 kg/m2  SpO2 99%     Review of Systems  Constitutional: Negative for fever, chills, activity change, appetite change and fatigue.  HENT: Negative for hearing loss, ear pain, congestion, rhinorrhea, sneezing, mouth sores, trouble swallowing, neck pain, neck stiffness, dental problem, voice change, sinus pressure and tinnitus.   Eyes: Negative for photophobia, pain, redness and visual disturbance.  Respiratory: Negative for apnea, cough, choking, chest tightness,  shortness of breath and wheezing.   Cardiovascular: Negative for chest pain, palpitations and leg swelling.  Gastrointestinal: Negative for nausea, vomiting, abdominal pain, diarrhea, constipation, blood in stool, abdominal distention, anal bleeding and rectal pain.  Genitourinary: Negative for dysuria, urgency, frequency, hematuria, flank pain, decreased urine volume, discharge, penile swelling, scrotal swelling, difficulty urinating, genital sores and testicular pain.  Musculoskeletal: Negative for myalgias, back pain, joint swelling, arthralgias and gait problem.  Skin: Negative for color change, rash and wound.  Neurological: Negative for dizziness, tremors, seizures, syncope, facial asymmetry, speech difficulty, weakness, light-headedness, numbness and headaches.  Hematological: Negative for adenopathy. Does not bruise/bleed easily.  Psychiatric/Behavioral: Negative for suicidal ideas, hallucinations, behavioral problems, confusion, sleep disturbance, self-injury, dysphoric mood, decreased concentration and agitation. The patient is not nervous/anxious.        Objective:   Physical Exam  Constitutional: He appears well-developed and well-nourished.  HENT:  Head: Normocephalic and atraumatic.  Right Ear: External ear normal.  Left Ear: External ear normal.  Nose: Nose normal.  Mouth/Throat: Oropharynx is clear and moist.  Eyes: Conjunctivae and EOM are normal. Pupils are equal, round, and reactive to light. No scleral icterus.  Neck: Normal range of motion. Neck supple. No JVD present. No thyromegaly present.  Cardiovascular: Regular rhythm, normal heart sounds and intact distal pulses.  Exam reveals no gallop and no friction rub.   No murmur heard. Pulmonary/Chest: Effort normal and breath sounds normal. He exhibits no tenderness.  Abdominal: Soft. Bowel sounds are normal. He exhibits no distension and no mass. There is no tenderness.  Genitourinary: Penis normal.  Musculoskeletal:  Normal range of motion. He exhibits no edema and no tenderness.  Lymphadenopathy:    He has no cervical adenopathy.  Neurological: He is alert. He has normal reflexes. No cranial nerve deficit. Coordination normal.  Skin: Skin is warm and dry. No rash noted.  Psychiatric: He has a normal mood and affect. His behavior is normal.          Assessment & Plan:   Preventive health examination Hypertension stable Dyslipidemia stable History prostate cancer status post prostatectomy.  We'll continue present regimen his lipid profile is under very nice control we'll continue  efforts at lifestyle. Exercise weight loss all encouraged we'll recheck in one year  Review of Systems  Constitutional: Negative for fever, chills, activity change, appetite change and fatigue.  HENT: Negative for congestion, dental problem, ear pain, hearing loss, mouth sores, rhinorrhea, sinus pressure, sneezing, tinnitus, trouble swallowing and voice change.   Eyes: Negative for photophobia, pain, redness and visual disturbance.  Respiratory: Negative for apnea, cough, choking, chest tightness, shortness of breath and wheezing.   Cardiovascular: Negative for chest pain, palpitations and leg swelling.  Gastrointestinal: Negative for nausea,  vomiting, abdominal pain, diarrhea, constipation, blood in stool, abdominal distention, anal bleeding and rectal pain.  Genitourinary: Negative for dysuria, urgency, frequency, hematuria, flank pain, decreased urine volume, discharge, penile swelling, scrotal swelling, difficulty urinating, genital sores and testicular pain.  Musculoskeletal: Negative for arthralgias, back pain, gait problem, joint swelling, myalgias, neck pain and neck stiffness.  Skin: Negative for color change, rash and wound.  Neurological: Negative for dizziness, tremors, seizures, syncope, facial asymmetry, speech difficulty, weakness, light-headedness, numbness and headaches.  Hematological: Negative for  adenopathy. Does not bruise/bleed easily.  Psychiatric/Behavioral: Negative for suicidal ideas, hallucinations, behavioral problems, confusion, sleep disturbance, self-injury, dysphoric mood, decreased concentration and agitation. The patient is not nervous/anxious.        Objective:   Physical Exam  Constitutional: He appears well-developed and well-nourished.  HENT:  Head: Normocephalic and atraumatic.  Right Ear: External ear normal.  Left Ear: External ear normal.  Nose: Nose normal.  Mouth/Throat: Oropharynx is clear and moist.  Eyes: Conjunctivae and EOM are normal. Pupils are equal, round, and reactive to light. No scleral icterus.  Neck: Normal range of motion. Neck supple. No JVD present. No thyromegaly present.  Cardiovascular: Regular rhythm, normal heart sounds and intact distal pulses.  Exam reveals no gallop and no friction rub.   No murmur heard. Pulmonary/Chest: Effort normal and breath sounds normal. He exhibits no tenderness.  Abdominal: Soft. Bowel sounds are normal. He exhibits no distension and no mass. There is no tenderness.  Genitourinary: Penis normal.  Musculoskeletal: Normal range of motion. He exhibits no edema and no tenderness.  Lymphadenopathy:    He has no cervical adenopathy.  Neurological: He is alert. He has normal reflexes. No cranial nerve deficit. Coordination normal.  Skin: Skin is warm and dry. No rash noted.  Psychiatric: He has a normal mood and affect. His behavior is normal.          Assessment & Plan:   Preventive health exam Exogenous obesity. He continues to improve annually with better eating habits Hypertension well controlled Dyslipidemia well controlled  Continue present efforts at additional weight loss. Goal is less than 200 pounds. Recheck in one year medications refilled    Review of Systems    as above Objective:   Physical Exam  As above      Assessment & Plan:  As above

## 2014-02-26 MED ORDER — ALPRAZOLAM 0.5 MG PO TABS: 0.5000 mg | ORAL_TABLET | Freq: Two times a day (BID) | ORAL | Status: DC

## 2014-05-01 ENCOUNTER — Other Ambulatory Visit: Payer: Self-pay | Admitting: Internal Medicine

## 2014-07-05 ENCOUNTER — Other Ambulatory Visit: Payer: Self-pay | Admitting: Internal Medicine

## 2014-08-12 ENCOUNTER — Other Ambulatory Visit: Payer: Self-pay | Admitting: Internal Medicine

## 2014-08-19 ENCOUNTER — Other Ambulatory Visit: Payer: Self-pay | Admitting: Internal Medicine

## 2014-08-22 ENCOUNTER — Emergency Department (HOSPITAL_BASED_OUTPATIENT_CLINIC_OR_DEPARTMENT_OTHER)
Admission: EM | Admit: 2014-08-22 | Discharge: 2014-08-22 | Disposition: A | Discharge status: Home / Self Care | Payer: 59 | Attending: Emergency Medicine | Admitting: Emergency Medicine

## 2014-08-22 ENCOUNTER — Encounter (HOSPITAL_BASED_OUTPATIENT_CLINIC_OR_DEPARTMENT_OTHER): Payer: Self-pay | Admitting: *Deleted

## 2014-08-22 DIAGNOSIS — Z7982 Long term (current) use of aspirin: Secondary | ICD-10-CM | POA: Diagnosis not present

## 2014-08-22 DIAGNOSIS — Z87448 Personal history of other diseases of urinary system: Secondary | ICD-10-CM | POA: Insufficient documentation

## 2014-08-22 DIAGNOSIS — W228XXA Striking against or struck by other objects, initial encounter: Secondary | ICD-10-CM | POA: Diagnosis not present

## 2014-08-22 DIAGNOSIS — Y9289 Other specified places as the place of occurrence of the external cause: Secondary | ICD-10-CM | POA: Diagnosis not present

## 2014-08-22 DIAGNOSIS — K219 Gastro-esophageal reflux disease without esophagitis: Secondary | ICD-10-CM | POA: Diagnosis not present

## 2014-08-22 DIAGNOSIS — S7012XA Contusion of left thigh, initial encounter: Secondary | ICD-10-CM | POA: Insufficient documentation

## 2014-08-22 DIAGNOSIS — I10 Essential (primary) hypertension: Secondary | ICD-10-CM | POA: Diagnosis not present

## 2014-08-22 DIAGNOSIS — Y92410 Unspecified street and highway as the place of occurrence of the external cause: Secondary | ICD-10-CM | POA: Diagnosis not present

## 2014-08-22 DIAGNOSIS — Z87891 Personal history of nicotine dependence: Secondary | ICD-10-CM | POA: Diagnosis not present

## 2014-08-22 DIAGNOSIS — Z79899 Other long term (current) drug therapy: Secondary | ICD-10-CM | POA: Insufficient documentation

## 2014-08-22 DIAGNOSIS — Z8546 Personal history of malignant neoplasm of prostate: Secondary | ICD-10-CM | POA: Insufficient documentation

## 2014-08-22 DIAGNOSIS — Y9389 Activity, other specified: Secondary | ICD-10-CM | POA: Insufficient documentation

## 2014-08-22 DIAGNOSIS — Y998 Other external cause status: Secondary | ICD-10-CM | POA: Diagnosis not present

## 2014-08-22 DIAGNOSIS — R58 Hemorrhage, not elsewhere classified: Secondary | ICD-10-CM

## 2014-08-22 LAB — CBC WITH DIFFERENTIAL/PLATELET
Basophils Absolute: 0 10*3/uL (ref 0.0–0.1)
Basophils Relative: 0 % (ref 0–1)
Eosinophils Absolute: 0.1 10*3/uL (ref 0.0–0.7)
Eosinophils Relative: 1 % (ref 0–5)
HCT: 41.6 % (ref 39.0–52.0)
Hemoglobin: 13.9 g/dL (ref 13.0–17.0)
Lymphocytes Relative: 32 % (ref 12–46)
Lymphs Abs: 2.6 10*3/uL (ref 0.7–4.0)
MCH: 29.8 pg (ref 26.0–34.0)
MCHC: 33.4 g/dL (ref 30.0–36.0)
MCV: 89.3 fL (ref 78.0–100.0)
Monocytes Absolute: 0.8 10*3/uL (ref 0.1–1.0)
Monocytes Relative: 9 % (ref 3–12)
Neutro Abs: 4.7 10*3/uL (ref 1.7–7.7)
Neutrophils Relative %: 58 % (ref 43–77)
Platelets: 163 10*3/uL (ref 150–400)
RBC: 4.66 MIL/uL (ref 4.22–5.81)
RDW: 12.9 % (ref 11.5–15.5)
WBC: 8.2 10*3/uL (ref 4.0–10.5)

## 2014-08-22 NOTE — ED Notes (Signed)
Large bruise on his left upper leg. It started as a small, sore knot 4 days ago. Nausea in the past hour.

## 2014-08-22 NOTE — ED Provider Notes (Signed)
CSN: [id number]     Arrival date & time 08/22/14  1813 History   First MD Initiated Contact with Patient 08/22/14 1834     Chief Complaint  Patient presents with  . Bruise on his leg      (Consider location/radiation/quality/duration/timing/severity/associated sxs/prior Treatment) HPI Christopher [name] is a 64 y.o. male comes in for evaluation of bruise on his left thigh. 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 He reports soreness to this site that has since resolved. He noticed yesterday a bruise develop at the site. He denies any chest pain, shortness of breath, leg swelling. Does not take any anti-coagulation other than a 81 mg aspirin daily. Denies any pain or discomfort now. No other aggravating or modifying factors.  Past Medical History  Diagnosis Date  . NEOPLASM, MALIGNANT, PROSTATE 08/01/2007  . HYPERLIPIDEMIA 07/28/2006  . Unspecified essential hypertension 03/27/2007  . GERD 07/28/2006  . BENIGN PROSTATIC HYPERTROPHY 07/28/2006  . NISSEN FUNDOPLICATION, HX OF 3/73/4287   Past Surgical History  Procedure Laterality Date  . Tonsillectomy    . Prostatectomy  2009   Family History  Problem Relation Age of Onset  . Heart disease Mother   . Cancer Father     prostate   History  Substance Use Topics  . Smoking status: Former Research scientist (life sciences)  . Smokeless tobacco: Never Used  . Alcohol Use: No     Comment: rarely    Review of Systems A 10 point review of systems was completed and was negative except for pertinent positives and negatives as mentioned in the history of present illness     Allergies  Review of patient's allergies indicates no known allergies.  Home Medications   Prior to Admission medications   Medication Sig Start Date End Date Taking? Authorizing Provider  ALPRAZolam Duanne Moron) 0.5 MG tablet TAKE 1 TABLET BY MOUTH TWICE DAILY 08/19/14    Marletta Lor, MD  aspirin 81 MG tablet Take 81 mg by mouth daily.      Historical Provider, MD  atorvastatin (LIPITOR) 40 MG tablet TAKE 1 TABLET BY MOUTH ONCE DAILY 07/05/14   Marletta Lor, MD  benazepril-hydrochlorthiazide (LOTENSIN HCT) 20-12.5 MG per tablet TAKE 1 TABLET BY MOUTH ONCE DAILY 08/12/14   Marletta Lor, MD  omeprazole (PRILOSEC) 20 MG capsule TAKE ONE CAPSULE BY MOUTH TWICE A DAY 12/14/13   Marin Olp, MD   BP 125/88 mmHg  Pulse 73  Temp(Src) 97.6 F (36.4 C) (Oral)  Resp 20  Ht 6\' 2"  (1.88 m)  Wt 212 lb (96.163 kg)  BMI 27.21 kg/m2  SpO2 98% Physical Exam  Constitutional: He is oriented to person, place, and time. He appears well-developed and well-nourished.  HENT:  Head: Normocephalic and atraumatic.  Mouth/Throat: Oropharynx is clear and moist.  Eyes: Conjunctivae are normal. Pupils are equal, round, and reactive to light. Right eye exhibits no discharge. Left eye exhibits no discharge. No scleral icterus.  Neck: Neck supple.  Cardiovascular: Normal rate, regular rhythm and normal heart sounds.   Pulmonary/Chest: Effort normal and breath sounds normal. No respiratory distress. He has no wheezes. He has no rales.  Abdominal: Soft. There is no tenderness.  Musculoskeletal: He exhibits no tenderness.  Neurological: He is alert and oriented to person, place, and time.  Cranial Nerves II-XII grossly intact  Skin: Skin is  warm and dry. No rash noted.  Superficial ecchymosis noted to medial/posterior aspect of left thigh. Ranges approximately 9-10" x 4". No tenderness or discomfort. No erythema or appreciable edema. Negative Homans sign. Muscle compartments are soft. Full active range of motion of bilateral lower extremities.  Psychiatric: He has a normal mood and affect.  Nursing note and vitals reviewed.   ED Course  Procedures (including critical care time) Labs Review Labs Reviewed  CBC WITH DIFFERENTIAL/PLATELET    Imaging Review No  results found.   EKG Interpretation None     Meds given in ED:  Medications - No data to display  New Prescriptions   No medications on file   Filed Vitals:   08/22/14 1820  BP: 125/88  Pulse: 73  Temp: 97.6 F (36.4 C)  TempSrc: Oral  Resp: 20  Height: 6\' 2"  (1.88 m)  Weight: 212 lb (96.163 kg)  SpO2: 98%    MDM  Vitals stable - WNL -afebrile, saturations are 98% on room air Pt resting comfortably in ED. patient is well-appearing and appears to be an overall good health. PE--physical exam is not concerning for DVT. Benign lung exam. Physical exam is otherwise grossly normal. Labwork-labs baseline essentially noncontributory.  DDX--patient with mild superficial ecchymosis to medial left thigh. Doubt any emergent vascular process. Low wells DVT score. No evidence of thrombocytopenia. Likely due to repetitive irritation from seat cushion. Provided reassurance to patient and encourage follow-up with PCP for further evaluation and management of symptoms. I discussed all relevant lab findings and imaging results with pt and they verbalized understanding. Discussed f/u with PCP within 48 hrs and return precautions, pt very amenable to plan.  Final diagnoses:  Ecchymosis       Comer Locket, PA-C 08/22/14 2029  Pattricia Boss, MD 08/22/14 2110

## 2014-08-22 NOTE — Discharge Instructions (Signed)
You were evaluated in the ED today for the bruise on her leg. There does not appear to be an emergent cause for your symptoms at this time. Your labs and exam today were very reassuring. All of your blood counts were normal. It is important to follow up with your primary care for further evaluation and management of your symptoms. Return to ED for new or worsening symptoms.

## 2014-11-12 ENCOUNTER — Telehealth: Payer: Self-pay | Admitting: Internal Medicine

## 2014-11-12 NOTE — Telephone Encounter (Signed)
Pt requested a  Referral  To dr Claudean Kinds-:    Pt has a different MD  name on his insurance card he has to get this changed so we can authorized his visit . Called Dr hollard office to inform of this - left msg on vm on pt phone to explain  [facility] Provider Name Provider Number:   [id number]  Phone Number:   [phone]  Address:   [address], [place], [place] [address]  Start Date:   06/22/2014

## 2014-11-13 ENCOUNTER — Telehealth: Payer: Self-pay | Admitting: Internal Medicine

## 2014-11-13 NOTE — Telephone Encounter (Signed)
Pt has conformation number to see his eye  Dr, Dr Ralene Bathe Two process conformation number per [facility]:     [phone]

## 2014-12-24 ENCOUNTER — Other Ambulatory Visit (INDEPENDENT_AMBULATORY_CARE_PROVIDER_SITE_OTHER): Payer: 59

## 2014-12-24 DIAGNOSIS — Z0000 Encounter for general adult medical examination without abnormal findings: Secondary | ICD-10-CM | POA: Diagnosis not present

## 2014-12-24 LAB — HEPATIC FUNCTION PANEL
ALT: 23 U/L (ref 0–53)
AST: 21 U/L (ref 0–37)
Albumin: 4.3 g/dL (ref 3.5–5.2)
Alkaline Phosphatase: 84 U/L (ref 39–117)
Bilirubin, Direct: 0.1 mg/dL (ref 0.0–0.3)
Total Bilirubin: 0.6 mg/dL (ref 0.2–1.2)
Total Protein: 6.3 g/dL (ref 6.0–8.3)

## 2014-12-24 LAB — BASIC METABOLIC PANEL
BUN: 16 mg/dL (ref 6–23)
CO2: 27 mEq/L (ref 19–32)
Calcium: 9.5 mg/dL (ref 8.4–10.5)
Chloride: 104 mEq/L (ref 96–112)
Creatinine, Ser: 0.84 mg/dL (ref 0.40–1.50)
GFR: 97.65 mL/min (ref >60.00)
Glucose, Bld: 93 mg/dL (ref 70–99)
Potassium: 4.8 mEq/L (ref 3.5–5.1)
Sodium: 138 mEq/L (ref 135–145)

## 2014-12-24 LAB — POCT URINALYSIS DIPSTICK
Bilirubin, UA: NEGATIVE
Blood, UA: NEGATIVE
Glucose, UA: NEGATIVE
Ketones, UA: NEGATIVE
Leukocytes, UA: NEGATIVE
Nitrite, UA: NEGATIVE
Spec Grav, UA: 1.015
Urobilinogen, UA: 0.2
pH, UA: 7.5

## 2014-12-24 LAB — CBC WITH DIFFERENTIAL/PLATELET
Basophils Absolute: 0 10*3/uL (ref 0.0–0.1)
Basophils Relative: 0.5 % (ref 0.0–3.0)
Eosinophils Absolute: 0.1 10*3/uL (ref 0.0–0.7)
Eosinophils Relative: 1.2 % (ref 0.0–5.0)
HCT: 46.5 % (ref 39.0–52.0)
Hemoglobin: 15.5 g/dL (ref 13.0–17.0)
Lymphocytes Relative: 29.1 % (ref 12.0–46.0)
Lymphs Abs: 2.4 10*3/uL (ref 0.7–4.0)
MCHC: 33.3 g/dL (ref 30.0–36.0)
MCV: 89.3 fl (ref 78.0–100.0)
Monocytes Absolute: 0.7 10*3/uL (ref 0.1–1.0)
Monocytes Relative: 8 % (ref 3.0–12.0)
Neutro Abs: 5 10*3/uL (ref 1.4–7.7)
Neutrophils Relative %: 61.2 % (ref 43.0–77.0)
Platelets: 167 10*3/uL (ref 150.0–400.0)
RBC: 5.2 Mil/uL (ref 4.22–5.81)
RDW: 13.3 % (ref 11.5–15.5)
WBC: 8.1 10*3/uL (ref 4.0–10.5)

## 2014-12-24 LAB — LIPID PANEL
Cholesterol: 136 mg/dL (ref 0–200)
HDL: 61.6 mg/dL (ref >39.00)
LDL Cholesterol: 62 mg/dL (ref 0–99)
NonHDL: 74.89
Total CHOL/HDL Ratio: 2
Triglycerides: 63 mg/dL (ref 0.0–149.0)
VLDL: 12.6 mg/dL (ref 0.0–40.0)

## 2014-12-24 LAB — PSA: PSA: 0 ng/mL — ABNORMAL LOW (ref 0.10–4.00)

## 2014-12-24 LAB — TSH: TSH: 1.07 u[IU]/mL (ref 0.35–4.50)

## 2015-01-01 ENCOUNTER — Ambulatory Visit (INDEPENDENT_AMBULATORY_CARE_PROVIDER_SITE_OTHER): Payer: 59 | Admitting: Internal Medicine

## 2015-01-01 ENCOUNTER — Encounter: Payer: Self-pay | Admitting: Internal Medicine

## 2015-01-01 VITALS — BP 136/80 | HR 69 | Temp 97.8°F | Resp 20 | Ht 72.5 in | Wt 215.0 lb

## 2015-01-01 DIAGNOSIS — Z299 Encounter for prophylactic measures, unspecified: Secondary | ICD-10-CM

## 2015-01-01 DIAGNOSIS — I10 Essential (primary) hypertension: Secondary | ICD-10-CM

## 2015-01-01 DIAGNOSIS — Z0000 Encounter for general adult medical examination without abnormal findings: Secondary | ICD-10-CM

## 2015-01-01 DIAGNOSIS — E785 Hyperlipidemia, unspecified: Secondary | ICD-10-CM | POA: Diagnosis not present

## 2015-01-01 MED ORDER — BENAZEPRIL-HYDROCHLOROTHIAZIDE 20-12.5 MG PO TABS: 1.0000 | ORAL_TABLET | Freq: Every day | ORAL | Status: DC

## 2015-01-01 MED ORDER — ATORVASTATIN CALCIUM 40 MG PO TABS: 40.0000 mg | ORAL_TABLET | Freq: Every day | ORAL | Status: DC

## 2015-01-01 MED ORDER — ALPRAZOLAM 0.5 MG PO TABS: 0.5000 mg | ORAL_TABLET | Freq: Two times a day (BID) | ORAL | Status: DC

## 2015-01-01 NOTE — Patient Instructions (Signed)
Limit your sodium (Salt) intake  Please check your blood pressure on a regular basis.  If it is consistently greater than 150/90, please make an office appointment.  Schedule your colonoscopy to help detect colon cancer.      Health Maintenance, Male A healthy lifestyle and preventative care can promote health and wellness.  Maintain regular health, dental, and eye exams.  Eat a healthy diet. Foods like vegetables, fruits, whole grains, low-fat dairy products, and lean protein foods contain the nutrients you need and are low in calories. Decrease your intake of foods high in solid fats, added sugars, and salt. Get information about a proper diet from your health care provider, if necessary.  Regular physical exercise is one of the most important things you can do for your health. Most adults should get at least 150 minutes of moderate-intensity exercise (any activity that increases your heart rate and causes you to sweat) each week. In addition, most adults need muscle-strengthening exercises on 2 or more days a week.   Maintain a healthy weight. The body mass index (BMI) is a screening tool to identify possible weight problems. It provides an estimate of body fat based on height and weight. Your health care provider can find your BMI and can help you achieve or maintain a healthy weight. For males 20 years and older:  A BMI below 18.5 is considered underweight.  A BMI of 18.5 to 24.9 is normal.  A BMI of 25 to 29.9 is considered overweight.  A BMI of 30 and above is considered obese.  Maintain normal blood lipids and cholesterol by exercising and minimizing your intake of saturated fat. Eat a balanced diet with plenty of fruits and vegetables. Blood tests for lipids and cholesterol should begin at age 38 and be repeated every 5 years. If your lipid or cholesterol levels are high, you are over age 71, or you are at high risk for heart disease, you may need your cholesterol levels  checked more frequently.Ongoing high lipid and cholesterol levels should be treated with medicines if diet and exercise are not working.  If you smoke, find out from your health care provider how to quit. If you do not use tobacco, do not start.  Lung cancer screening is recommended for adults aged 26-80 years who are at high risk for developing lung cancer because of a history of smoking. A yearly low-dose CT scan of the lungs is recommended for people who have at least a 30-pack-year history of smoking and are current smokers or have quit within the past 15 years. A pack year of smoking is smoking an average of 1 pack of cigarettes a day for 1 year (for example, a 30-pack-year history of smoking could mean smoking 1 pack a day for 30 years or 2 packs a day for 15 years). Yearly screening should continue until the smoker has stopped smoking for at least 15 years. Yearly screening should be stopped for people who develop a health problem that would prevent them from having lung cancer treatment.  If you choose to drink alcohol, do not have more than 2 drinks per day. One drink is considered to be 12 oz (360 mL) of beer, 5 oz (150 mL) of wine, or 1.5 oz (45 mL) of liquor.  Avoid the use of street drugs. Do not share needles with anyone. Ask for help if you need support or instructions about stopping the use of drugs.  High blood pressure causes heart disease and increases the  risk of stroke. High blood pressure is more likely to develop in:  People who have blood pressure in the end of the normal range (100-139/85-89 mm Hg).  People who are overweight or obese.  People who are African American.  If you are 65-59 years of age, have your blood pressure checked every 3-5 years. If you are 62 years of age or older, have your blood pressure checked every year. You should have your blood pressure measured twice--once when you are at a hospital or clinic, and once when you are not at a hospital or clinic.  Record the average of the two measurements. To check your blood pressure when you are not at a hospital or clinic, you can use:  An automated blood pressure machine at a pharmacy.  A home blood pressure monitor.  If you are 73-22 years old, ask your health care provider if you should take aspirin to prevent heart disease.  Diabetes screening involves taking a blood sample to check your fasting blood sugar level. This should be done once every 3 years after age 74 if you are at a normal weight and without risk factors for diabetes. Testing should be considered at a younger age or be carried out more frequently if you are overweight and have at least 1 risk factor for diabetes.  Colorectal cancer can be detected and often prevented. Most routine colorectal cancer screening begins at the age of 60 and continues through age 36. However, your health care provider may recommend screening at an earlier age if you have risk factors for colon cancer. On a yearly basis, your health care provider may provide home test kits to check for hidden blood in the stool. A small camera at the end of a tube may be used to directly examine the colon (sigmoidoscopy or colonoscopy) to detect the earliest forms of colorectal cancer. Talk to your health care provider about this at age 9 when routine screening begins. A direct exam of the colon should be repeated every 5-10 years through age 6, unless early forms of precancerous polyps or small growths are found.  People who are at an increased risk for hepatitis B should be screened for this virus. You are considered at high risk for hepatitis B if:  You were born in a country where hepatitis B occurs often. Talk with your health care provider about which countries are considered high risk.  Your parents were born in a high-risk country and you have not received a shot to protect against hepatitis B (hepatitis B vaccine).  You have HIV or AIDS.  You use needles to  inject street drugs.  You live with, or have sex with, someone who has hepatitis B.  You are a man who has sex with other men (MSM).  You get hemodialysis treatment.  You take certain medicines for conditions like cancer, organ transplantation, and autoimmune conditions.  Hepatitis C blood testing is recommended for all people born from [birth year] through 1965 and any individual with known risk factors for hepatitis C.  Healthy men should no longer receive prostate-specific antigen (PSA) blood tests as part of routine cancer screening. Talk to your health care provider about prostate cancer screening.  Testicular cancer screening is not recommended for adolescents or adult males who have no symptoms. Screening includes self-exam, a health care provider exam, and other screening tests. Consult with your health care provider about any symptoms you have or any concerns you have about testicular cancer.  Practice safe  sex. Use condoms and avoid high-risk sexual practices to reduce the spread of sexually transmitted infections (STIs).  You should be screened for STIs, including gonorrhea and chlamydia if:  You are sexually active and are younger than 24 years.  You are older than 24 years, and your health care provider tells you that you are at risk for this type of infection.  Your sexual activity has changed since you were last screened, and you are at an increased risk for chlamydia or gonorrhea. Ask your health care provider if you are at risk.  If you are at risk of being infected with HIV, it is recommended that you take a prescription medicine daily to prevent HIV infection. This is called pre-exposure prophylaxis (PrEP). You are considered at risk if:  You are a man who has sex with other men (MSM).  You are a heterosexual man who is sexually active with multiple partners.  You take drugs by injection.  You are sexually active with a partner who has HIV.  Talk with your health care  provider about whether you are at high risk of being infected with HIV. If you choose to begin PrEP, you should first be tested for HIV. You should then be tested every 3 months for as long as you are taking PrEP.  Use sunscreen. Apply sunscreen liberally and repeatedly throughout the day. You should seek shade when your shadow is shorter than you. Protect yourself by wearing long sleeves, pants, a wide-brimmed hat, and sunglasses year round whenever you are outdoors.  Tell your health care provider of new moles or changes in moles, especially if there is a change in shape or color. Also, tell your health care provider if a mole is larger than the size of a pencil eraser.  A one-time screening for abdominal aortic aneurysm (AAA) and surgical repair of large AAAs by ultrasound is recommended for men aged 51-75 years who are current or former smokers.  Stay current with your vaccines (immunizations).   This information is not intended to replace advice given to you by your health care provider. Make sure you discuss any questions you have with your health care provider.   Document Released: 08/21/2007 Document Revised: 03/15/2014 Document Reviewed: 07/20/2010 Elsevier Interactive Patient Education [employer].

## 2015-01-01 NOTE — Progress Notes (Signed)
Pre visit review using our clinic review tool, if applicable. No additional management support is needed unless otherwise documented below in the visit note. 

## 2015-01-01 NOTE — Progress Notes (Signed)
Subjective:    Patient ID: Christopher [name], male    DOB: 1950-05-16, 64 y.o.   MRN: [id number]  HPI   Wt Readings from Last 3 Encounters:  01/01/15 215 lb (97.523 kg)  08/22/14 212 lb (96.163 kg)  02/25/14 216 lb (97.977 kg)    Subjective:    Patient ID: Christopher [name], male    DOB: 10/23/1950, 64 y.o.   MRN: [id number]  HPI   Wt Readings from Last 3 Encounters:  01/01/15 215 lb (97.523 kg)  08/22/14 212 lb (96.163 kg)  02/25/14 216 lb (97.977 kg)    Subjective:    Patient ID: Christopher [name], male    DOB: [birth year], 64 y.o.   MRN: [id number]  Pre-visit discussion using our clinic review tool. No additional management support is needed unless otherwise documented below in the visit note.  Wt Readings from Last 3 Encounters:  01/01/15 215 lb (97.523 kg)  08/22/14 212 lb (96.163 kg)  02/25/14 216 lb (97.977 kg)    HPI History of Present Illness:   64  -year-old patient seen today for a wellness exam. Medical problems include hypertension, dyslipidemia, and history of prostate cancer. He is doing quite well. He has been followed by urology in the past but has been discharged  Colonoscopy January 2012  Preventive Screening-Counseling & Management  Alcohol-Tobacco  Smoking Status: quit   Allergies (verified):  No Known Drug Allergies   Past History:   GERD  Hyperlipidemia  Benign prostatic hypertrophy, history of  Hypertension  prostate cancer   Past Surgical History:   Tonsillectomy  sigmoidoscopy 2004  Colonoscopy 2011 prostatectomy February/2009   Family History:   father died age 57, prostate cancer  mother  died 50 history of chronic atrial fibrillation; CVD  Two brothers in good health; s/p benighn brain tumor   Social History:   Married   Past Medical History  Diagnosis Date  . NEOPLASM, MALIGNANT, PROSTATE 08/01/2007  . HYPERLIPIDEMIA 07/28/2006  . Unspecified essential hypertension 03/27/2007  . GERD 07/28/2006  . BENIGN PROSTATIC  HYPERTROPHY 07/28/2006  . NISSEN FUNDOPLICATION, HX OF 9/75/8832    Social History   Social History  . Marital Status: Married    Spouse Name: N/A  . Number of Children: N/A  . Years of Education: N/A   Occupational History  . Not on file.   Social History Main Topics  . Smoking status: Former Research scientist (life sciences)  . Smokeless tobacco: Never Used  . Alcohol Use: No     Comment: rarely  . Drug Use: No  . Sexual Activity: Not on file   Other Topics Concern  . Not on file   Social History Narrative    Past Surgical History  Procedure Laterality Date  . Tonsillectomy    . Prostatectomy  2009    Family History  Problem Relation Age of Onset  . Heart disease Mother   . Cancer Father     prostate    No Known Allergies  Current Outpatient Prescriptions on File Prior to Visit  Medication Sig Dispense Refill  . aspirin 81 MG tablet Take 81 mg by mouth daily.       No current facility-administered medications on file prior to visit.    BP 136/80 mmHg  Pulse 69  Temp(Src) 97.8 F (36.6 C) (Oral)  Resp 20  Ht 6' 0.5" (1.842 m)  Wt 215 lb (97.523 kg)  BMI 28.74 kg/m2  SpO2 98%     Review of Systems  Constitutional:  Negative for fever, chills, activity change, appetite change and fatigue.  HENT: Negative for hearing loss, ear pain, congestion, rhinorrhea, sneezing, mouth sores, trouble swallowing, neck pain, neck stiffness, dental problem, voice change, sinus pressure and tinnitus.   Eyes: Negative for photophobia, pain, redness and visual disturbance.  Respiratory: Negative for apnea, cough, choking, chest tightness, shortness of breath and wheezing.   Cardiovascular: Negative for chest pain, palpitations and leg swelling.  Gastrointestinal: Negative for nausea, vomiting, abdominal pain, diarrhea, constipation, blood in stool, abdominal distention, anal bleeding and rectal pain.  Genitourinary: Negative for dysuria, urgency, frequency, hematuria, flank pain, decreased urine  volume, discharge, penile swelling, scrotal swelling, difficulty urinating, genital sores and testicular pain.  Musculoskeletal: Negative for myalgias, back pain, joint swelling, arthralgias and gait problem.  Skin: Negative for color change, rash and wound.  Neurological: Negative for dizziness, tremors, seizures, syncope, facial asymmetry, speech difficulty, weakness, light-headedness, numbness and headaches.  Hematological: Negative for adenopathy. Does not bruise/bleed easily.  Psychiatric/Behavioral: Negative for suicidal ideas, hallucinations, behavioral problems, confusion, sleep disturbance, self-injury, dysphoric mood, decreased concentration and agitation. The patient is not nervous/anxious.        Objective:   Physical Exam  Constitutional: He appears well-developed and well-nourished.  HENT:  Head: Normocephalic and atraumatic.  Right Ear: External ear normal.  Left Ear: External ear normal.  Nose: Nose normal.  Mouth/Throat: Oropharynx is clear and moist.  Eyes: Conjunctivae and EOM are normal. Pupils are equal, round, and reactive to light. No scleral icterus.  Neck: Normal range of motion. Neck supple. No JVD present. No thyromegaly present.  Cardiovascular: Regular rhythm, normal heart sounds and intact distal pulses.  Exam reveals no gallop and no friction rub.   No murmur heard. Pulmonary/Chest: Effort normal and breath sounds normal. He exhibits no tenderness.  Abdominal: Soft. Bowel sounds are normal. He exhibits no distension and no mass. There is no tenderness.  Genitourinary: Penis normal.  Musculoskeletal: Normal range of motion. He exhibits no edema and no tenderness.  Lymphadenopathy:    He has no cervical adenopathy.  Neurological: He is alert. He has normal reflexes. No cranial nerve deficit. Coordination normal.  Skin: Skin is warm and dry. No rash noted.  Psychiatric: He has a normal mood and affect. His behavior is normal.          Assessment &  Plan:   Preventive health examination Hypertension stable Dyslipidemia stable History prostate cancer status post prostatectomy.  We'll continue present regimen his lipid profile is under very nice control we'll continue  efforts at lifestyle. Exercise weight loss all encouraged we'll recheck in one year  Review of Systems  Constitutional: Negative for fever, chills, activity change, appetite change and fatigue.  HENT: Negative for congestion, dental problem, ear pain, hearing loss, mouth sores, rhinorrhea, sinus pressure, sneezing, tinnitus, trouble swallowing and voice change.   Eyes: Negative for photophobia, pain, redness and visual disturbance.  Respiratory: Negative for apnea, cough, choking, chest tightness, shortness of breath and wheezing.   Cardiovascular: Negative for chest pain, palpitations and leg swelling.  Gastrointestinal: Negative for nausea, vomiting, abdominal pain, diarrhea, constipation, blood in stool, abdominal distention, anal bleeding and rectal pain.  Genitourinary: Negative for dysuria, urgency, frequency, hematuria, flank pain, decreased urine volume, discharge, penile swelling, scrotal swelling, difficulty urinating, genital sores and testicular pain.  Musculoskeletal: Negative for arthralgias, back pain, gait problem, joint swelling, myalgias, neck pain and neck stiffness.  Skin: Negative for color change, rash and wound.  Neurological: Negative for dizziness, tremors,  seizures, syncope, facial asymmetry, speech difficulty, weakness, light-headedness, numbness and headaches.  Hematological: Negative for adenopathy. Does not bruise/bleed easily.  Psychiatric/Behavioral: Negative for suicidal ideas, hallucinations, behavioral problems, confusion, sleep disturbance, self-injury, dysphoric mood, decreased concentration and agitation. The patient is not nervous/anxious.        Objective:   Physical Exam  Constitutional: He appears well-developed and  well-nourished.  HENT:  Head: Normocephalic and atraumatic.  Right Ear: External ear normal.  Left Ear: External ear normal.  Nose: Nose normal.  Mouth/Throat: Oropharynx is clear and moist.  Eyes: Conjunctivae and EOM are normal. Pupils are equal, round, and reactive to light. No scleral icterus.  Neck: Normal range of motion. Neck supple. No JVD present. No thyromegaly present.  Cardiovascular: Regular rhythm, normal heart sounds and intact distal pulses.  Exam reveals no gallop and no friction rub.   No murmur heard. Pulmonary/Chest: Effort normal and breath sounds normal. He exhibits no tenderness.  Abdominal: Soft. Bowel sounds are normal. He exhibits no distension and no mass. There is no tenderness.  Genitourinary: Penis normal.  Musculoskeletal: Normal range of motion. He exhibits no edema and no tenderness.  Lymphadenopathy:    He has no cervical adenopathy.  Neurological: He is alert. He has normal reflexes. No cranial nerve deficit. Coordination normal.  Skin: Skin is warm and dry. No rash noted.  Psychiatric: He has a normal mood and affect. His behavior is normal.          Assessment & Plan:   Preventive health exam Exogenous obesity. He continues to improve annually with better eating habits Hypertension well controlled Dyslipidemia well controlled  Continue present efforts at additional weight loss. Goal is less than 200 pounds. Recheck in one year medications refilled    Review of Systems     as above Objective:   Physical Exam   As above      Assessment & Plan:  As above

## 2015-01-08 ENCOUNTER — Other Ambulatory Visit: Payer: BC Managed Care – PPO

## 2015-01-15 ENCOUNTER — Encounter: Payer: BC Managed Care – PPO | Admitting: Internal Medicine

## 2015-02-28 ENCOUNTER — Other Ambulatory Visit: Payer: Self-pay | Admitting: Internal Medicine

## 2015-04-30 ENCOUNTER — Encounter: Payer: Self-pay | Admitting: Internal Medicine

## 2015-07-11 ENCOUNTER — Other Ambulatory Visit: Payer: Self-pay | Admitting: Internal Medicine

## 2015-07-22 ENCOUNTER — Encounter: Payer: Self-pay | Admitting: Internal Medicine

## 2015-08-01 ENCOUNTER — Ambulatory Visit (AMBULATORY_SURGERY_CENTER): Payer: Self-pay

## 2015-08-01 VITALS — Ht 73.0 in | Wt 215.2 lb

## 2015-08-01 DIAGNOSIS — Z860100 Personal history of colon polyps, unspecified: Secondary | ICD-10-CM

## 2015-08-01 MED ORDER — SUPREP BOWEL PREP KIT 17.5-3.13-1.6 GM/177ML PO SOLN: 1.0000 | Freq: Once | ORAL | Status: DC

## 2015-08-01 NOTE — Progress Notes (Signed)
No allergies to eggs or soy No past problems with anesthesia No home oxygen No diet meds  Has email and internet; declined emmi 

## 2015-08-08 ENCOUNTER — Encounter: Payer: Self-pay | Admitting: Internal Medicine

## 2015-08-18 ENCOUNTER — Ambulatory Visit (AMBULATORY_SURGERY_CENTER): Payer: PPO | Admitting: Internal Medicine

## 2015-08-18 ENCOUNTER — Encounter: Payer: Self-pay | Admitting: Internal Medicine

## 2015-08-18 VITALS — BP 137/91 | HR 74 | Temp 98.9°F | Resp 12 | Ht 73.0 in | Wt 215.0 lb

## 2015-08-18 DIAGNOSIS — I10 Essential (primary) hypertension: Secondary | ICD-10-CM | POA: Diagnosis not present

## 2015-08-18 DIAGNOSIS — Z86010 Personal history of colonic polyps: Secondary | ICD-10-CM

## 2015-08-18 DIAGNOSIS — F419 Anxiety disorder, unspecified: Secondary | ICD-10-CM | POA: Diagnosis not present

## 2015-08-18 DIAGNOSIS — E669 Obesity, unspecified: Secondary | ICD-10-CM | POA: Diagnosis not present

## 2015-08-18 MED ORDER — SODIUM CHLORIDE 0.9 % IV SOLN: 500.0000 mL | INTRAVENOUS | Status: DC

## 2015-08-18 NOTE — Progress Notes (Signed)
Report to PACU, RN, vss, BBS= Clear.  

## 2015-08-18 NOTE — Op Note (Signed)
[facility] Patient Name: Christopher [name] Procedure Date: 08/18/2015 2:48 PM MRN: [id number] Endoscopist: Docia Chuck. Henrene Pastor , MD Age: 65 Referring MD:  Date of Birth: [birth year] Gender: Male Procedure:                Colonoscopy Indications:              Surveillance: Personal history of adenomatous                            polyps on last colonoscopy 5 years ago, High risk                            colon cancer surveillance: Personal history of                            non-advanced adenoma Medicines:                Monitored Anesthesia Care Procedure:                Pre-Anesthesia Assessment:                           - Prior to the procedure, a History and Physical                            was performed, and patient medications and                            allergies were reviewed. The patient's tolerance of                            previous anesthesia was also reviewed. The risks                            and benefits of the procedure and the sedation                            options and risks were discussed with the patient.                            All questions were answered, and informed consent                            was obtained. Prior Anticoagulants: The patient has                            taken no previous anticoagulant or antiplatelet                            agents. ASA Grade Assessment: II - A patient with                            mild systemic disease. After reviewing the risks  and benefits, the patient was deemed in                            satisfactory condition to undergo the procedure.                           After obtaining informed consent, the colonoscope                            was passed under direct vision. Throughout the                            procedure, the patient's blood pressure, pulse, and                            oxygen saturations were monitored continuously. The       Model CF-HQ190L [id number]) scope was introduced                            through the anus and advanced to the the cecum,                            identified by appendiceal orifice and ileocecal                            valve. The ileocecal valve, appendiceal orifice,                            and rectum were photographed. The quality of the                            bowel preparation was excellent. The colonoscopy                            was performed without difficulty. The patient                            tolerated the procedure well. The bowel preparation                            used was SUPREP. Scope In: 2:57:50 PM Scope Out: 3:16:58 PM Scope Withdrawal Time: 0 hours 14 minutes 59 seconds  Total Procedure Duration: 0 hours 19 minutes 8 seconds  Findings:                 Multiple diverticula were found in the left colon.                           Internal hemorrhoids were found during retroflexion.                           The exam was otherwise without abnormality on                            direct and retroflexion  views. Complications:            No immediate complications. Estimated blood loss:                            None. Estimated Blood Loss:     Estimated blood loss: none. Impression:               - Diverticulosis in the left colon.                           - Internal hemorrhoids.                           - The examination was otherwise normal on direct                            and retroflexion views.                           - No specimens collected. Recommendation:           - Repeat colonoscopy in 10 years for surveillance.                           - Patient has a contact number available for                            emergencies. The signs and symptoms of potential                            delayed complications were discussed with the                            patient. Return to normal activities tomorrow.                             Written discharge instructions were provided to the                            patient.                           - Resume previous diet.                           - Continue present medications. Docia Chuck. Henrene Pastor, MD 08/18/2015 3:20:29 PM This report has been signed electronically. CC Letter to:             Marletta Lor

## 2015-08-18 NOTE — Patient Instructions (Signed)
YOU HAD AN ENDOSCOPIC PROCEDURE TODAY AT THE [facility]:   Refer to the procedure report that was given to you for any specific questions about what was found during the examination.  If the procedure report does not answer your questions, please call your gastroenterologist to clarify.  If you requested that your care partner not be given the details of your procedure findings, then the procedure report has been included in a sealed envelope for you to review at your convenience later.  YOU SHOULD EXPECT: Some feelings of bloating in the abdomen. Passage of more gas than usual.  Walking can help get rid of the air that was put into your GI tract during the procedure and reduce the bloating. If you had a lower endoscopy (such as a colonoscopy or flexible sigmoidoscopy) you may notice spotting of blood in your stool or on the toilet paper. If you underwent a bowel prep for your procedure, you may not have a normal bowel movement for a few days.  Please Note:  You might notice some irritation and congestion in your nose or some drainage.  This is from the oxygen used during your procedure.  There is no need for concern and it should clear up in a day or so.  SYMPTOMS TO REPORT IMMEDIATELY:   Following lower endoscopy (colonoscopy or flexible sigmoidoscopy):  Excessive amounts of blood in the stool  Significant tenderness or worsening of abdominal pains  Swelling of the abdomen that is new, acute  Fever of 100F or higher    For urgent or emergent issues, a gastroenterologist can be reached at any hour by calling [phone].   DIET: Your first meal following the procedure should be a small meal and then it is ok to progress to your normal diet. Heavy or fried foods are harder to digest and may make you feel nauseous or bloated.  Likewise, meals heavy in dairy and vegetables can increase bloating.  Drink plenty of fluids but you should avoid alcoholic beverages for 24  hours.  ACTIVITY:  You should plan to take it easy for the rest of today and you should NOT DRIVE or use heavy machinery until tomorrow (because of the sedation medicines used during the test).    FOLLOW UP: Our staff will call the number listed on your records the next business day following your procedure to check on you and address any questions or concerns that you may have regarding the information given to you following your procedure. If we do not reach you, we will leave a message.  However, if you are feeling well and you are not experiencing any problems, there is no need to return our call.  We will assume that you have returned to your regular daily activities without incident.  If any biopsies were taken you will be contacted by phone or by letter within the next 1-3 weeks.  Please call us at [phone] if you have not heard about the biopsies in 3 weeks.    SIGNATURES/CONFIDENTIALITY: You and/or your care partner have signed paperwork which will be entered into your electronic medical record.  These signatures attest to the fact that that the information above on your After Visit Summary has been reviewed and is understood.  Full responsibility of the confidentiality of this discharge information lies with you and/or your care-partner.   Resume medications. Information given on diverticulosis,hemorrhoids and high fiber diet. 

## 2015-08-19 ENCOUNTER — Telehealth: Payer: Self-pay

## 2015-08-19 NOTE — Telephone Encounter (Signed)
  Follow up Call-  Call back number 08/18/2015  Post procedure Call Back phone  # [phone]  Permission to leave phone message Yes     Patient questions:  Do you have a fever, pain , or abdominal swelling? No. Pain Score  0 *  Have you tolerated food without any problems? Yes.    Have you been able to return to your normal activities? Yes.    Do you have any questions about your discharge instructions: Diet   No. Medications  No. Follow up visit  No.  Do you have questions or concerns about your Care? No.  Actions: * If pain score is 4 or above: No action needed, pain <4.

## 2015-09-13 ENCOUNTER — Ambulatory Visit (HOSPITAL_BASED_OUTPATIENT_CLINIC_OR_DEPARTMENT_OTHER)
Admission: RE | Admit: 2015-09-13 | Discharge: 2015-09-13 | Disposition: A | Discharge status: Home / Self Care | Payer: PPO | Source: Ambulatory Visit | Attending: Emergency Medicine | Admitting: Emergency Medicine

## 2015-09-13 ENCOUNTER — Encounter (HOSPITAL_BASED_OUTPATIENT_CLINIC_OR_DEPARTMENT_OTHER): Payer: Self-pay

## 2015-09-13 ENCOUNTER — Emergency Department (HOSPITAL_BASED_OUTPATIENT_CLINIC_OR_DEPARTMENT_OTHER)
Admission: EM | Admit: 2015-09-13 | Discharge: 2015-09-13 | Disposition: A | Discharge status: Home / Self Care | Payer: PPO | Attending: Emergency Medicine | Admitting: Emergency Medicine

## 2015-09-13 DIAGNOSIS — E785 Hyperlipidemia, unspecified: Secondary | ICD-10-CM | POA: Insufficient documentation

## 2015-09-13 DIAGNOSIS — Z87891 Personal history of nicotine dependence: Secondary | ICD-10-CM | POA: Diagnosis not present

## 2015-09-13 DIAGNOSIS — Z79899 Other long term (current) drug therapy: Secondary | ICD-10-CM | POA: Insufficient documentation

## 2015-09-13 DIAGNOSIS — I10 Essential (primary) hypertension: Secondary | ICD-10-CM | POA: Insufficient documentation

## 2015-09-13 DIAGNOSIS — I8391 Asymptomatic varicose veins of right lower extremity: Secondary | ICD-10-CM

## 2015-09-13 DIAGNOSIS — M7989 Other specified soft tissue disorders: Secondary | ICD-10-CM | POA: Insufficient documentation

## 2015-09-13 DIAGNOSIS — R600 Localized edema: Secondary | ICD-10-CM | POA: Diagnosis not present

## 2015-09-13 DIAGNOSIS — Z7982 Long term (current) use of aspirin: Secondary | ICD-10-CM | POA: Insufficient documentation

## 2015-09-13 NOTE — ED Provider Notes (Signed)
CSN: [id number]     Arrival date & time 09/13/15  [id number] History   First MD Initiated Contact with Patient 09/13/15 [phone]     Chief Complaint  Patient presents with  . Leg Swelling     (Consider location/radiation/quality/duration/timing/severity/associated sxs/prior Treatment) HPI Comments: Patient with past medical history of hypertension presents with complaints of venous swelling in his right lower leg for approximately 2 weeks. Patient also has had generalized swelling of the calf without any pain. No color change of the skin. No recent long immobilizations. No history of blood clots. Patient does exercise 1-2 hours a day. Symptoms are improved when he elevates his legs. No history of heart failure, liver disease, or renal insufficiency. No treatments prior to arrival. Patient is concerned about blood clot in his leg.  The history is provided by the patient.    Past Medical History  Diagnosis Date  . NEOPLASM, MALIGNANT, PROSTATE 08/01/2007  . HYPERLIPIDEMIA 07/28/2006  . Unspecified essential hypertension 03/27/2007  . GERD 07/28/2006  . BENIGN PROSTATIC HYPERTROPHY 07/28/2006  . NISSEN FUNDOPLICATION, HX OF [id number]   Past Surgical History  Procedure Laterality Date  . Tonsillectomy    . Prostatectomy  2009  . Nissen fundoplication  [id number]   Family History  Problem Relation Age of Onset  . Heart disease Mother   . Prostate cancer Father     prostate  . Colon cancer Neg Hx    Social History  Substance Use Topics  . Smoking status: Former Research scientist (life sciences)  . Smokeless tobacco: Never Used     Comment: smoked in college  . Alcohol Use: 0.0 oz/week    0 Standard drinks or equivalent per week     Comment: rarely; once or twice yearly    Review of Systems  Constitutional: Negative for fever.  HENT: Negative for rhinorrhea and sore throat.   Eyes: Negative for redness.  Respiratory: Negative for cough.   Cardiovascular: Positive for leg swelling. Negative for chest pain.   Gastrointestinal: Negative for nausea, vomiting, abdominal pain and diarrhea.  Genitourinary: Negative for dysuria.  Musculoskeletal: Negative for myalgias.  Skin: Negative for rash.  Neurological: Negative for headaches.      Allergies  Review of patient's allergies indicates no known allergies.  Home Medications   Prior to Admission medications   Medication Sig Start Date End Date Taking? Authorizing Provider  ALPRAZolam Duanne Moron) 0.5 MG tablet TAKE 1 TABLET BY MOUTH TWICE DAILY 07/11/15   Marletta Lor, MD  aspirin 81 MG tablet Take 81 mg by mouth daily.      Historical Provider, MD  atorvastatin (LIPITOR) 40 MG tablet Take 1 tablet (40 mg total) by mouth daily. 01/01/15   Marletta Lor, MD  benazepril-hydrochlorthiazide (LOTENSIN HCT) 20-12.5 MG tablet Take 1 tablet by mouth daily. 01/01/15   Marletta Lor, MD   BP 139/93 mmHg  Pulse 65  Temp(Src) 98 F (36.7 C) (Oral)  Resp 18  Ht 6\' 1"  (1.854 m)  Wt 96.616 kg  BMI 28.11 kg/m2  SpO2 100% Physical Exam  Constitutional: He appears well-developed and well-nourished.  HENT:  Head: Normocephalic and atraumatic.  Eyes: Conjunctivae are normal. Right eye exhibits no discharge. Left eye exhibits no discharge.  Neck: Normal range of motion. Neck supple.  Cardiovascular: Normal rate, regular rhythm and normal heart sounds.   Pulmonary/Chest: Effort normal and breath sounds normal.  Abdominal: Soft. There is no tenderness.  Musculoskeletal:  Several varicosities noted to the right lower leg without tenderness or  pain. No pitting edema. No gross swelling of the calf or thigh.  Neurological: He is alert.  Skin: Skin is warm and dry.  Psychiatric: He has a normal mood and affect.  Nursing note and vitals reviewed.   ED Course  Procedures (including critical care time)   10:03 AM Patient seen and examined. [place] ordered to rule out DVT.  Vital signs reviewed and are as follows: BP 139/93 mmHg  Pulse 65   Temp(Src) 98 F (36.7 C) (Oral)  Resp 18  Ht 6\' 1"  (1.854 m)  Wt 96.616 kg  BMI 28.11 kg/m2  SpO2 100%  11:03 AM informed that we do not have ultrasound here today until 2 PM. Patient scheduled to return at 2:30 PM for ultrasound.  Patient updated and agrees with plan. We did discuss conservative management including elevation, compression stockings. Encouraged PCP follow-up if [place] is negative.   MDM   Final diagnoses:  Varicose veins of right lower extremity   Patient with varicose veins of right lower extremity only. Does need rule out for DVT. Patient scheduled for ultrasound at 2:30 today. Symptoms are not severe/suggestive to the point where I feel he needs anticoagulation prior to discharge.   Carlisle Cater, PA-C 09/13/15 [place], MD 09/13/15 2032

## 2015-09-13 NOTE — ED Notes (Signed)
Patient to return to ED for [place] later today. Patient denies any other needs and is A&Ox4 at discharge.

## 2015-09-13 NOTE — ED Notes (Signed)
Patient reports that he has had "buldging calf veins to right leg for several weeks now". Patient reports some tingling and numbness to foot and denies any pain or redness. Patient denies seeing MD for this before. Patient decided to come in today due to the constant swelling of veins and concern for blood clot.

## 2015-09-13 NOTE — Discharge Instructions (Signed)
Please read and follow all provided instructions.  Your diagnoses today include:  1. Varicose veins of right lower extremity    You have been scheduled for an ultrasound in emergency department at 2:30 PM. Please return at that time.  Tests performed today include:  Vital signs. See below for your results today.   Home care instructions:  Follow any educational materials contained in this packet.  Return instructions:   Please return to the Emergency Department if you experience worsening symptoms.   Please return if you have any other emergent concerns.  Additional Information:  Your vital signs today were: BP 139/93 mmHg   Pulse 65   Temp(Src) 98 F (36.7 C) (Oral)   Resp 18   Ht 6\' 1"  (1.854 m)   Wt 96.616 kg   BMI 28.11 kg/m2   SpO2 100% If your blood pressure (BP) was elevated above 135/85 this visit, please have this repeated by your doctor within one month. --------------

## 2015-09-25 ENCOUNTER — Other Ambulatory Visit: Payer: Self-pay | Admitting: Internal Medicine

## 2015-09-25 NOTE — Telephone Encounter (Signed)
May phone in #[phone]- next rx should be through Dr. Raliegh Ip  Also, just wanted to make sure you intended to send this to me instead of Dr. Raliegh Ip

## 2015-09-26 NOTE — Telephone Encounter (Signed)
Rx phoned in under Dr. Ansel Bong name today, Dr. Raliegh Ip is out of the office.

## 2015-10-29 ENCOUNTER — Other Ambulatory Visit: Payer: Self-pay | Admitting: Family Medicine

## 2015-10-30 NOTE — Telephone Encounter (Signed)
Yes thanks, may give #20 to get him to appointment

## 2015-10-31 ENCOUNTER — Telehealth: Payer: Self-pay | Admitting: Internal Medicine

## 2015-10-31 NOTE — Telephone Encounter (Signed)
Pt only received #20 alprazolam ok by dr Retail banker. Pt is aware dr Raliegh Ip is not in office. Pt would like someone to call #40 into pharm on Monday. [address] in [place]

## 2015-11-11 MED ORDER — ALPRAZOLAM 0.5 MG PO TABS: 0.5000 mg | ORAL_TABLET | Freq: Two times a day (BID) | ORAL | 2 refills | Status: DC

## 2015-11-11 NOTE — Telephone Encounter (Signed)
Spoke to pt, told him Rx was called into pharmacy for him # 96 with 2 refills and reminded physical due in October. Pt verbalized understanding.

## 2015-12-23 ENCOUNTER — Other Ambulatory Visit: Payer: Self-pay | Admitting: Internal Medicine

## 2015-12-23 ENCOUNTER — Encounter: Payer: Self-pay | Admitting: Internal Medicine

## 2015-12-23 ENCOUNTER — Ambulatory Visit (INDEPENDENT_AMBULATORY_CARE_PROVIDER_SITE_OTHER): Payer: PPO | Admitting: Internal Medicine

## 2015-12-23 VITALS — BP 128/80 | HR 64 | Temp 97.9°F | Resp 20 | Ht 73.0 in | Wt 218.2 lb

## 2015-12-23 DIAGNOSIS — I10 Essential (primary) hypertension: Secondary | ICD-10-CM | POA: Diagnosis not present

## 2015-12-23 DIAGNOSIS — Z8546 Personal history of malignant neoplasm of prostate: Secondary | ICD-10-CM

## 2015-12-23 DIAGNOSIS — Z0000 Encounter for general adult medical examination without abnormal findings: Secondary | ICD-10-CM | POA: Diagnosis not present

## 2015-12-23 DIAGNOSIS — E785 Hyperlipidemia, unspecified: Secondary | ICD-10-CM

## 2015-12-23 DIAGNOSIS — Z23 Encounter for immunization: Secondary | ICD-10-CM

## 2015-12-23 DIAGNOSIS — C61 Malignant neoplasm of prostate: Secondary | ICD-10-CM

## 2015-12-23 LAB — LIPID PANEL
Cholesterol: 131 mg/dL (ref 0–200)
HDL: 58 mg/dL (ref >39.00)
LDL Cholesterol: 59 mg/dL (ref 0–99)
NonHDL: 72.72
Total CHOL/HDL Ratio: 2
Triglycerides: 69 mg/dL (ref 0.0–149.0)
VLDL: 13.8 mg/dL (ref 0.0–40.0)

## 2015-12-23 LAB — POCT URINALYSIS DIPSTICK
Bilirubin, UA: NEGATIVE
Blood, UA: NEGATIVE
Glucose, UA: NEGATIVE
Ketones, UA: NEGATIVE
Leukocytes, UA: NEGATIVE
Nitrite, UA: NEGATIVE
Spec Grav, UA: 1.015
Urobilinogen, UA: 0.2
pH, UA: 7.5

## 2015-12-23 LAB — COMPREHENSIVE METABOLIC PANEL
ALT: 19 U/L (ref 0–53)
AST: 18 U/L (ref 0–37)
Albumin: 4.5 g/dL (ref 3.5–5.2)
Alkaline Phosphatase: 83 U/L (ref 39–117)
BUN: 15 mg/dL (ref 6–23)
CO2: 29 mEq/L (ref 19–32)
Calcium: 9.5 mg/dL (ref 8.4–10.5)
Chloride: 102 mEq/L (ref 96–112)
Creatinine, Ser: 0.87 mg/dL (ref 0.40–1.50)
GFR: 93.48 mL/min (ref >60.00)
Glucose, Bld: 93 mg/dL (ref 70–99)
Potassium: 4.5 mEq/L (ref 3.5–5.1)
Sodium: 137 mEq/L (ref 135–145)
Total Bilirubin: 0.8 mg/dL (ref 0.2–1.2)
Total Protein: 6.4 g/dL (ref 6.0–8.3)

## 2015-12-23 LAB — CBC WITH DIFFERENTIAL/PLATELET
Basophils Absolute: 0 10*3/uL (ref 0.0–0.1)
Basophils Relative: 0.5 % (ref 0.0–3.0)
Eosinophils Absolute: 0.1 10*3/uL (ref 0.0–0.7)
Eosinophils Relative: 0.9 % (ref 0.0–5.0)
HCT: 44.6 % (ref 39.0–52.0)
Hemoglobin: 14.9 g/dL (ref 13.0–17.0)
Lymphocytes Relative: 23.5 % (ref 12.0–46.0)
Lymphs Abs: 1.9 10*3/uL (ref 0.7–4.0)
MCHC: 33.5 g/dL (ref 30.0–36.0)
MCV: 89.2 fl (ref 78.0–100.0)
Monocytes Absolute: 0.6 10*3/uL (ref 0.1–1.0)
Monocytes Relative: 7.8 % (ref 3.0–12.0)
Neutro Abs: 5.4 10*3/uL (ref 1.4–7.7)
Neutrophils Relative %: 67.3 % (ref 43.0–77.0)
Platelets: 158 10*3/uL (ref 150.0–400.0)
RBC: 5 Mil/uL (ref 4.22–5.81)
RDW: 13.5 % (ref 11.5–15.5)
WBC: 7.9 10*3/uL (ref 4.0–10.5)

## 2015-12-23 LAB — PSA: PSA: 0 ng/mL — ABNORMAL LOW (ref 0.10–4.00)

## 2015-12-23 LAB — TSH: TSH: 1.17 u[IU]/mL (ref 0.35–4.50)

## 2015-12-23 NOTE — Patient Instructions (Signed)
Limit your sodium (Salt) intake    It is important that you exercise regularly, at least 20 minutes 3 to 4 times per week.  If you develop chest pain or shortness of breath seek  medical attention.  You need to lose weight.  Consider a lower calorie diet and regular exercise.  Return in one year for follow-up or as needed

## 2015-12-23 NOTE — Progress Notes (Signed)
Subjective:    Patient ID: Christopher [name], male    DOB: [birth year], 65 y.o.   MRN: [id number]  HPI   Wt Readings from Last 3 Encounters:  12/23/15 218 lb 4 oz (99 kg)  09/13/15 213 lb (96.6 kg)  08/18/15 215 lb (97.5 kg)    Subjective:    Patient ID: Christopher [name], male    DOB: [birth year], 65 y.o.   MRN: [id number]  HPI   Wt Readings from Last 3 Encounters:  12/23/15 218 lb 4 oz (99 kg)  09/13/15 213 lb (96.6 kg)  08/18/15 215 lb (97.5 kg)    Subjective:    Patient ID: Christopher [name], male    DOB: December 29, 1950, 65 y.o.   MRN: [id number]  Pre-visit discussion using our clinic review tool. No additional management support is needed unless otherwise documented below in the visit note.  Wt Readings from Last 3 Encounters:  12/23/15 218 lb 4 oz (99 kg)  09/13/15 213 lb (96.6 kg)  08/18/15 215 lb (97.5 kg)    HPI History of Present Illness:   65  -year-old patient seen today for a wellness exam. Medical problems include hypertension, dyslipidemia, and history of prostate cancer. He is doing quite well. He has been followed by urology in the past but has been discharged  Colonoscopy January 2012 and June 2017.  Preventive Screening-Counseling & Management  Alcohol-Tobacco  Smoking Status: quit   Allergies (verified):  No Known Drug Allergies   Past History:   GERD  Hyperlipidemia  Benign prostatic hypertrophy, history of  Hypertension  prostate cancer   Past Surgical History:   Tonsillectomy  sigmoidoscopy 2004  Colonoscopy 2011 2017  prostatectomy February/2009   Family History:   father died age 33, prostate cancer  mother  died 68 history of chronic atrial fibrillation; CVD  Two brothers in good health; s/p benighn brain tumor   Social History:   Married   Past Medical History:  Diagnosis Date  . BENIGN PROSTATIC HYPERTROPHY 07/28/2006  . GERD 07/28/2006  . HYPERLIPIDEMIA 07/28/2006  . NEOPLASM, MALIGNANT, PROSTATE 08/01/2007  . NISSEN  FUNDOPLICATION, HX OF Q000111Q  . Unspecified essential hypertension 03/27/2007    Social History   Social History  . Marital status: Married    Spouse name: N/A  . Number of children: N/A  . Years of education: N/A   Occupational History  . Not on file.   Social History Main Topics  . Smoking status: Former Research scientist (life sciences)  . Smokeless tobacco: Never Used     Comment: smoked in college  . Alcohol use 0.0 oz/week     Comment: rarely; once or twice yearly  . Drug use: No  . Sexual activity: Not on file   Other Topics Concern  . Not on file   Social History Narrative  . No narrative on file    Past Surgical History:  Procedure Laterality Date  . NISSEN FUNDOPLICATION  99991111  . PROSTATECTOMY  2009  . TONSILLECTOMY      Family History  Problem Relation Age of Onset  . Heart disease Mother   . Prostate cancer Father     prostate  . Colon cancer Neg Hx     No Known Allergies  Current Outpatient Prescriptions on File Prior to Visit  Medication Sig Dispense Refill  . ALPRAZolam (XANAX) 0.5 MG tablet Take 1 tablet (0.5 mg total) by mouth 2 (two) times daily. 60 tablet 2  . aspirin 81 MG tablet Take 81  mg by mouth daily.      Marland Kitchen atorvastatin (LIPITOR) 40 MG tablet Take 1 tablet (40 mg total) by mouth daily. 90 tablet 3  . benazepril-hydrochlorthiazide (LOTENSIN HCT) 20-12.5 MG tablet Take 1 tablet by mouth daily. 90 tablet 3   No current facility-administered medications on file prior to visit.     BP 128/80 (BP Location: Left Arm, Patient Position: Sitting, Cuff Size: Normal)   Pulse 64   Temp 97.9 F (36.6 C) (Oral)   Resp 20   Ht 6\' 1"  (1.854 m)   Wt 218 lb 4 oz (99 kg)   SpO2 99%   BMI 28.79 kg/m    Medicare wellness exam    1. Risk factors, based on past  M,S,F history.  Current vascular risk factors include hypertension and dyslipidemia  2.  Physical activities: Remains very active at his health club several times per week  3.  Depression/mood: No  history of major depression or mood disorder  4.  Hearing: No deficits   5.  ADL's: Independent in all aspects of daily living  6.  Fall risk: Low   7.  Home safety: No problems identified  8.  Height weight, and visual acuity; height and weight stable no change in visual acuity  9.  Counseling: Heart healthy diet regular exercise modest weight loss.  All encouraged  10. Lab orders based on risk factors: Laboratory profile be reviewed including lipid profile and PSA  11. Referral : Not appropriate at this time  12. Care plan: Continue efforts at aggressive risk factor modification  13. Cognitive assessment: Alert in order with normal affect.  No cognitive dysfunction  14. Screening: Patient provided with a written and personalized 5-10 year screening schedule in the AVS.    15. Provider List Update:  Primary care GI  Review of Systems  Constitutional: Negative for fever, chills, activity change, appetite change and fatigue.  HENT: Negative for hearing loss, ear pain, congestion, rhinorrhea, sneezing, mouth sores, trouble swallowing, neck pain, neck stiffness, dental problem, voice change, sinus pressure and tinnitus.   Eyes: Negative for photophobia, pain, redness and visual disturbance.  Respiratory: Negative for apnea, cough, choking, chest tightness, shortness of breath and wheezing.   Cardiovascular: Negative for chest pain, palpitations and leg swelling.  Gastrointestinal: Negative for nausea, vomiting, abdominal pain, diarrhea, constipation, blood in stool, abdominal distention, anal bleeding and rectal pain.  Genitourinary: Negative for dysuria, urgency, frequency, hematuria, flank pain, decreased urine volume, discharge, penile swelling, scrotal swelling, difficulty urinating, genital sores and testicular pain.  Musculoskeletal: Negative for myalgias, back pain, joint swelling, arthralgias and gait problem.  Skin: Negative for color change, rash and wound.  Neurological:  Negative for dizziness, tremors, seizures, syncope, facial asymmetry, speech difficulty, weakness, light-headedness, numbness and headaches.  Hematological: Negative for adenopathy. Does not bruise/bleed easily.  Psychiatric/Behavioral: Negative for suicidal ideas, hallucinations, behavioral problems, confusion, sleep disturbance, self-injury, dysphoric mood, decreased concentration and agitation. The patient is not nervous/anxious.        Objective:   Physical Exam  Constitutional: He appears well-developed and well-nourished.  HENT:  Head: Normocephalic and atraumatic.  Right Ear: External ear normal.  Left Ear: External ear normal.  Nose: Nose normal.  Mouth/Throat: Oropharynx is clear and moist.  Eyes: Conjunctivae and EOM are normal. Pupils are equal, round, and reactive to light. No scleral icterus.  Neck: Normal range of motion. Neck supple. No JVD present. No thyromegaly present.  Cardiovascular: Regular rhythm, normal heart sounds and intact distal  pulses.  Exam reveals no gallop and no friction rub.   No murmur heard. Pulmonary/Chest: Effort normal and breath sounds normal. He exhibits no tenderness.  Abdominal: Soft. Bowel sounds are normal. He exhibits no distension and no mass. There is no tenderness.  Genitourinary: Penis normal.  Musculoskeletal: Normal range of motion. He exhibits no edema and no tenderness.  Lymphadenopathy:    He has no cervical adenopathy.  Neurological: He is alert. He has normal reflexes. No cranial nerve deficit. Coordination normal.  Skin: Skin is warm and dry. No rash noted.  Psychiatric: He has a normal mood and affect. His behavior is normal.          Assessment & Plan:   Preventive health examination Hypertension stable Dyslipidemia stable History prostate cancer status post prostatectomy.  We'll continue present regimen his lipid profile is under very nice control we'll continue  efforts at lifestyle. Exercise weight loss all  encouraged we'll recheck in one year  Review of Systems  Constitutional: Negative for fever, chills, activity change, appetite change and fatigue.  HENT: Negative for congestion, dental problem, ear pain, hearing loss, mouth sores, rhinorrhea, sinus pressure, sneezing, tinnitus, trouble swallowing and voice change.   Eyes: Negative for photophobia, pain, redness and visual disturbance.  Respiratory: Negative for apnea, cough, choking, chest tightness, shortness of breath and wheezing.   Cardiovascular: Negative for chest pain, palpitations and leg swelling.  Gastrointestinal: Negative for nausea, vomiting, abdominal pain, diarrhea, constipation, blood in stool, abdominal distention, anal bleeding and rectal pain.  Genitourinary: Negative for dysuria, urgency, frequency, hematuria, flank pain, decreased urine volume, discharge, penile swelling, scrotal swelling, difficulty urinating, genital sores and testicular pain.  Musculoskeletal: Negative for arthralgias, back pain, gait problem, joint swelling, myalgias, neck pain and neck stiffness.  Skin: Negative for color change, rash and wound.  Neurological: Negative for dizziness, tremors, seizures, syncope, facial asymmetry, speech difficulty, weakness, light-headedness, numbness and headaches.  Hematological: Negative for adenopathy. Does not bruise/bleed easily.  Psychiatric/Behavioral: Negative for suicidal ideas, hallucinations, behavioral problems, confusion, sleep disturbance, self-injury, dysphoric mood, decreased concentration and agitation. The patient is not nervous/anxious.        Objective:   Physical Exam  Constitutional: He appears well-developed and well-nourished.  HENT:  Head: Normocephalic and atraumatic.  Right Ear: External ear normal.  Left Ear: External ear normal.  Nose: Nose normal.  Mouth/Throat: Oropharynx is clear and moist.  Eyes: Conjunctivae and EOM are normal. Pupils are equal, round, and reactive to light. No  scleral icterus.  Neck: Normal range of motion. Neck supple. No JVD present. No thyromegaly present.  Cardiovascular: Regular rhythm, normal heart sounds and intact distal pulses.  Exam reveals no gallop and no friction rub.   No murmur heard. Pulmonary/Chest: Effort normal and breath sounds normal. He exhibits no tenderness.  Abdominal: Soft. Bowel sounds are normal. He exhibits no distension and no mass. There is no tenderness.  Genitourinary: Penis normal.  Musculoskeletal: Normal range of motion. He exhibits no edema and no tenderness.  Lymphadenopathy:    He has no cervical adenopathy.  Neurological: He is alert. He has normal reflexes. No cranial nerve deficit. Coordination normal.  Skin: Skin is warm and dry. No rash noted.  Psychiatric: He has a normal mood and affect. His behavior is normal.          Assessment & Plan:   Preventive health exam Exogenous obesity. Continue efforts at modest weight loss Hypertension well controlled Dyslipidemia well controlled  Continue present efforts at additional  weight loss. Goal is less than 200 pounds. Recheck in one year;  medications refilled We'll check updated lab    Review of Systems     as above Objective:   Physical Exam   As above      Assessment & Plan:  As above Return in one year for follow-up Prevnar 13 administered  Christopher [name] Christopher [name]

## 2015-12-23 NOTE — Progress Notes (Signed)
Pre visit review using our clinic review tool, if applicable. No additional management support is needed unless otherwise documented below in the visit note. 

## 2016-02-22 ENCOUNTER — Other Ambulatory Visit: Payer: Self-pay | Admitting: Internal Medicine

## 2016-02-23 ENCOUNTER — Telehealth: Payer: Self-pay | Admitting: Internal Medicine

## 2016-02-23 NOTE — Telephone Encounter (Signed)
Pt received a call from the pharmacy ([facility]) and wanted to know why his alprazolam was denied.

## 2016-02-23 NOTE — Telephone Encounter (Signed)
Okay to refill? 

## 2016-02-23 NOTE — Telephone Encounter (Signed)
Pt requesting Alprazolam 0.5mg  tablet refill,  last filled 11/11/15 #60 with 2 refills. Last office visit 12/23/15.

## 2016-02-24 MED ORDER — ALPRAZOLAM 0.5 MG PO TABS: 0.5000 mg | ORAL_TABLET | Freq: Two times a day (BID) | ORAL | 2 refills | Status: DC

## 2016-02-24 NOTE — Telephone Encounter (Signed)
Spoke to pt, told him Rx was not denied. Rx was called into pharmacy. Pt verbalized understanding.

## 2016-02-24 NOTE — Telephone Encounter (Signed)
Rx refilled 02/24/16.

## 2016-03-24 NOTE — Progress Notes (Deleted)
[name] [address], [place] [address] Phone: [phone] Subjective:    I'm seeing this patient by the request  of:  Nyoka Cowden, MD   CC: Bilateral knee pain   [id number]  Christopher [name] is a 66 y.o. male coming in with complaint of bilateral knee pain.     Past Medical History:  Diagnosis Date  . BENIGN PROSTATIC HYPERTROPHY 07/28/2006  . GERD 07/28/2006  . HYPERLIPIDEMIA 07/28/2006  . NEOPLASM, MALIGNANT, PROSTATE 08/01/2007  . NISSEN FUNDOPLICATION, HX OF Q000111Q  . Unspecified essential hypertension 03/27/2007   Past Surgical History:  Procedure Laterality Date  . NISSEN FUNDOPLICATION  99991111  . PROSTATECTOMY  2009  . TONSILLECTOMY     Social History   Social History  . Marital status: Married    Spouse name: N/A  . Number of children: N/A  . Years of education: N/A   Social History Main Topics  . Smoking status: Former Research scientist (life sciences)  . Smokeless tobacco: Never Used     Comment: smoked in college  . Alcohol use 0.0 oz/week     Comment: rarely; once or twice yearly  . Drug use: No  . Sexual activity: Not on file   Other Topics Concern  . Not on file   Social History Narrative  . No narrative on file   No Known Allergies Family History  Problem Relation Age of Onset  . Heart disease Mother   . Prostate cancer Father     prostate  . Colon cancer Neg Hx     Past medical history, social, surgical and family history all reviewed in electronic medical record.  No pertanent information unless stated regarding to the chief complaint.   Review of Systems:Review of systems updated and as accurate as of 03/24/16  No headache, visual changes, nausea, vomiting, diarrhea, constipation, dizziness, abdominal pain, skin rash, fevers, chills, night sweats, weight loss, swollen lymph nodes, body aches, joint swelling, muscle aches, chest pain, shortness of breath, mood changes.   Objective  There were no vitals  taken for this visit. Systems examined below as of 03/24/16   General: No apparent distress alert and oriented x3 mood and affect normal, dressed appropriately.  HEENT: Pupils equal, extraocular movements intact  Respiratory: Patient's speak in full sentences and does not appear short of breath  Cardiovascular: No lower extremity edema, non tender, no erythema  Skin: Warm dry intact with no signs of infection or rash on extremities or on axial skeleton.  Abdomen: Soft nontender  Neuro: Cranial nerves II through XII are intact, neurovascularly intact in all extremities with 2+ DTRs and 2+ pulses.  Lymph: No lymphadenopathy of posterior or anterior cervical chain or axillae bilaterally.  Gait normal with good balance and coordination.  MSK:  Non tender with full range of motion and good stability and symmetric strength and tone of shoulders, elbows, wrist, hip, and ankles bilaterally.  Knee: Normal to inspection with no erythema or effusion or obvious bony abnormalities. Palpation normal with no warmth, joint line tenderness, patellar tenderness, or condyle tenderness. ROM full in flexion and extension and lower leg rotation. Ligaments with solid consistent endpoints including ACL, PCL, LCL, MCL. Negative Mcmurray's, Apley's, and Thessalonian tests. Non painful patellar compression. Patellar glide without crepitus. Patellar and quadriceps tendons unremarkable. Hamstring and quadriceps strength is normal.     Impression and Recommendations:     This case required medical decision making of moderate complexity.      Note:  This dictation was prepared with Dragon dictation along with smaller phrase technology. Any transcriptional errors that result from this process are unintentional.

## 2016-03-25 ENCOUNTER — Ambulatory Visit: Payer: PPO | Admitting: Family Medicine

## 2016-04-06 NOTE — Progress Notes (Signed)
[name] [address] [place], [place] [address] Phone: [phone] Subjective:    I'm seeing this patient by the request  of:  Nyoka Cowden, MD   CC: Bilateral knee pain   [id number]  Christopher [name]. is a 66 y.o. male coming in with complaint of bilateral knee pain. Patient has had this pain for quite some time. Seems to be worsening over the course last several months. Patient has started doing more exercises than he used to. Recently retired. Patient did do a sedentary job. Pain seems to be on the anterior aspect of the knees. Seems worse with activity. Better with rest. Patient denies any swelling. Denies any radiation or numbness. States that sometimes left knee seems to give him some feelings of instability.     Past Medical History:  Diagnosis Date  . BENIGN PROSTATIC HYPERTROPHY 07/28/2006  . GERD 07/28/2006  . HYPERLIPIDEMIA 07/28/2006  . NEOPLASM, MALIGNANT, PROSTATE 08/01/2007  . NISSEN FUNDOPLICATION, HX OF Q000111Q  . Unspecified essential hypertension 03/27/2007   Past Surgical History:  Procedure Laterality Date  . NISSEN FUNDOPLICATION  99991111  . PROSTATECTOMY  2009  . TONSILLECTOMY     Social History   Social History  . Marital status: Married    Spouse name: N/A  . Number of children: N/A  . Years of education: N/A   Social History Main Topics  . Smoking status: Former Research scientist (life sciences)  . Smokeless tobacco: Never Used     Comment: smoked in college  . Alcohol use 0.0 oz/week     Comment: rarely; once or twice yearly  . Drug use: No  . Sexual activity: Not Asked   Other Topics Concern  . None   Social History Narrative  . None   No Known Allergies Family History  Problem Relation Age of Onset  . Heart disease Mother   . Prostate cancer Father     prostate  . Colon cancer Neg Hx     Past medical history, social, surgical and family history all reviewed in electronic medical record.  No pertanent  information unless stated regarding to the chief complaint.   Review of Systems:Review of systems updated and as accurate as of 04/07/16  No headache, visual changes, nausea, vomiting, diarrhea, constipation, dizziness, abdominal pain, skin rash, fevers, chills, night sweats, weight loss, swollen lymph nodes, body aches, joint swelling, muscle aches, chest pain, shortness of breath, mood changes.   Objective  Blood pressure 128/80, pulse 67, height 6\' 1"  (1.854 m), weight 219 lb (99.3 kg), SpO2 98 %. Systems examined below as of 04/07/16   General: No apparent distress alert and oriented x3 mood and affect normal, dressed appropriately.  HEENT: Pupils equal, extraocular movements intact  Respiratory: Patient's speak in full sentences and does not appear short of breath  Cardiovascular: No lower extremity edema, non tender, no erythema  Skin: Warm dry intact with no signs of infection or rash on extremities or on axial skeleton.  Abdomen: Soft nontender  Neuro: Cranial nerves II through XII are intact, neurovascularly intact in all extremities with 2+ DTRs and 2+ pulses.  Lymph: No lymphadenopathy of posterior or anterior cervical chain or axillae bilaterally.  Gait normal with good balance and coordination.  MSK:  Non tender with full range of motion and good stability and symmetric strength and tone of shoulders, elbows, wrist, hip, and ankles bilaterally.  Knee:Bilateral Lateral tilt of the knee Bilaterally. Palpation normal with no warmth, joint line tenderness,  patellar tenderness, or condyle tenderness. ROM full in flexion and extension and lower leg rotation. Ligaments with solid consistent endpoints including ACL, PCL, LCL, MCL. Negative Mcmurray's, Apley's, and Thessalonian tests. painful patellar compression. Patellar glide with moderate crepitus. Patellar and quadriceps tendons unremarkable. Hamstring and quadriceps strength is normal.    Procedure note [id number]; 15 minutes  spent for Therapeutic exercises as stated in above notes.  This included exercises focusing on stretching, strengthening, with significant focus on eccentric aspects.  Dissection extension exercises working on hip abductor strengthening in vastus medialis oblique strengthening. Discussed hamstring eccentric's. Proper technique shown and discussed handout in great detail with ATC.  All questions were discussed and answered.     Impression and Recommendations:     This case required medical decision making of moderate complexity.      Note: This dictation was prepared with Dragon dictation along with smaller phrase technology. Any transcriptional errors that result from this process are unintentional.

## 2016-04-07 ENCOUNTER — Ambulatory Visit (INDEPENDENT_AMBULATORY_CARE_PROVIDER_SITE_OTHER)
Admission: RE | Admit: 2016-04-07 | Discharge: 2016-04-07 | Disposition: A | Discharge status: Home / Self Care | Payer: PPO | Source: Ambulatory Visit | Attending: Family Medicine | Admitting: Family Medicine

## 2016-04-07 ENCOUNTER — Ambulatory Visit (INDEPENDENT_AMBULATORY_CARE_PROVIDER_SITE_OTHER): Payer: PPO | Admitting: Family Medicine

## 2016-04-07 ENCOUNTER — Encounter: Payer: Self-pay | Admitting: Family Medicine

## 2016-04-07 DIAGNOSIS — M222X2 Patellofemoral disorders, left knee: Secondary | ICD-10-CM

## 2016-04-07 DIAGNOSIS — M222X1 Patellofemoral disorders, right knee: Secondary | ICD-10-CM

## 2016-04-07 DIAGNOSIS — M170 Bilateral primary osteoarthritis of knee: Secondary | ICD-10-CM | POA: Diagnosis not present

## 2016-04-07 NOTE — Patient Instructions (Signed)
Good to see you.  Ice 20 minutes 2 times daily. Usually after activity and before bed. Exercises 3 times a week.  pennsaid pinkie amount topically 2 times daily as needed.  Vitamin D 2000 IU daily  Turmeric 500mg  twice daily  Biking and elliptical instead of treadmill.  See me again in 4 weeks.

## 2016-04-07 NOTE — Assessment & Plan Note (Signed)
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 Mild lateral tracking of the knee Bilaterally. No be surprised patient has any significant arthritic changes. X-rays ordered today. We discussed icing regimen. Patient was a topical anti-inflammatory's. Icing regimen. Follow-up in 4 weeks. Worsening symptoms consider injection.

## 2016-04-21 ENCOUNTER — Other Ambulatory Visit: Payer: Self-pay | Admitting: Internal Medicine

## 2016-04-28 ENCOUNTER — Encounter: Payer: Self-pay | Admitting: Family Medicine

## 2016-04-28 ENCOUNTER — Ambulatory Visit (INDEPENDENT_AMBULATORY_CARE_PROVIDER_SITE_OTHER): Payer: PPO | Admitting: Family Medicine

## 2016-04-28 DIAGNOSIS — M222X2 Patellofemoral disorders, left knee: Secondary | ICD-10-CM

## 2016-04-28 DIAGNOSIS — M222X1 Patellofemoral disorders, right knee: Secondary | ICD-10-CM

## 2016-04-28 NOTE — Patient Instructions (Signed)
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  Stay active Exercises on wall.  Heel and butt touching.  Raise leg 6 inches and hold 2 seconds.  Down slow for count of 4 seconds.  1 set of 30 reps daily on both sides.  See em again in 2 months to make sure you are doing well.

## 2016-04-28 NOTE — Progress Notes (Signed)
[name] [address] [place], [place] [address] Phone: [phone] Subjective:    I'm seeing this patient by the request  of:  Nyoka Cowden, MD   CC: Bilateral knee pain f/u  [id number]  Christopher [name]. is a 66 y.o. male coming in with complaint of bilateral knee pain. 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 Mild arthritis noted. Patient was to start doing home exercises. States he's been doing any over-the-counter medications were suggested. States that he is feeling approximately 80% better. States that the swelling went down after couple days on the turmeric. Patient is been doing the icing and the exercises religiously. Fairly happy with the results of far.  Patient did have bilateral standing x-rays showing the patient did have mild to moderate arthritic changes of the medial and patellofemoral compartment. This was independently visualized by me.     Past Medical History:  Diagnosis Date  . BENIGN PROSTATIC HYPERTROPHY 07/28/2006  . GERD 07/28/2006  . HYPERLIPIDEMIA 07/28/2006  . NEOPLASM, MALIGNANT, PROSTATE 08/01/2007  . NISSEN FUNDOPLICATION, HX OF [id number]  . Unspecified essential hypertension 03/27/2007   Past Surgical History:  Procedure Laterality Date  . NISSEN FUNDOPLICATION  [id number]  . PROSTATECTOMY  2009  . TONSILLECTOMY     Social History   Social History  . Marital status: Married    Spouse name: N/A  . Number of children: N/A  . Years of education: N/A   Social History Main Topics  . Smoking status: Former Research scientist (life sciences)  . Smokeless tobacco: Never Used     Comment: smoked in college  . Alcohol use 0.0 oz/week     Comment: rarely; once or twice yearly  . Drug use: No  . Sexual activity: Not Asked   Other Topics Concern  . None   Social History Narrative  . None   No Known Allergies Family History  Problem Relation Age of Onset  . Heart disease Mother   . Prostate cancer Father     prostate    . Colon cancer Neg Hx     Past medical history, social, surgical and family history all reviewed in electronic medical record.  No pertanent information unless stated regarding to the chief complaint.   Review of Systems: No headache, visual changes, nausea, vomiting, diarrhea, constipation, dizziness, abdominal pain, skin rash, fevers, chills, night sweats, weight loss, swollen lymph nodes, body aches, joint swelling, muscle aches, chest pain, shortness of breath, mood changes.    Objective  Blood pressure 118/78, pulse 68, height 6\' 1"  (1.854 m), weight 221 lb (100.2 kg), SpO2 100 %.   Systems examined below as of 04/28/16 General: NAD A&O x3 mood, affect normal  HEENT: Pupils equal, extraocular movements intact no nystagmus Respiratory: not short of breath at rest or with speaking Cardiovascular: No lower extremity edema, non tender Skin: Warm dry intact with no signs of infection or rash on extremities or on axial skeleton. Abdomen: Soft nontender, no masses Neuro: Cranial nerves  intact, neurovascularly intact in all extremities with 2+ DTRs and 2+ pulses. Lymph: No lymphadenopathy appreciated today  Gait normal with good balance and coordination.  MSK: Non tender with full range of motion and good stability and symmetric strength and tone of shoulders, elbows, wrist,  hips and ankles bilaterally.   Knee:Bilateral Lateral tilt of the knee Bilaterally. Nontender on exam today. ROM full in flexion and extension and lower leg rotation. Ligaments with solid consistent endpoints including ACL, PCL, LCL,  MCL. Negative Mcmurray's, Apley's, and Thessalonian tests. Non-painful patellar compression. Patellar glide with mild crepitus. Patellar and quadriceps tendons unremarkable. Hamstring and quadriceps strength is normal.       Impression and Recommendations:     This case required medical decision making of moderate complexity.      Note: This dictation was prepared with  Dragon dictation along with smaller phrase technology. Any transcriptional errors that result from this process are unintentional.

## 2016-04-28 NOTE — Assessment & Plan Note (Signed)
Patient doesn't more of a patellofemoral arthritis than true syndrome. Discussed with patient as well as his been doing well with continue with conservative therapy. No significant changes. Follow-up again in 2 months. Worsening symptoms consider injections.

## 2016-06-08 ENCOUNTER — Other Ambulatory Visit: Payer: Self-pay | Admitting: Internal Medicine

## 2016-06-14 ENCOUNTER — Other Ambulatory Visit: Payer: Self-pay | Admitting: Internal Medicine

## 2016-07-07 ENCOUNTER — Ambulatory Visit: Payer: PPO | Admitting: Family Medicine

## 2016-07-22 ENCOUNTER — Ambulatory Visit (INDEPENDENT_AMBULATORY_CARE_PROVIDER_SITE_OTHER): Payer: PPO | Admitting: Family Medicine

## 2016-07-22 ENCOUNTER — Encounter: Payer: Self-pay | Admitting: Family Medicine

## 2016-07-22 DIAGNOSIS — M222X1 Patellofemoral disorders, right knee: Secondary | ICD-10-CM | POA: Diagnosis not present

## 2016-07-22 DIAGNOSIS — M222X2 Patellofemoral disorders, left knee: Secondary | ICD-10-CM

## 2016-07-22 NOTE — Patient Instructions (Signed)
Good to see you  Ice 20 minutes 2 times daily. Usually after activity and before bed. Exercises 3 times a week.  Injected both knees today  See me again in 4 weeks and if worsening we may try other injections.   Otherwise we can repeat these injections every 10 weeks or as needed

## 2016-07-22 NOTE — Assessment & Plan Note (Signed)
Worsening symptoms. Bilateral injections given today. Tolerated the procedure well. We discussed continuing the home exercises and icing protocol. Patient has made some improvement with vastus medialis was strength. We discussed icing regimen. Patient will follow-up with me In 4-6 weeks. Could be a candidate for viscous supplementation.

## 2016-07-22 NOTE — Progress Notes (Signed)
[name] [address], [place] [address] Phone: [phone] Subjective:    I'm seeing this patient by the request  of:  Marletta Lor, MD   CC: Bilateral knee pain f/u  [id number]  Christopher [name]. is a 66 y.o. male coming in with complaint of bilateral knee pain. 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 Mild arthritis noted. Patient is having significantly increasing discomfort recently. Patient has been working on a regular basis but now is unable to secondary to the pain. Some mild increase in instability of the anterior aspect of the knee. Not as severe as before the initial treatments but still worsening since previous exam.  Patient did have bilateral standing x-rays showing the patient did have mild to moderate arthritic changes of the medial and patellofemoral compartment. This was independently visualized by me.     Past Medical History:  Diagnosis Date  . BENIGN PROSTATIC HYPERTROPHY 07/28/2006  . GERD 07/28/2006  . HYPERLIPIDEMIA 07/28/2006  . NEOPLASM, MALIGNANT, PROSTATE 08/01/2007  . NISSEN FUNDOPLICATION, HX OF 6/71/2458  . Unspecified essential hypertension 03/27/2007   Past Surgical History:  Procedure Laterality Date  . NISSEN FUNDOPLICATION  0998  . PROSTATECTOMY  2009  . TONSILLECTOMY     Social History   Social History  . Marital status: Married    Spouse name: N/A  . Number of children: N/A  . Years of education: N/A   Social History Main Topics  . Smoking status: Former Research scientist (life sciences)  . Smokeless tobacco: Never Used     Comment: smoked in college  . Alcohol use 0.0 oz/week     Comment: rarely; once or twice yearly  . Drug use: No  . Sexual activity: Not Asked   Other Topics Concern  . None   Social History Narrative  . None   No Known Allergies Family History  Problem Relation Age of Onset  . Heart disease Mother   . Prostate cancer Father        prostate  . Colon cancer Neg Hx      Past medical history, social, surgical and family history all reviewed in electronic medical record.  No pertanent information unless stated regarding to the chief complaint.   Review of Systems: No headache, visual changes, nausea, vomiting, diarrhea, constipation, dizziness, abdominal pain, skin rash, fevers, chills, night sweats, weight loss, swollen lymph nodes, body aches, joint swelling, muscle aches, chest pain, shortness of breath, mood changes.    Objective  Blood pressure 140/80, pulse 79, height 6\' 1"  (1.854 m), weight 223 lb (101.2 kg), SpO2 99 %.   Systems examined below as of 07/22/16 General: NAD A&O x3 mood, affect normal  HEENT: Pupils equal, extraocular movements intact no nystagmus Respiratory: not short of breath at rest or with speaking Cardiovascular: No lower extremity edema, non tender Skin: Warm dry intact with no signs of infection or rash on extremities or on axial skeleton. Abdomen: Soft nontender, no masses Neuro: Cranial nerves  intact, neurovascularly intact in all extremities with 2+ DTRs and 2+ pulses. Lymph: No lymphadenopathy appreciated today  Gait normal with good balance and coordination.  MSK: Non tender with full range of motion and good stability and symmetric strength and tone of shoulders, elbows, wrist, hips and ankles bilaterally.   Knee: Bilateral valgus deformity noted. Mild lateral tilt Tender to palpation over  PF joint line.  ROM full in flexion and extension and lower leg rotation. instability with valgus force.  painful patellar compression. Patellar glide with moderate crepitus. Patellar and quadriceps tendons unremarkable. Hamstring and quadriceps strength is normal.       Impression and Recommendations:     This case required medical decision making of moderate complexity.      Note: This dictation was prepared with Dragon dictation along with smaller phrase technology. Any transcriptional errors that result from  this process are unintentional.

## 2016-08-14 ENCOUNTER — Other Ambulatory Visit: Payer: Self-pay | Admitting: Internal Medicine

## 2016-08-15 ENCOUNTER — Other Ambulatory Visit: Payer: Self-pay | Admitting: Internal Medicine

## 2016-08-19 ENCOUNTER — Encounter: Payer: Self-pay | Admitting: Family Medicine

## 2016-08-19 ENCOUNTER — Ambulatory Visit (INDEPENDENT_AMBULATORY_CARE_PROVIDER_SITE_OTHER): Payer: PPO | Admitting: Family Medicine

## 2016-08-19 DIAGNOSIS — M222X1 Patellofemoral disorders, right knee: Secondary | ICD-10-CM

## 2016-08-19 DIAGNOSIS — M222X2 Patellofemoral disorders, left knee: Secondary | ICD-10-CM

## 2016-08-19 NOTE — Assessment & Plan Note (Signed)
Responded to injections, may need it again Continue conservative therapy  F/u in 6 weeks

## 2016-08-19 NOTE — Patient Instructions (Signed)
Good to see you  Ice is your friend Continue the exercises 2 times a week  Stay active See me again in 6 weeks.

## 2016-08-19 NOTE — Progress Notes (Signed)
[name] [address] [place], [place] [address] Phone: [phone] Subjective:    I'm seeing this patient by the request  of:  Marletta Lor, MD   CC: Bilateral knee pain f/u  [id number]  Christopher [name]. is a 66 y.o. male coming in with complaint of bilateral knee pain. 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 Mild arthritis noted. Patient has follow-up does not make any significant improvement and was given an injection. Since then he is stating that he is 85% better. Patient is able to work on a more regular basis. No swelling. No numbness.  Patient did have bilateral standing x-rays showing the patient did have mild to moderate arthritic changes of the medial and patellofemoral compartment. This was independently visualized by me.     Past Medical History:  Diagnosis Date  . BENIGN PROSTATIC HYPERTROPHY 07/28/2006  . GERD 07/28/2006  . HYPERLIPIDEMIA 07/28/2006  . NEOPLASM, MALIGNANT, PROSTATE 08/01/2007  . NISSEN FUNDOPLICATION, HX OF 1/60/7371  . Unspecified essential hypertension 03/27/2007   Past Surgical History:  Procedure Laterality Date  . NISSEN FUNDOPLICATION  0626  . PROSTATECTOMY  2009  . TONSILLECTOMY     Social History   Social History  . Marital status: Married    Spouse name: N/A  . Number of children: N/A  . Years of education: N/A   Social History Main Topics  . Smoking status: Former Research scientist (life sciences)  . Smokeless tobacco: Never Used     Comment: smoked in college  . Alcohol use 0.0 oz/week     Comment: rarely; once or twice yearly  . Drug use: No  . Sexual activity: Not Asked   Other Topics Concern  . None   Social History Narrative  . None   No Known Allergies Family History  Problem Relation Age of Onset  . Heart disease Mother   . Prostate cancer Father        prostate  . Colon cancer Neg Hx     Past medical history, social, surgical and family history all reviewed in electronic medical  record.  No pertanent information unless stated regarding to the chief complaint.   Review of Systems: No headache, visual changes, nausea, vomiting, diarrhea, constipation, dizziness, abdominal pain, skin rash, fevers, chills, night sweats, weight loss, swollen lymph nodes, body aches, joint swelling, muscle aches, chest pain, shortness of breath, mood changes.     Objective  Blood pressure 110/74, pulse 70, height 6\' 1"  (1.854 m), weight 216 lb (98 kg).   Systems examined below as of 08/19/16 General: NAD A&O x3 mood, affect normal  HEENT: Pupils equal, extraocular movements intact no nystagmus Respiratory: not short of breath at rest or with speaking Cardiovascular: No lower extremity edema, non tender Skin: Warm dry intact with no signs of infection or rash on extremities or on axial skeleton. Abdomen: Soft nontender, no masses Neuro: Cranial nerves  intact, neurovascularly intact in all extremities with 2+ DTRs and 2+ pulses. Lymph: No lymphadenopathy appreciated today  Gait normal with good balance and coordination.  MSK: Non tender with full range of motion and good stability and symmetric strength and tone of shoulders, elbows, wrist, hips and ankles bilaterally.   Knee: Bilateral  Moderate osteophytic changes Palpation normal with no warmth, joint line tenderness, patellar tenderness, or condyle tenderness. ROM full in flexion and extension and lower leg rotation. Ligaments with solid consistent endpoints including ACL, PCL, LCL, MCL. Negative Mcmurray's, Apley's, and Thessalonian tests.  Mild painful patellar compression. Patellar glide without crepitus. Patellar and quadriceps tendons unremarkable. Hamstring and quadriceps strength is normal.        Impression and Recommendations:     This case required medical decision making of moderate complexity.      Note: This dictation was prepared with Dragon dictation along with smaller phrase technology. Any  transcriptional errors that result from this process are unintentional.

## 2016-09-21 ENCOUNTER — Other Ambulatory Visit: Payer: Self-pay | Admitting: Internal Medicine

## 2016-09-30 ENCOUNTER — Encounter: Payer: Self-pay | Admitting: Family Medicine

## 2016-09-30 ENCOUNTER — Ambulatory Visit (INDEPENDENT_AMBULATORY_CARE_PROVIDER_SITE_OTHER): Payer: PPO | Admitting: Family Medicine

## 2016-09-30 DIAGNOSIS — M222X1 Patellofemoral disorders, right knee: Secondary | ICD-10-CM

## 2016-09-30 DIAGNOSIS — M222X2 Patellofemoral disorders, left knee: Secondary | ICD-10-CM

## 2016-09-30 NOTE — Assessment & Plan Note (Signed)
Patient is still doing well after the bilateral injections. Continue conservative therapy. Follow-up as needed

## 2016-09-30 NOTE — Progress Notes (Signed)
[name] [address], [place] [address] Phone: [phone] Subjective:    I'm seeing this patient by the request  of:  Marletta Lor, MD   CC: Bilateral knee pain f/u  [id number]  Christopher [name]. is a 66 y.o. male coming in with complaint of bilateral knee pain. 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 Mild arthritis noted. Patient has follow-up does not make any significant improvement and was given an injection. Then 10 weeks ago. Patient is doing relatively well. Patient states still seems to be about 85% better. Working out most days of the week. No new symptoms.     Past Medical History:  Diagnosis Date  . BENIGN PROSTATIC HYPERTROPHY 07/28/2006  . GERD 07/28/2006  . HYPERLIPIDEMIA 07/28/2006  . NEOPLASM, MALIGNANT, PROSTATE 08/01/2007  . NISSEN FUNDOPLICATION, HX OF 2/67/1245  . Unspecified essential hypertension 03/27/2007   Past Surgical History:  Procedure Laterality Date  . NISSEN FUNDOPLICATION  8099  . PROSTATECTOMY  2009  . TONSILLECTOMY     Social History   Social History  . Marital status: Married    Spouse name: N/A  . Number of children: N/A  . Years of education: N/A   Social History Main Topics  . Smoking status: Former Research scientist (life sciences)  . Smokeless tobacco: Never Used     Comment: smoked in college  . Alcohol use 0.0 oz/week     Comment: rarely; once or twice yearly  . Drug use: No  . Sexual activity: Not Asked   Other Topics Concern  . None   Social History Narrative  . None   No Known Allergies Family History  Problem Relation Age of Onset  . Heart disease Mother   . Prostate cancer Father        prostate  . Colon cancer Neg Hx     Past medical history, social, surgical and family history all reviewed in electronic medical record.  No pertanent information unless stated regarding to the chief complaint.   Review of Systems: No headache, visual changes, nausea, vomiting, diarrhea,  constipation, dizziness, abdominal pain, skin rash, fevers, chills, night sweats, weight loss, swollen lymph nodes, body aches, joint swelling, muscle aches, chest pain, shortness of breath, mood changes.   Objective  Blood pressure 102/68, pulse 72, weight 217 lb (98.4 kg).   Systems examined below as of 09/30/16 General: NAD A&O x3 mood, affect normal  HEENT: Pupils equal, extraocular movements intact no nystagmus Respiratory: not short of breath at rest or with speaking Cardiovascular: No lower extremity edema, non tender Skin: Warm dry intact with no signs of infection or rash on extremities or on axial skeleton. Abdomen: Soft nontender, no masses Neuro: Cranial nerves  intact, neurovascularly intact in all extremities with 2+ DTRs and 2+ pulses. Lymph: No lymphadenopathy appreciated today  Gait normal with good balance and coordination.  MSK: Non tender with full range of motion and good stability and symmetric strength and tone of shoulders, elbows, wrist, hips and ankles bilaterally.  .   Knee: Bilateral  Moderate osteophytic changes Palpation normal with no warmth, joint line tenderness, patellar tenderness, or condyle tenderness. ROM full in flexion and extension and lower leg rotation. Ligaments with solid consistent endpoints including ACL, PCL, LCL, MCL. Negative Mcmurray's, Apley's, and Thessalonian tests. Very mild Patellar glide without crepitus. Patellar and quadriceps tendons unremarkable. Hamstring and quadriceps strength is normal.      Impression and Recommendations:     This  case required medical decision making of moderate complexity.      Note: This dictation was prepared with Dragon dictation along with smaller phrase technology. Any transcriptional errors that result from this process are unintentional.

## 2016-10-14 ENCOUNTER — Other Ambulatory Visit: Payer: Self-pay | Admitting: Internal Medicine

## 2016-10-18 ENCOUNTER — Telehealth: Payer: Self-pay | Admitting: Internal Medicine

## 2016-10-18 NOTE — Telephone Encounter (Signed)
Please advise 

## 2016-10-18 NOTE — Telephone Encounter (Signed)
Pt said pharm can not longer get benazepril-hctz 20-12.5 mg and he needs to change to another bp med. [facility] on [address]

## 2016-10-18 NOTE — Telephone Encounter (Signed)
Please call in a new prescription for losartan/hydrochlorothiazide 12.5 #90 one daily

## 2016-10-19 ENCOUNTER — Other Ambulatory Visit: Payer: Self-pay | Admitting: Emergency Medicine

## 2016-10-19 MED ORDER — LOSARTAN POTASSIUM-HCTZ 100-12.5 MG PO TABS: 1.0000 | ORAL_TABLET | Freq: Every day | ORAL | 3 refills | Status: DC

## 2016-10-19 NOTE — Telephone Encounter (Signed)
Medication has been sent to the pharmacy. 

## 2016-10-19 NOTE — Telephone Encounter (Signed)
Losartan 100/12.5  #90 one daily, refill times 3

## 2016-10-19 NOTE — Telephone Encounter (Signed)
Dr. Burnice Logan which dosage would you like for the Losartan HCTZ 50-12.5 mg or 100-12.5mg ?

## 2016-10-27 ENCOUNTER — Other Ambulatory Visit: Payer: Self-pay | Admitting: Internal Medicine

## 2016-11-30 ENCOUNTER — Other Ambulatory Visit: Payer: Self-pay | Admitting: Internal Medicine

## 2016-12-07 ENCOUNTER — Other Ambulatory Visit: Payer: Self-pay | Admitting: Internal Medicine

## 2016-12-24 ENCOUNTER — Encounter: Payer: PPO | Admitting: Internal Medicine

## 2017-01-03 ENCOUNTER — Other Ambulatory Visit: Payer: Self-pay | Admitting: Internal Medicine

## 2017-01-05 ENCOUNTER — Ambulatory Visit (INDEPENDENT_AMBULATORY_CARE_PROVIDER_SITE_OTHER): Payer: PPO | Admitting: Internal Medicine

## 2017-01-05 ENCOUNTER — Encounter: Payer: Self-pay | Admitting: Internal Medicine

## 2017-01-05 VITALS — BP 118/68 | HR 63 | Temp 97.8°F | Ht 73.0 in | Wt 221.4 lb

## 2017-01-05 DIAGNOSIS — E785 Hyperlipidemia, unspecified: Secondary | ICD-10-CM

## 2017-01-05 DIAGNOSIS — Z0000 Encounter for general adult medical examination without abnormal findings: Secondary | ICD-10-CM

## 2017-01-05 DIAGNOSIS — Z8546 Personal history of malignant neoplasm of prostate: Secondary | ICD-10-CM

## 2017-01-05 DIAGNOSIS — I10 Essential (primary) hypertension: Secondary | ICD-10-CM

## 2017-01-05 DIAGNOSIS — C61 Malignant neoplasm of prostate: Secondary | ICD-10-CM

## 2017-01-05 LAB — LIPID PANEL
Cholesterol: 138 mg/dL (ref 0–200)
HDL: 60.6 mg/dL (ref >39.00)
LDL Cholesterol: 67 mg/dL (ref 0–99)
NonHDL: 77.72
Total CHOL/HDL Ratio: 2
Triglycerides: 56 mg/dL (ref 0.0–149.0)
VLDL: 11.2 mg/dL (ref 0.0–40.0)

## 2017-01-05 LAB — CBC WITH DIFFERENTIAL/PLATELET
Basophils Absolute: 0 10*3/uL (ref 0.0–0.1)
Basophils Relative: 0.6 % (ref 0.0–3.0)
Eosinophils Absolute: 0 10*3/uL (ref 0.0–0.7)
Eosinophils Relative: 0.6 % (ref 0.0–5.0)
HCT: 44.4 % (ref 39.0–52.0)
Hemoglobin: 15 g/dL (ref 13.0–17.0)
Lymphocytes Relative: 25.1 % (ref 12.0–46.0)
Lymphs Abs: 1.7 10*3/uL (ref 0.7–4.0)
MCHC: 33.8 g/dL (ref 30.0–36.0)
MCV: 91.5 fl (ref 78.0–100.0)
Monocytes Absolute: 0.5 10*3/uL (ref 0.1–1.0)
Monocytes Relative: 7.5 % (ref 3.0–12.0)
Neutro Abs: 4.5 10*3/uL (ref 1.4–7.7)
Neutrophils Relative %: 66.2 % (ref 43.0–77.0)
Platelets: 163 10*3/uL (ref 150.0–400.0)
RBC: 4.85 Mil/uL (ref 4.22–5.81)
RDW: 13.3 % (ref 11.5–15.5)
WBC: 6.8 10*3/uL (ref 4.0–10.5)

## 2017-01-05 LAB — COMPREHENSIVE METABOLIC PANEL
ALT: 22 U/L (ref 0–53)
AST: 22 U/L (ref 0–37)
Albumin: 4.3 g/dL (ref 3.5–5.2)
Alkaline Phosphatase: 80 U/L (ref 39–117)
BUN: 16 mg/dL (ref 6–23)
CO2: 29 mEq/L (ref 19–32)
Calcium: 9.4 mg/dL (ref 8.4–10.5)
Chloride: 102 mEq/L (ref 96–112)
Creatinine, Ser: 0.83 mg/dL (ref 0.40–1.50)
GFR: 98.38 mL/min (ref >60.00)
Glucose, Bld: 96 mg/dL (ref 70–99)
Potassium: 4.4 mEq/L (ref 3.5–5.1)
Sodium: 136 mEq/L (ref 135–145)
Total Bilirubin: 0.6 mg/dL (ref 0.2–1.2)
Total Protein: 6.2 g/dL (ref 6.0–8.3)

## 2017-01-05 LAB — TSH: TSH: 1.29 u[IU]/mL (ref 0.35–4.50)

## 2017-01-05 LAB — PSA: PSA: 0 ng/mL — ABNORMAL LOW (ref 0.10–4.00)

## 2017-01-05 MED ORDER — ALPRAZOLAM 0.5 MG PO TABS: 0.5000 mg | ORAL_TABLET | Freq: Two times a day (BID) | ORAL | 0 refills | Status: DC | PRN

## 2017-01-05 NOTE — Progress Notes (Signed)
Subjective:    Patient ID: Christopher [name]., male    DOB: Aug 27, 1950, 66 y.o.   MRN: [id number]  HPI  66 year old patient who is seen today for apreventive health examination as well as subsequent Medicare wellness visit. He has a history of essential hypertension and dyslipidemia.  He continues to do quite well. He has remote history of prostate cancer No new concerns or complaints  Social history.  He and his wife will be moving into a senior citizen living community in the near future  Colonoscopy January 2012 and June 2017.  Preventive Screening-Counseling & Management  Alcohol-Tobacco  Smoking Status: quit   Allergies (verified):  No Known Drug Allergies   Past History:   GERD  Hyperlipidemia  Benign prostatic hypertrophy, history of  Hypertension  prostate cancer   Past Surgical History:   Tonsillectomy  sigmoidoscopy 2004  Colonoscopy 2011 2017  prostatectomy February/2009   Family History:   father died age 65, prostate cancer  mother  died 102 history of chronic atrial fibrillation; CVD  Two brothers in good health; s/p benighn brain tumor   Social History:   Married   Past Medical History:  Diagnosis Date  . BENIGN PROSTATIC HYPERTROPHY 07/28/2006  . GERD 07/28/2006  . HYPERLIPIDEMIA 07/28/2006  . NEOPLASM, MALIGNANT, PROSTATE 08/01/2007  . NISSEN FUNDOPLICATION, HX OF 1/93/7902  . Unspecified essential hypertension 03/27/2007     Social History   Social History  . Marital status: Married    Spouse name: N/A  . Number of children: N/A  . Years of education: N/A   Occupational History  . Not on file.   Social History Main Topics  . Smoking status: Former Research scientist (life sciences)  . Smokeless tobacco: Never Used     Comment: smoked in college  . Alcohol use 0.0 oz/week     Comment: rarely; once or twice yearly  . Drug use: No  . Sexual activity: Not on file   Other Topics Concern  . Not on file   Social History Narrative  . No narrative on  file    Past Surgical History:  Procedure Laterality Date  . NISSEN FUNDOPLICATION  4097  . PROSTATECTOMY  2009  . TONSILLECTOMY      Family History  Problem Relation Age of Onset  . Heart disease Mother   . Prostate cancer Father        prostate  . Colon cancer Neg Hx     No Known Allergies  Current Outpatient Prescriptions on File Prior to Visit  Medication Sig Dispense Refill  . ALPRAZolam (XANAX) 0.5 MG tablet TAKE 1 TABLET BY MOUTH TWICE A DAY AS NEEDED 60 tablet 0  . aspirin 81 MG tablet Take 81 mg by mouth daily.      Marland Kitchen atorvastatin (LIPITOR) 40 MG tablet TAKE 1 TABLET BY MOUTH EVERY DAY 30 tablet 1  . cholecalciferol (VITAMIN D) 1000 units tablet Take 2,000 Units by mouth daily.    Marland Kitchen losartan-hydrochlorothiazide (HYZAAR) 100-12.5 MG tablet Take 1 tablet by mouth daily. 90 tablet 3  . Turmeric 500 MG CAPS Take 2 capsules by mouth daily.     No current facility-administered medications on file prior to visit.     BP 118/68 (BP Location: Left Arm, Patient Position: Sitting, Cuff Size: Normal)   Pulse 63   Temp 97.8 F (36.6 C) (Oral)   Ht 6\' 1"  (1.854 m)   Wt 221 lb 6.4 oz (100.4 kg)   SpO2 98%   BMI  29.21 kg/m   Subsequent Medicare wellness visit  1. Risk factors, based on past  M,S,F history .  Current vascular risk factors include hypertension and dyslipidemia  2.  Physical activities:remains quite active.  Does go to a health club several times per week  3.  Depression/mood:no history depression or mood disorder  4.  Hearing:no deficits  5.  ADL's:independent in all aspects of daily living  6.  Fall risk:very low  7.  Home safety:no problems identified  8.  Height weight, and visual acuity;height and weight stable no change in visual acuity  9.  Counseling: continue heart healthy diet and regular exercise regimen  10. Lab orders based on risk factors:we'll review screening lab including lipid profile and PSA  11. Referral :not appropriate at  this time  12. Care plan:continue present efforts at aggressive risk factor modification  13. Cognitive assessment:  Alert and appropriate with normal affect no cognitive dysfunction  14. Screening: Patient provided with a written and personalized 5-10 year screening schedule in the AVS.    15. Provider List Update: primary care ophthalmology and GI.  Patient has been discharge from urology   Review of Systems  Constitutional: Negative for appetite change, chills, fatigue and fever.  HENT: Negative for congestion, dental problem, ear pain, hearing loss, sore throat, tinnitus, trouble swallowing and voice change.   Eyes: Negative for pain, discharge and visual disturbance.  Respiratory: Negative for cough, chest tightness, wheezing and stridor.   Cardiovascular: Negative for chest pain, palpitations and leg swelling.  Gastrointestinal: Negative for abdominal distention, abdominal pain, blood in stool, constipation, diarrhea, nausea and vomiting.  Genitourinary: Negative for difficulty urinating, discharge, flank pain, genital sores, hematuria and urgency.  Musculoskeletal: Negative for arthralgias, back pain, gait problem, joint swelling, myalgias and neck stiffness.  Skin: Negative for rash.  Neurological: Negative for dizziness, syncope, speech difficulty, weakness, numbness and headaches.  Hematological: Negative for adenopathy. Does not bruise/bleed easily.  Psychiatric/Behavioral: Negative for behavioral problems and dysphoric mood. The patient is not nervous/anxious.        Objective:   Physical Exam  Constitutional: He appears well-developed and well-nourished.  HENT:  Head: Normocephalic and atraumatic.  Right Ear: External ear normal.  Left Ear: External ear normal.  Nose: Nose normal.  Mouth/Throat: Oropharynx is clear and moist.  Eyes: Pupils are equal, round, and reactive to light. Conjunctivae and EOM are normal. No scleral icterus.  Neck: Normal range of motion. Neck  supple. No JVD present. No thyromegaly present.  Cardiovascular: Regular rhythm, normal heart sounds and intact distal pulses.  Exam reveals no gallop and no friction rub.   No murmur heard. Pedal pulses full  Pulmonary/Chest: Effort normal and breath sounds normal. He exhibits no tenderness.  Abdominal: Soft. Bowel sounds are normal. He exhibits no distension and no mass. There is no tenderness.  Genitourinary: Prostate normal and penis normal.  Musculoskeletal: Normal range of motion. He exhibits no edema or tenderness.  Lymphadenopathy:    He has no cervical adenopathy.  Neurological: He is alert. He has normal reflexes. No cranial nerve deficit. Coordination normal.  Skin: Skin is warm and dry. No rash noted.  Cool feet, especially toes that were slightly cyanotic Pedal pulses full  Psychiatric: He has a normal mood and affect. His behavior is normal.          Assessment & Plan:   Preventive health examination Subsequent  Medicare wellness visit Essential hypertension, well-controlled Dyslipidemia.  Continue statin therapy History of prostate cancer.  Will review a PSA  Follow-up one year No change in medical regimen Review lab  Nyoka Cowden

## 2017-01-05 NOTE — Patient Instructions (Signed)
Limit your sodium (Salt) intake    It is important that you exercise regularly, at least 20 minutes 3 to 4 times per week.  If you develop chest pain or shortness of breath seek  medical attention.  Please check your blood pressure on a regular basis.  If it is consistently greater than 140/90, please make an office appointment.  Return in one year for follow-up     

## 2017-01-31 ENCOUNTER — Other Ambulatory Visit: Payer: Self-pay | Admitting: Internal Medicine

## 2017-02-02 ENCOUNTER — Other Ambulatory Visit: Payer: Self-pay | Admitting: Internal Medicine

## 2017-02-04 ENCOUNTER — Telehealth: Payer: Self-pay | Admitting: Internal Medicine

## 2017-02-04 NOTE — Telephone Encounter (Signed)
Copied from [place] [id number]. Topic: General - Other >> Feb 04, 2017 12:00 PM Darl Householder, RMA wrote: Reason for CRM: patient is currently at pharmacy to pick up medication Alprazolam 0.5 mg and pharmacy has denied stating that he ha snot been seen since 2017, but pt was just recently seen in office by Dr, K on 01/05/2017 and he is completely out of medication, please call pharmacy today and correct, and call pt at [phone] once medication has been corrected and filled

## 2017-02-04 NOTE — Telephone Encounter (Addendum)
Refill request was denied by our office on 02/03/17 stating patient had not been seen in > 1 year.  However, patient was seen for CPE on 01/05/17. Per Dr. Raliegh Ip, follow-up in 1 year, no changes to meds.   Spoke w/ patient and informed him that Dr. Raliegh Ip is out of the office today and this medication requires provider approval. He states he can wait for refill until Monday.  Dr. Raliegh Ip, okay to call in Xanax 0.5 mg BID PRN #[id number]? Do you want to send any refills?

## 2017-02-07 MED ORDER — ALPRAZOLAM 0.5 MG PO TABS: 0.5000 mg | ORAL_TABLET | Freq: Two times a day (BID) | ORAL | 0 refills | Status: DC | PRN

## 2017-02-07 NOTE — Telephone Encounter (Signed)
Medication refill  phoned to pharmacy as requested.

## 2017-02-07 NOTE — Telephone Encounter (Signed)
Okay for refill?  

## 2017-02-24 ENCOUNTER — Encounter: Payer: Self-pay | Admitting: Family Medicine

## 2017-02-24 ENCOUNTER — Ambulatory Visit: Payer: PPO | Admitting: Family Medicine

## 2017-02-24 DIAGNOSIS — M222X1 Patellofemoral disorders, right knee: Secondary | ICD-10-CM

## 2017-02-24 DIAGNOSIS — M222X2 Patellofemoral disorders, left knee: Secondary | ICD-10-CM

## 2017-02-24 NOTE — Patient Instructions (Signed)
Great to see you  Christopher [name] is your friend Good luck with the rest of the move We have the other injections if you need them  See me again in 3-4 weeks just in case.

## 2017-02-24 NOTE — Assessment & Plan Note (Signed)
Patient will well for 7 months started having increasing discomfort again.  We discussed starting the exercises again, home exercise, which activities to do which wants to avoid.  Patient came to increase activity as tolerated.  Follow-up with me again in 4 weeks.  Could be a candidate for Visco supplementation

## 2017-02-24 NOTE — Progress Notes (Signed)
[name] [place], [place] [address] Phone: [phone] Subjective:     CC: Bilateral knee pain  EXB:[id number]  Christopher [name]. is a 66 y.o. male coming in for follow up for bilateral knee pain. Patient is moving and has had knee pain since starting to pack in November.  Has hx of OA.       Past Medical History:  Diagnosis Date  . BENIGN PROSTATIC HYPERTROPHY 07/28/2006  . GERD 07/28/2006  . HYPERLIPIDEMIA 07/28/2006  . NEOPLASM, MALIGNANT, PROSTATE 08/01/2007  . NISSEN FUNDOPLICATION, HX OF 0/27/2536  . Unspecified essential hypertension 03/27/2007   Past Surgical History:  Procedure Laterality Date  . NISSEN FUNDOPLICATION  6440  . PROSTATECTOMY  2009  . TONSILLECTOMY     Social History   Socioeconomic History  . Marital status: Married    Spouse name: Not on file  . Number of children: Not on file  . Years of education: Not on file  . Highest education level: Not on file  Social Needs  . Financial resource strain: Not on file  . Food insecurity - worry: Not on file  . Food insecurity - inability: Not on file  . Transportation needs - medical: Not on file  . Transportation needs - non-medical: Not on file  Occupational History  . Not on file  Tobacco Use  . Smoking status: Former Research scientist (life sciences)  . Smokeless tobacco: Never Used  . Tobacco comment: smoked in college  Substance and Sexual Activity  . Alcohol use: Yes    Alcohol/week: 0.0 oz    Comment: rarely; once or twice yearly  . Drug use: No  . Sexual activity: Not on file  Other Topics Concern  . Not on file  Social History Narrative  . Not on file   No Known Allergies Family History  Problem Relation Age of Onset  . Heart disease Mother   . Prostate cancer Father        prostate  . Colon cancer Neg Hx      Past medical history, social, surgical and family history all reviewed in electronic medical record.  No pertanent information unless stated regarding  to the chief complaint.   Review of Systems:Review of systems updated and as accurate as of 02/24/17  No headache, visual changes, nausea, vomiting, diarrhea, constipation, dizziness, abdominal pain, skin rash, fevers, chills, night sweats, weight loss, swollen lymph nodes, body aches, joint swelling, muscle aches, chest pain, shortness of breath, mood changes.   Objective  There were no vitals taken for this visit. Systems examined below as of 02/24/17   General: No apparent distress alert and oriented x3 mood and affect normal, dressed appropriately.  HEENT: Pupils equal, extraocular movements intact  Respiratory: Patient's speak in full sentences and does not appear short of breath  Cardiovascular: No lower extremity edema, non tender, no erythema  Skin: Warm dry intact with no signs of infection or rash on extremities or on axial skeleton.  Abdomen: Soft nontender  Neuro: Cranial nerves II through XII are intact, neurovascularly intact in all extremities with 2+ DTRs and 2+ pulses.  Lymph: No lymphadenopathy of posterior or anterior cervical chain or axillae bilaterally.  Gait normal with good balance and coordination.  MSK:  Non tender with full range of motion and good stability and symmetric strength and tone of shoulders, elbows, wrist, hip, and ankles bilaterally.  Knee:bilateral  valgus deformity noted. Large thigh to calf ratio.  Tender to palpation  over medial and PF joint line.  ROM full in flexion and extension and lower leg rotation. instability with valgus force.  painful patellar compression. Patellar glide with moderate crepitus. Patellar and quadriceps tendons unremarkable. Hamstring and quadriceps strength is normal.  After informed written and verbal consent, patient was seated on exam table. Right knee was prepped with alcohol swab and utilizing anterolateral approach, patient's right knee space was injected with 4:1  marcaine 0.5%: Kenalog 40mg /dL. Patient  tolerated the procedure well without immediate complications.  After informed written and verbal consent, patient was seated on exam table. Left knee was prepped with alcohol swab and utilizing anterolateral approach, patient's left knee space was injected with 4:1  marcaine 0.5%: Kenalog 40mg /dL. Patient tolerated the procedure well without immediate complications.   Impression and Recommendations:     This case required medical decision making of moderate complexity.      Note: This dictation was prepared with Dragon dictation along with smaller phrase technology. Any transcriptional errors that result from this process are unintentional.

## 2017-03-09 ENCOUNTER — Other Ambulatory Visit: Payer: Self-pay | Admitting: Internal Medicine

## 2017-03-31 ENCOUNTER — Ambulatory Visit: Payer: PPO | Admitting: Family Medicine

## 2017-04-07 ENCOUNTER — Other Ambulatory Visit: Payer: Self-pay | Admitting: Internal Medicine

## 2017-04-19 ENCOUNTER — Other Ambulatory Visit: Payer: Self-pay | Admitting: Internal Medicine

## 2017-04-19 NOTE — Telephone Encounter (Signed)
Copied from [place] [id number]. Topic: Quick Communication - Rx Refill/Question >> Apr 19, 2017  1:51 PM Cleaster Corin, [place] wrote: Medication: xanax   Has the patient contacted their pharmacy? yes  (Agent: If no, request that the patient contact the pharmacy for the refill.)   Preferred Pharmacy (with phone number or street name): [id number] [address], [place] - [address] [place] [address] Phone: [phone] Fax: [phone]     Agent: Please be advised that RX refills may take up to 3 business days. We ask that you follow-up with your pharmacy.

## 2017-04-21 NOTE — Telephone Encounter (Signed)
Relation to pt: self Call back number: [phone] Pharmacy: [id number] [place], [place] - [address] [phone] (Phone) [phone] (Fax)     Reason for call:  Patient checking on the status of medication refill request, please advise

## 2017-04-22 ENCOUNTER — Other Ambulatory Visit: Payer: Self-pay

## 2017-04-22 MED ORDER — ALPRAZOLAM 0.5 MG PO TABS: 0.5000 mg | ORAL_TABLET | Freq: Two times a day (BID) | ORAL | 0 refills | Status: DC | PRN

## 2017-04-22 NOTE — Telephone Encounter (Signed)
Medication printed

## 2017-04-22 NOTE — Telephone Encounter (Signed)
A referral ws put in for [facility].

## 2017-04-22 NOTE — Telephone Encounter (Signed)
Please advise Xanax referral.

## 2017-04-22 NOTE — Telephone Encounter (Signed)
Ignore last note typo.

## 2017-04-23 ENCOUNTER — Telehealth: Payer: Self-pay | Admitting: Family Medicine

## 2017-04-23 NOTE — Telephone Encounter (Signed)
Notified by after hours service pt upset, needs rx for Xanax.  Pt stating he called the office multiple times trying to get a refill.  Pt stating his wife also take the med, but is out of hers.  Per chart review rx was printed out on Friday.  Unclear if pt was notified to pick this up.   After hours service agent asks if this provider can call the pt.  This provider declined.  Can advise pt to go to [facility] or ED if he feels he cannot wait until the office opens on Monday.  Grier Mitts, MD

## 2017-04-25 NOTE — Telephone Encounter (Signed)
Rx faxed to pharmacy but fax did not go through. Rx phoned to pharmacy.  Patient's wife notified rx has been sent.

## 2017-04-25 NOTE — Telephone Encounter (Signed)
Okay for refill?  

## 2017-04-25 NOTE — Telephone Encounter (Signed)
This was printed Friday.

## 2017-05-23 ENCOUNTER — Telehealth: Payer: Self-pay | Admitting: Internal Medicine

## 2017-05-23 ENCOUNTER — Other Ambulatory Visit: Payer: Self-pay | Admitting: Internal Medicine

## 2017-05-23 NOTE — Telephone Encounter (Signed)
Spoke to patient and informed him that he was soon to be due for a face to face for medication refill (Xanax)

## 2017-05-23 NOTE — Telephone Encounter (Unsigned)
Copied from [place] [id number]. Topic: Quick Communication - See Telephone Encounter >> May 23, 2017  2:22 PM Hewitt Shorts wrote: CRM for notification. See Telephone encounter for:  Pt is needing a refill on his alprazolam [facility] [place]  05/23/17.  Best number [phone]

## 2017-06-04 ENCOUNTER — Other Ambulatory Visit: Payer: Self-pay | Admitting: Internal Medicine

## 2017-07-04 ENCOUNTER — Other Ambulatory Visit: Payer: Self-pay | Admitting: Internal Medicine

## 2017-07-29 ENCOUNTER — Other Ambulatory Visit: Payer: Self-pay | Admitting: Internal Medicine

## 2017-08-05 ENCOUNTER — Other Ambulatory Visit: Payer: Self-pay

## 2017-08-05 MED ORDER — ATORVASTATIN CALCIUM 40 MG PO TABS: 40.0000 mg | ORAL_TABLET | Freq: Every day | ORAL | 1 refills | Status: DC

## 2017-08-11 ENCOUNTER — Other Ambulatory Visit: Payer: Self-pay | Admitting: Internal Medicine

## 2017-08-11 NOTE — Telephone Encounter (Signed)
Ok to refill? Please advise.  

## 2017-08-11 NOTE — Telephone Encounter (Signed)
Okay for refill?  

## 2017-08-12 ENCOUNTER — Other Ambulatory Visit: Payer: Self-pay

## 2017-08-29 ENCOUNTER — Other Ambulatory Visit: Payer: Self-pay | Admitting: Internal Medicine

## 2017-08-31 MED ORDER — ATORVASTATIN CALCIUM 40 MG PO TABS: 40.0000 mg | ORAL_TABLET | Freq: Every day | ORAL | 0 refills | Status: DC

## 2017-08-31 NOTE — Telephone Encounter (Signed)
Sent to the pharmacy by e-scribe for 90 days.  Last filled 08/05/17 for 2 months.  Pt due 12/2017 for lab work.

## 2017-09-18 ENCOUNTER — Other Ambulatory Visit: Payer: Self-pay | Admitting: Internal Medicine

## 2017-09-21 ENCOUNTER — Encounter: Payer: Self-pay | Admitting: Internal Medicine

## 2017-09-21 ENCOUNTER — Ambulatory Visit (INDEPENDENT_AMBULATORY_CARE_PROVIDER_SITE_OTHER): Payer: Medicare HMO | Admitting: Internal Medicine

## 2017-09-21 VITALS — BP 110/70 | HR 63 | Temp 97.8°F | Wt 216.2 lb

## 2017-09-21 DIAGNOSIS — I10 Essential (primary) hypertension: Secondary | ICD-10-CM | POA: Diagnosis not present

## 2017-09-21 DIAGNOSIS — E785 Hyperlipidemia, unspecified: Secondary | ICD-10-CM | POA: Diagnosis not present

## 2017-09-21 MED ORDER — LOSARTAN POTASSIUM-HCTZ 100-12.5 MG PO TABS: 1.0000 | ORAL_TABLET | Freq: Every day | ORAL | 3 refills | Status: DC

## 2017-09-21 MED ORDER — ALPRAZOLAM 0.5 MG PO TABS: 0.5000 mg | ORAL_TABLET | Freq: Two times a day (BID) | ORAL | 3 refills | Status: DC | PRN

## 2017-09-21 MED ORDER — ATORVASTATIN CALCIUM 40 MG PO TABS: 40.0000 mg | ORAL_TABLET | Freq: Every day | ORAL | 4 refills | Status: DC

## 2017-09-21 NOTE — Patient Instructions (Signed)
Limit your sodium (Salt) intake  Please check your blood pressure on a regular basis.  If it is consistently greater than 150/90, please make an office appointment.  Return in 6 months for follow-up  GOOD LUCK!!

## 2017-09-21 NOTE — Progress Notes (Signed)
Subjective:    Patient ID: Christopher [name]., male    DOB: 09/28/1950, 67 y.o.   MRN: [id number]  HPI 67 year old patient who is seen today for follow-up.  He has a history of essential hypertension mild anxiety disorder as well as dyslipidemia. Medical regimen includes alprazolam that he takes once or twice daily. He and his wife have recently relocated to a retirement complex.  In general doing quite well.  Past Medical History:  Diagnosis Date  . BENIGN PROSTATIC HYPERTROPHY 07/28/2006  . GERD 07/28/2006  . HYPERLIPIDEMIA 07/28/2006  . NEOPLASM, MALIGNANT, PROSTATE 08/01/2007  . NISSEN FUNDOPLICATION, HX OF 6/38/7564  . Unspecified essential hypertension 03/27/2007     Social History   Socioeconomic History  . Marital status: Married    Spouse name: Not on file  . Number of children: Not on file  . Years of education: Not on file  . Highest education level: Not on file  Occupational History  . Not on file  Social Needs  . Financial resource strain: Not on file  . Food insecurity:    Worry: Not on file    Inability: Not on file  . Transportation needs:    Medical: Not on file    Non-medical: Not on file  Tobacco Use  . Smoking status: Former Research scientist (life sciences)  . Smokeless tobacco: Never Used  . Tobacco comment: smoked in college  Substance and Sexual Activity  . Alcohol use: Yes    Alcohol/week: 0.0 oz    Comment: rarely; once or twice yearly  . Drug use: No  . Sexual activity: Not on file  Lifestyle  . Physical activity:    Days per week: Not on file    Minutes per session: Not on file  . Stress: Not on file  Relationships  . Social connections:    Talks on phone: Not on file    Gets together: Not on file    Attends religious service: Not on file    Active member of club or organization: Not on file    Attends meetings of clubs or organizations: Not on file    Relationship status: Not on file  . Intimate partner violence:    Fear of current or ex partner: Not on file     Emotionally abused: Not on file    Physically abused: Not on file    Forced sexual activity: Not on file  Other Topics Concern  . Not on file  Social History Narrative  . Not on file    Past Surgical History:  Procedure Laterality Date  . NISSEN FUNDOPLICATION  3329  . PROSTATECTOMY  2009  . TONSILLECTOMY      Family History  Problem Relation Age of Onset  . Heart disease Mother   . Prostate cancer Father        prostate  . Colon cancer Neg Hx     No Known Allergies  Current Outpatient Medications on File Prior to Visit  Medication Sig Dispense Refill  . aspirin 81 MG tablet Take 81 mg by mouth daily.      . cholecalciferol (VITAMIN D) 1000 units tablet Take 2,000 Units by mouth daily.    . Turmeric 500 MG CAPS Take 2 capsules by mouth daily.     No current facility-administered medications on file prior to visit.     BP 110/70 (BP Location: Right Arm, Patient Position: Sitting, Cuff Size: Large)   Pulse 63   Temp 97.8 F (36.6 C) (Oral)  Wt 216 lb 3.2 oz (98.1 kg)   SpO2 99%   BMI 28.52 kg/m      Review of Systems  Constitutional: Negative for appetite change, chills, fatigue and fever.  HENT: Negative for congestion, dental problem, ear pain, hearing loss, sore throat, tinnitus, trouble swallowing and voice change.   Eyes: Negative for pain, discharge and visual disturbance.  Respiratory: Negative for cough, chest tightness, wheezing and stridor.   Cardiovascular: Negative for chest pain, palpitations and leg swelling.  Gastrointestinal: Negative for abdominal distention, abdominal pain, blood in stool, constipation, diarrhea, nausea and vomiting.  Genitourinary: Negative for difficulty urinating, discharge, flank pain, genital sores, hematuria and urgency.  Musculoskeletal: Negative for arthralgias, back pain, gait problem, joint swelling, myalgias and neck stiffness.  Skin: Negative for rash.  Neurological: Negative for dizziness, syncope, speech  difficulty, weakness, numbness and headaches.  Hematological: Negative for adenopathy. Does not bruise/bleed easily.  Psychiatric/Behavioral: Negative for behavioral problems and dysphoric mood. The patient is nervous/anxious.        Objective:   Physical Exam  Constitutional: He is oriented to person, place, and time. He appears well-developed.  Blood pressure 110/70 Weight 216  HENT:  Head: Normocephalic.  Right Ear: External ear normal.  Left Ear: External ear normal.  Eyes: Conjunctivae and EOM are normal.  Neck: Normal range of motion.  Cardiovascular: Normal rate and normal heart sounds.  Pulmonary/Chest: Breath sounds normal.  Abdominal: Bowel sounds are normal.  Musculoskeletal: Normal range of motion. He exhibits no edema or tenderness.  Neurological: He is alert and oriented to person, place, and time.  Psychiatric: He has a normal mood and affect. His behavior is normal.          Assessment & Plan:  Essential hypertension well-controlled  Anxiety disorder.  Alprazolam refilled.  Patient will make an attempt to minimize use  Follow-up with new provider in 4 to 6 months  Marletta Lor

## 2017-10-14 ENCOUNTER — Ambulatory Visit (INDEPENDENT_AMBULATORY_CARE_PROVIDER_SITE_OTHER): Payer: Medicare HMO | Admitting: Family Medicine

## 2017-10-14 ENCOUNTER — Encounter: Payer: Self-pay | Admitting: Family Medicine

## 2017-10-14 VITALS — BP 126/80 | HR 77 | Ht 73.0 in | Wt 218.5 lb

## 2017-10-14 DIAGNOSIS — E785 Hyperlipidemia, unspecified: Secondary | ICD-10-CM

## 2017-10-14 DIAGNOSIS — I10 Essential (primary) hypertension: Secondary | ICD-10-CM

## 2017-10-14 DIAGNOSIS — R69 Illness, unspecified: Secondary | ICD-10-CM | POA: Diagnosis not present

## 2017-10-14 DIAGNOSIS — F419 Anxiety disorder, unspecified: Secondary | ICD-10-CM

## 2017-10-14 MED ORDER — PAROXETINE HCL ER 12.5 MG PO TB24: 12.5000 mg | ORAL_TABLET | Freq: Every day | ORAL | 2 refills | Status: DC

## 2017-10-14 NOTE — Progress Notes (Addendum)
Subjective:  Patient ID: Christopher [name]., male    DOB: June 14, 1950  Age: 67 y.o. MRN: [id number]  CC: Establish Care   HPI Christopher [name]. presents for establishment of care the transfer from his existing doctor who is retiring.  Patient has hypertension and dyslipidemia that are well can controlled with his atorvastatin and Hyzaar.  He is having no problems taking these medicines.  He is status post prostatectomy for cancer in 2008.  He is retired from [employer].  He describes his job as being high stress.  He lives at home with his wife who is type a personality.  He says it is stressful living with her at times.  They have no children.  He does not smoke, drink alcohol or use illicit drugs.  He goes to the gym 3-5 times a week.  He has been taking Xanax 0.25 mg each morning and 0.5 mg in the evening on average.  He has been doing this for some years.  He said initially it is been added to help with blood pressure control.  He does not feel anxious but he does admit to irritability and impatience.  Adamantly denies any depression symptoms.  He has tried no other medications for his anxiety.  He admits to nocturia x2-3 nightly.  He is continent status post prostatectomy is otherwise not an issue.  Outpatient Medications Prior to Visit  Medication Sig Dispense Refill  . ALPRAZolam (XANAX) 0.5 MG tablet Take 1 tablet (0.5 mg total) by mouth 2 (two) times daily as needed. 60 tablet 3  . aspirin 81 MG tablet Take 81 mg by mouth daily.      Marland Kitchen atorvastatin (LIPITOR) 40 MG tablet Take 1 tablet (40 mg total) by mouth daily. 90 tablet 4  . cholecalciferol (VITAMIN D) 1000 units tablet Take 2,000 Units by mouth daily.    Marland Kitchen losartan-hydrochlorothiazide (HYZAAR) 100-12.5 MG tablet Take 1 tablet by mouth daily. 90 tablet 3  . Turmeric 500 MG CAPS Take 2 capsules by mouth daily.     No facility-administered medications prior to visit.     ROS Review of Systems  Constitutional: Negative for  chills, fatigue, fever and unexpected weight change.  HENT: Negative.   Eyes: Negative.   Respiratory: Negative.   Cardiovascular: Negative.   Gastrointestinal: Negative.   Genitourinary: Negative for difficulty urinating and frequency.  Musculoskeletal: Negative for gait problem and joint swelling.  Skin: Negative for pallor and rash.  Allergic/Immunologic: Negative for immunocompromised state.  Neurological: Negative for speech difficulty and weakness.  Hematological: Does not bruise/bleed easily.  Psychiatric/Behavioral: Negative for dysphoric mood. The patient is nervous/anxious.     Objective:  BP 126/80   Pulse 77   Ht 6\' 1"  (1.854 m)   Wt 218 lb 8 oz (99.1 kg)   SpO2 97%   BMI 28.83 kg/m   BP Readings from Last 3 Encounters:  10/14/17 126/80  09/21/17 110/70  02/24/17 122/82    Wt Readings from Last 3 Encounters:  10/14/17 218 lb 8 oz (99.1 kg)  09/21/17 216 lb 3.2 oz (98.1 kg)  02/24/17 221 lb (100.2 kg)    Physical Exam  Constitutional: He is oriented to person, place, and time. He appears well-developed and well-nourished. No distress.  HENT:  Head: Normocephalic and atraumatic.  Right Ear: External ear normal.  Left Ear: External ear normal.  Mouth/Throat: Oropharynx is clear and moist.  Eyes: Pupils are equal, round, and reactive to light. Conjunctivae  and EOM are normal. Right eye exhibits no discharge. Left eye exhibits no discharge. No scleral icterus.  Neck: No JVD present. No tracheal deviation present. No thyromegaly present.  Cardiovascular: Normal rate, regular rhythm and normal heart sounds.  Pulmonary/Chest: Effort normal and breath sounds normal.  Abdominal: Bowel sounds are normal.  Lymphadenopathy:    He has no cervical adenopathy.  Neurological: He is alert and oriented to person, place, and time.  Skin: Skin is warm and dry. He is not diaphoretic.  Psychiatric: He has a normal mood and affect. His behavior is normal.    Lab Results    Component Value Date   WBC 6.8 01/05/2017   HGB 15.0 01/05/2017   HCT 44.4 01/05/2017   PLT 163.0 01/05/2017   GLUCOSE 96 01/05/2017   CHOL 138 01/05/2017   TRIG 56.0 01/05/2017   HDL 60.60 01/05/2017   LDLCALC 67 01/05/2017   ALT 22 01/05/2017   AST 22 01/05/2017   NA 136 01/05/2017   K 4.4 01/05/2017   CL 102 01/05/2017   CREATININE 0.83 01/05/2017   BUN 16 01/05/2017   CO2 29 01/05/2017   TSH 1.29 01/05/2017   PSA 0.00 (L) 01/05/2017    Dg Knee Bilateral Standing Ap  Result Date: 04/07/2016 CLINICAL DATA:  Knee pain.  No known injury.  Initial evaluation . EXAM: BILATERAL KNEES STANDING - 1 VIEW COMPARISON:  No recent prior . FINDINGS: No acute bony or joint abnormality identified. No evidence fracture or dislocation mild bilateral medial compartment degenerative change . IMPRESSION: Mild bilateral medial compartment degenerative change. Electronically Signed   By: Marcello Moores  Register   On: 04/07/2016 16:46    Assessment & Plan:   Branton was seen today for establish care.  Diagnoses and all orders for this visit:  Essential hypertension  Dyslipidemia  Anxiety -     Discontinue: PARoxetine (PAXIL-CR) 12.5 MG 24 hr tablet; Take 1 tablet (12.5 mg total) by mouth daily. -     escitalopram (LEXAPRO) 10 MG tablet; Take 1 tablet (10 mg total) by mouth daily.   I have discontinued Tavoris B. Obeirne Jr.'s PARoxetine. I am also having him start on escitalopram. Additionally, I am having him maintain his aspirin, Turmeric, cholecalciferol, ALPRAZolam, atorvastatin, and losartan-hydrochlorothiazide.  Meds ordered this encounter  Medications  . DISCONTD: PARoxetine (PAXIL-CR) 12.5 MG 24 hr tablet    Sig: Take 1 tablet (12.5 mg total) by mouth daily.    Dispense:  30 tablet    Refill:  2  . escitalopram (LEXAPRO) 10 MG tablet    Sig: Take 1 tablet (10 mg total) by mouth daily.    Dispense:  30 tablet    Refill:  0    Stop Paxil.   We had a long chat about issues and concerns  with long-term Xanax therapy especially in folks who are older.  Patient agreed to try low-dose Paxil and initiate a very slow and guarded Xanax taper.  He will start taking 0.25 of Xanax twice daily for 2 weeks.  He will then alternate 0.25 of Xanax twice daily with Xanax 0.25 daily for 2 weeks.  He will then take Xanax 0.25 daily for 2 weeks.  Then he will take Xanax 0.25 every other day for 2 weeks and then to 5 every third day for 2 weeks and stop.  Follow-up will be in 1 month.  9/19: Cannot tolerate Paxil. Will try Lexapro.   Follow-up: Return in about 1 month (around 11/14/2017).  Mortimer Fries  Ethelene Hal, MD

## 2017-10-18 ENCOUNTER — Telehealth: Payer: Self-pay | Admitting: Family Medicine

## 2017-10-18 NOTE — Telephone Encounter (Signed)
Agreed -

## 2017-10-18 NOTE — Telephone Encounter (Signed)
I called and spoke with patient. He was taking the Paxil at night time, so he will try taking it in the morning to see how that does. Patient did mention another side effect that he was experiencing was about 8-10 minutes after taking the medication, he laid down and he felt like the light in the room was spinning. He's unsure if this was from the medication or if it was just him, he stated that it did not feel uncomfortable. Patient did mention that the medication did cost him around $130 just for a one month supply, so if we do change the medication, he would like something that is cheaper.

## 2017-10-18 NOTE — Telephone Encounter (Signed)
Take paxil in morning and continue xanax as directed.

## 2017-10-18 NOTE — Telephone Encounter (Signed)
Will keep in in-basket to check on patient in a few days.

## 2017-10-18 NOTE — Telephone Encounter (Signed)
Copied from [place] [id number]. Topic: General - Other >> Oct 18, 2017  8:23 AM Lennox Solders wrote: Reason for CRM: pt is calling he started paxil 2 day ago and he is experiencing side effect of sleeplessness. . Please advise. [facility] [place]

## 2017-10-19 NOTE — Telephone Encounter (Signed)
I called to check on patient. Patient stated that he took the Paxil this morning along with 1/2 Xanax like he normally does, and he tolerated the medication well. He did feel a little drowsy and sluggish for about an hour, and then it passed. I advised patient to continue his medications and to let us know if he experiences anymore side effects. Patient verbalized understanding.

## 2017-10-19 NOTE — Telephone Encounter (Signed)
Thanks

## 2017-10-20 NOTE — Progress Notes (Deleted)
[name] [address] [place], [place] [address] Phone: [phone] Subjective:    I'm seeing this patient by the request  of:    CC:   [name]  Christopher [name]. is a 67 y.o. male coming in with complaint of ***  Onset-  Location Duration-  Character- Aggravating factors- Reliving factors-  Therapies tried-  Severity-     Past Medical History:  Diagnosis Date  . BENIGN PROSTATIC HYPERTROPHY 07/28/2006  . GERD 07/28/2006  . HYPERLIPIDEMIA 07/28/2006  . NEOPLASM, MALIGNANT, PROSTATE 08/01/2007  . NISSEN FUNDOPLICATION, HX OF 2/77/4128  . Unspecified essential hypertension 03/27/2007   Past Surgical History:  Procedure Laterality Date  . NISSEN FUNDOPLICATION  7867  . PROSTATECTOMY  2009  . TONSILLECTOMY     Social History   Socioeconomic History  . Marital status: Married    Spouse name: Not on file  . Number of children: Not on file  . Years of education: Not on file  . Highest education level: Not on file  Occupational History  . Not on file  Social Needs  . Financial resource strain: Not on file  . Food insecurity:    Worry: Not on file    Inability: Not on file  . Transportation needs:    Medical: Not on file    Non-medical: Not on file  Tobacco Use  . Smoking status: Former Research scientist (life sciences)  . Smokeless tobacco: Never Used  . Tobacco comment: smoked in college  Substance and Sexual Activity  . Alcohol use: Yes    Alcohol/week: 0.0 standard drinks    Comment: rarely; once or twice yearly  . Drug use: No  . Sexual activity: Not on file  Lifestyle  . Physical activity:    Days per week: Not on file    Minutes per session: Not on file  . Stress: Not on file  Relationships  . Social connections:    Talks on phone: Not on file    Gets together: Not on file    Attends religious service: Not on file    Active member of club or organization: Not on file    Attends meetings of clubs or organizations: Not on file   Relationship status: Not on file  Other Topics Concern  . Not on file  Social History Narrative  . Not on file   No Known Allergies Family History  Problem Relation Age of Onset  . Heart disease Mother   . Prostate cancer Father        prostate  . Colon cancer Neg Hx      Past medical history, social, surgical and family history all reviewed in electronic medical record.  No pertanent information unless stated regarding to the chief complaint.   Review of Systems:Review of systems updated and as accurate as of 10/20/17  No headache, visual changes, nausea, vomiting, diarrhea, constipation, dizziness, abdominal pain, skin rash, fevers, chills, night sweats, weight loss, swollen lymph nodes, body aches, joint swelling, muscle aches, chest pain, shortness of breath, mood changes.   Objective  There were no vitals taken for this visit. Systems examined below as of 10/20/17   General: No apparent distress alert and oriented x3 mood and affect normal, dressed appropriately.  HEENT: Pupils equal, extraocular movements intact  Respiratory: Patient's speak in full sentences and does not appear short of breath  Cardiovascular: No lower extremity edema, non tender, no erythema  Skin: Warm dry intact with no signs of infection  or rash on extremities or on axial skeleton.  Abdomen: Soft nontender  Neuro: Cranial nerves II through XII are intact, neurovascularly intact in all extremities with 2+ DTRs and 2+ pulses.  Lymph: No lymphadenopathy of posterior or anterior cervical chain or axillae bilaterally.  Gait normal with good balance and coordination.  MSK:  Non tender with full range of motion and good stability and symmetric strength and tone of shoulders, elbows, wrist, hip, knee and ankles bilaterally.     Impression and Recommendations:     This case required medical decision making of moderate complexity.      Note: This dictation was prepared with Dragon dictation along with  smaller phrase technology. Any transcriptional errors that result from this process are unintentional.

## 2017-10-24 ENCOUNTER — Encounter

## 2017-10-24 ENCOUNTER — Ambulatory Visit: Payer: Medicare HMO | Admitting: Family Medicine

## 2017-10-24 ENCOUNTER — Telehealth: Payer: Self-pay | Admitting: Family Medicine

## 2017-10-24 DIAGNOSIS — Z0289 Encounter for other administrative examinations: Secondary | ICD-10-CM

## 2017-10-24 MED ORDER — ESCITALOPRAM OXALATE 10 MG PO TABS: 10.0000 mg | ORAL_TABLET | Freq: Every day | ORAL | 0 refills | Status: DC

## 2017-10-24 NOTE — Telephone Encounter (Signed)
I called and spoke with patient. He is aware that the Rx is at the pharmacy for him. He will pick up the Rx tomorrow and start taking it in the morning. Nothing further needed at this time. Patient will update [place] on how he's doing in a few days.

## 2017-10-24 NOTE — Telephone Encounter (Signed)
Can switch taking Paroxetine to morning time.

## 2017-10-24 NOTE — Telephone Encounter (Signed)
Copied from [place] [id number]. Topic: General - Other >> Oct 24, 2017  8:30 AM Keene Breath wrote: Reason for CRM: Patient called to speak with nurse or doctor regarding a change in his medications (PARoxetine (PAXIL-CR) 12.5 MG 24 hr tablet and ALPRAZolam (XANAX) 0.5 MG tablet).  Patient stated that he believes the combination of these two medications is causing him to lose sleep.  He is not sleeping well at all and needs some advice on what to do.  CB# [phone].

## 2017-10-24 NOTE — Addendum Note (Signed)
Addended by: Jon Billings on: 10/24/2017 04:50 PM   Modules accepted: Orders

## 2017-10-24 NOTE — Telephone Encounter (Signed)
Have sent Rx for lexapro to pharmacy. Same directions. Take it daily as well. Time of day taken is dependent on side effects of med for him.

## 2017-10-24 NOTE — Telephone Encounter (Signed)
Patient is taking the Paroxetine in the morning.

## 2017-10-31 NOTE — Telephone Encounter (Signed)
Patient calling with additonal questions and an update on the change to escitalopram (LEXAPRO) 10 MG tablet. Please call patient to discuss.

## 2017-10-31 NOTE — Telephone Encounter (Signed)
I called and spoke with patient about medication. Patient said that it is an improvement over the Paxil and he is able to sleep at night. Patient did mention that he is experiencing lightheadness for about 45 minutes after he takes the medication in the mornings. Patient wants to know if this will go away over time or if he can start taking the medication at night time instead. Patient is aware that Dr. Ethelene Hal is not in the office today, and I will give him a call back in the morning once Dr. Ethelene Hal sees this message.

## 2017-11-01 NOTE — Telephone Encounter (Signed)
Agreed and thanks

## 2017-11-01 NOTE — Telephone Encounter (Signed)
I spoke with Dr. Ethelene Hal and he said that the lightheadedness would go away after taking the medication for a little while, but that patient could take the medication at night. I called and spoke with patient and advised him of the message above. Patient will try to start taking the medication at night to see if that helps better. Patient has also been decreasing his Xanax by 1/2 tablet at night instead of a full tablet and it seems to be doing okay. Patient is able to sleep for longer than he use to be able to. Patient will keep [place] updated with any further side effects or concerns.

## 2017-11-08 ENCOUNTER — Telehealth: Payer: Self-pay

## 2017-11-08 NOTE — Telephone Encounter (Signed)
Copied from [place] [id number]. Topic: General - Other >> Nov 08, 2017  9:09 AM Yvette Rack wrote: Reason for CRM: pt states that for the last 25 nights he hasn't been getting any sleep maybe 3 1/2 hours to 4 hours worth of sleep due to taking Lexapro and he would like to get of of medicine and be put back on the medicine that he was on before he saw Ethelene Hal Xanax

## 2017-11-09 NOTE — Telephone Encounter (Signed)
Will defer to Dr. Ethelene Hal as he may consider starting him on another SSRI as well in place of lexapro.

## 2017-11-09 NOTE — Telephone Encounter (Signed)
Per Dr. Ethelene Hal - okay for patient to continue taking his Xanax.   Dr. Zigmund Daniel - can you please advise on how patient should taper off of the Lexapro since Dr. Ethelene Hal is gone for the afternoon?

## 2017-11-10 NOTE — Telephone Encounter (Signed)
Dr. Ethelene Hal - please advise on how patient should taper down off of the Lexapro.

## 2017-11-10 NOTE — Telephone Encounter (Signed)
I called and spoke with patient. I advised him that he can stop taking the Lexapro, patient verbalized understanding. Patient aware of follow up appointment on Monday.

## 2017-11-10 NOTE — Telephone Encounter (Signed)
Since he has only taken it for a few weeks, he can just stop it. We will discuss trying a new med on Monday.

## 2017-11-14 ENCOUNTER — Ambulatory Visit (INDEPENDENT_AMBULATORY_CARE_PROVIDER_SITE_OTHER): Payer: Medicare HMO | Admitting: Family Medicine

## 2017-11-14 ENCOUNTER — Encounter: Payer: Self-pay | Admitting: Family Medicine

## 2017-11-14 VITALS — BP 118/80 | HR 69 | Temp 97.9°F | Ht 73.0 in | Wt 220.4 lb

## 2017-11-14 DIAGNOSIS — R69 Illness, unspecified: Secondary | ICD-10-CM | POA: Diagnosis not present

## 2017-11-14 DIAGNOSIS — F419 Anxiety disorder, unspecified: Secondary | ICD-10-CM | POA: Diagnosis not present

## 2017-11-14 DIAGNOSIS — F1320 Sedative, hypnotic or anxiolytic dependence, uncomplicated: Secondary | ICD-10-CM

## 2017-11-14 MED ORDER — SERTRALINE HCL 25 MG PO TABS: 25.0000 mg | ORAL_TABLET | Freq: Every day | ORAL | 0 refills | Status: DC

## 2017-11-14 NOTE — Patient Instructions (Signed)

## 2017-11-14 NOTE — Progress Notes (Signed)
Subjective:  Patient ID: Christopher Prude., male    DOB: 1950-04-01  Age: 67 y.o. MRN: [id number]  CC: Follow-up (HTN)   HPI Christopher [name]. presents for follow-up of his anxiety.  He did not tolerate the Lexapro well at all.  He tells me that he missed to fishing trips and his gym workouts while he was taking Lexapro.  He had episodes of nervousness and periods of tunnel vision that have all resolved since he discontinued the medicine 5 days ago.  He tells me that he has been on Xanax for 10 years and it is what works for him.  Outpatient Medications Prior to Visit  Medication Sig Dispense Refill  . ALPRAZolam (XANAX) 0.5 MG tablet Take 1 tablet (0.5 mg total) by mouth 2 (two) times daily as needed. 60 tablet 3  . aspirin 81 MG tablet Take 81 mg by mouth daily.      Marland Kitchen atorvastatin (LIPITOR) 40 MG tablet Take 1 tablet (40 mg total) by mouth daily. 90 tablet 4  . cholecalciferol (VITAMIN D) 1000 units tablet Take 2,000 Units by mouth daily.    Marland Kitchen losartan-hydrochlorothiazide (HYZAAR) 100-12.5 MG tablet Take 1 tablet by mouth daily. 90 tablet 3  . Turmeric 500 MG CAPS Take 2 capsules by mouth daily.    Marland Kitchen escitalopram (LEXAPRO) 10 MG tablet Take 1 tablet (10 mg total) by mouth daily. (Patient not taking: Reported on 11/14/2017) 30 tablet 0   No facility-administered medications prior to visit.     ROS Review of Systems  Constitutional: Negative.   Respiratory: Negative.   Cardiovascular: Negative.   Gastrointestinal: Negative.   Psychiatric/Behavioral: The patient is nervous/anxious.     Objective:  BP 118/80 (BP Location: Left Arm, Patient Position: Sitting, Cuff Size: Normal)   Pulse 69   Temp 97.9 F (36.6 C) (Oral)   Ht 6\' 1"  (1.854 m)   Wt 220 lb 6.4 oz (100 kg)   SpO2 95%   BMI 29.08 kg/m   BP Readings from Last 3 Encounters:  11/14/17 118/80  10/14/17 126/80  09/21/17 110/70    Wt Readings from Last 3 Encounters:  11/14/17 220 lb 6.4 oz (100 kg)  10/14/17 218 lb  8 oz (99.1 kg)  09/21/17 216 lb 3.2 oz (98.1 kg)    Physical Exam  Constitutional: He is oriented to person, place, and time. He appears well-developed and well-nourished. No distress.  HENT:  Head: Normocephalic and atraumatic.  Right Ear: External ear normal.  Left Ear: External ear normal.  Eyes: Right eye exhibits no discharge. Left eye exhibits no discharge. No scleral icterus.  Pulmonary/Chest: Effort normal.  Neurological: He is alert and oriented to person, place, and time.  Skin: He is not diaphoretic.  Psychiatric: He has a normal mood and affect. His behavior is normal.    Lab Results  Component Value Date   WBC 6.8 01/05/2017   HGB 15.0 01/05/2017   HCT 44.4 01/05/2017   PLT 163.0 01/05/2017   GLUCOSE 96 01/05/2017   CHOL 138 01/05/2017   TRIG 56.0 01/05/2017   HDL 60.60 01/05/2017   LDLCALC 67 01/05/2017   ALT 22 01/05/2017   AST 22 01/05/2017   NA 136 01/05/2017   K 4.4 01/05/2017   CL 102 01/05/2017   CREATININE 0.83 01/05/2017   BUN 16 01/05/2017   CO2 29 01/05/2017   TSH 1.29 01/05/2017   PSA 0.00 (L) 01/05/2017    Dg Knee Bilateral Standing Ap  Result Date: 04/07/2016 CLINICAL DATA:  Knee pain.  No known injury.  Initial evaluation . EXAM: BILATERAL KNEES STANDING - 1 VIEW COMPARISON:  No recent prior . FINDINGS: No acute bony or joint abnormality identified. No evidence fracture or dislocation mild bilateral medial compartment degenerative change . IMPRESSION: Mild bilateral medial compartment degenerative change. Electronically Signed   By: Marcello Moores  Register   On: 04/07/2016 16:46    Assessment & Plan:   Cash[place] was seen today for follow-up.  Diagnoses and all orders for this visit:  Benzodiazepine dependence ([place])  Anxiety -     sertraline (ZOLOFT) 25 MG tablet; Take 1 tablet (25 mg total) by mouth daily.   I am having Christopher [name]. start on sertraline. I am also having him maintain his aspirin, Turmeric, cholecalciferol, ALPRAZolam,  atorvastatin, losartan-hydrochlorothiazide, and escitalopram.  Meds ordered this encounter  Medications  . sertraline (ZOLOFT) 25 MG tablet    Sig: Take 1 tablet (25 mg total) by mouth daily.    Dispense:  30 tablet    Refill:  0   Patient agreed to try low-dose Zoloft.  He will continue his Xanax.  He will follow-up in 1 month to know if he has problems with the Zoloft in the meantime.  Right now my goal for him just to use the Xanax at nighttime.  We will see how he does with the Zoloft.  Follow-up: Return in about 1 month (around 12/14/2017).  Libby Maw, MD

## 2017-11-16 ENCOUNTER — Other Ambulatory Visit: Payer: Self-pay | Admitting: Family Medicine

## 2017-11-16 DIAGNOSIS — F419 Anxiety disorder, unspecified: Secondary | ICD-10-CM

## 2017-11-16 NOTE — Telephone Encounter (Signed)
WK-Plz see refill req/thx dmf °

## 2017-11-17 NOTE — Telephone Encounter (Signed)
Should not mix this drug with xanax.

## 2017-11-21 ENCOUNTER — Ambulatory Visit: Payer: Medicare HMO | Admitting: Family Medicine

## 2017-11-21 ENCOUNTER — Encounter: Payer: Self-pay | Admitting: Family Medicine

## 2017-11-21 DIAGNOSIS — M170 Bilateral primary osteoarthritis of knee: Secondary | ICD-10-CM | POA: Insufficient documentation

## 2017-11-21 NOTE — Progress Notes (Signed)
[name] [name], [name] [address] Phone: [phone] Subjective:    I Kandace Blitz am serving as a Education administrator for Dr. Hulan Saas.     CC: Bilateral knee pain  OZH:[id number]  Burgess Sheriff. is a 67 y.o. male coming in with complaint of bilateral knee pain. States that his knees are sore. Recently moved and was doing a lot of lifting.  Patient has known arthritic changes of the knees.  Last injections were no greater than 9 months ago.  Patient since increasing instability and swelling noted.  Discussed icing regimen.      Past Medical History:  Diagnosis Date  . BENIGN PROSTATIC HYPERTROPHY 07/28/2006  . GERD 07/28/2006  . HYPERLIPIDEMIA 07/28/2006  . NEOPLASM, MALIGNANT, PROSTATE 08/01/2007  . NISSEN FUNDOPLICATION, HX OF 2/95/2841  . Unspecified essential hypertension 03/27/2007   Past Surgical History:  Procedure Laterality Date  . NISSEN FUNDOPLICATION  3244  . PROSTATECTOMY  2009  . TONSILLECTOMY     Social History   Socioeconomic History  . Marital status: Married    Spouse name: Not on file  . Number of children: Not on file  . Years of education: Not on file  . Highest education level: Not on file  Occupational History  . Not on file  Social Needs  . Financial resource strain: Not on file  . Food insecurity:    Worry: Not on file    Inability: Not on file  . Transportation needs:    Medical: Not on file    Non-medical: Not on file  Tobacco Use  . Smoking status: Former Research scientist (life sciences)  . Smokeless tobacco: Never Used  . Tobacco comment: smoked in college  Substance and Sexual Activity  . Alcohol use: Yes    Alcohol/week: 0.0 standard drinks    Comment: rarely; once or twice yearly  . Drug use: No  . Sexual activity: Not on file  Lifestyle  . Physical activity:    Days per week: Not on file    Minutes per session: Not on file  . Stress: Not on file  Relationships  . Social connections:    Talks on phone:  Not on file    Gets together: Not on file    Attends religious service: Not on file    Active member of club or organization: Not on file    Attends meetings of clubs or organizations: Not on file    Relationship status: Not on file  Other Topics Concern  . Not on file  Social History Narrative  . Not on file   No Known Allergies Family History  Problem Relation Age of Onset  . Heart disease Mother   . Prostate cancer Father        prostate  . Colon cancer Neg Hx      Current Outpatient Medications (Cardiovascular):  .  atorvastatin (LIPITOR) 40 MG tablet, Take 1 tablet (40 mg total) by mouth daily. Marland Kitchen  losartan-hydrochlorothiazide (HYZAAR) 100-12.5 MG tablet, Take 1 tablet by mouth daily.   Current Outpatient Medications (Analgesics):  .  aspirin 81 MG tablet, Take 81 mg by mouth daily.     Current Outpatient Medications (Other):  Marland Kitchen  ALPRAZolam (XANAX) 0.5 MG tablet, Take 1 tablet (0.5 mg total) by mouth 2 (two) times daily as needed. .  cholecalciferol (VITAMIN D) 1000 units tablet, Take 2,000 Units by mouth daily. .  Turmeric 500 MG CAPS, Take 2 capsules by mouth daily. Marland Kitchen  escitalopram (LEXAPRO) 10 MG tablet, Take 1 tablet (10 mg total) by mouth daily. (Patient not taking: Reported on 11/14/2017) .  sertraline (ZOLOFT) 25 MG tablet, Take 1 tablet (25 mg total) by mouth daily. (Patient not taking: Reported on 11/21/2017)    Past medical history, social, surgical and family history all reviewed in electronic medical record.  No pertanent information unless stated regarding to the chief complaint.   Review of Systems:  No headache, visual changes, nausea, vomiting, diarrhea, constipation, dizziness, abdominal pain, skin rash, fevers, chills, night sweats, weight loss, swollen lymph nodes, body aches, j chest pain, shortness of breath, mood changes.  Positive muscle aches and joint swelling  Objective  Blood pressure 112/70, pulse 77, height 6\' 1"  (1.854 m), weight 222 lb  (100.7 kg), SpO2 98 %.    General: No apparent distress alert and oriented x3 mood and affect normal, dressed appropriately.  HEENT: Pupils equal, extraocular movements intact  Respiratory: Patient's speak in full sentences and does not appear short of breath  Cardiovascular: No lower extremity edema, non tender, no erythema  Skin: Warm dry intact with no signs of infection or rash on extremities or on axial skeleton.  Abdomen: Soft nontender  Neuro: Cranial nerves II through XII are intact, neurovascularly intact in all extremities with 2+ DTRs and 2+ pulses.  Lymph: No lymphadenopathy of posterior or anterior cervical chain or axillae bilaterally.  Gait antalgic MSK:  Non tender with full range of motion and good stability and symmetric strength and tone of shoulders, elbows, wrist, hip, and ankles bilaterally.  Knee: Bilateral valgus deformity noted.  Abnormal thigh to calf ratio.  Tender to palpation over medial and PF joint line.  ROM full in flexion and extension and lower leg rotation. instability with valgus force.  painful patellar compression. Patellar glide with moderate crepitus. Patellar and quadriceps tendons unremarkable. Hamstring and quadriceps strength is normal.  After informed written and verbal consent, patient was seated on exam table. Right knee was prepped with alcohol swab and utilizing anterolateral approach, patient's right knee space was injected with 4:1  marcaine 0.5%: Kenalog 40mg /dL. Patient tolerated the procedure well without immediate complications.  After informed written and verbal consent, patient was seated on exam table. Left knee was prepped with alcohol swab and utilizing anterolateral approach, patient's left knee space was injected with 4:1  marcaine 0.5%: Kenalog 40mg /dL. Patient tolerated the procedure well without immediate complications.   Impression and Recommendations:     This case required medical decision making of moderate  complexity. The above documentation has been reviewed and is accurate and complete Lyndal Pulley, DO       Note: This dictation was prepared with Dragon dictation along with smaller phrase technology. Any transcriptional errors that result from this process are unintentional.

## 2017-11-21 NOTE — Assessment & Plan Note (Addendum)
Bilateral knee injections given November 21, 2017.  Discussed icing regimen and home exercise.  Discussed which activities of doing which wants to avoid.  Discussed which activities including stability.  Discussed that a patellar strap if needed.  Follow-up again in 4 to 8 weeks could be a candidate for Visco supplementation.

## 2017-11-21 NOTE — Patient Instructions (Signed)
Good to see you  Ice is your friend Ice 20 minutes 2 times daily. Usually after activity and before bed.  Hope you get the meds figured out.  Patella strap over the counter with a lot of activity may help some See me again in 4-6 weeks if not a lot better

## 2017-11-23 ENCOUNTER — Telehealth: Payer: Self-pay

## 2017-11-23 NOTE — Telephone Encounter (Signed)
Copied from [place] [id number]. Topic: General - Other >> Nov 23, 2017 10:09 AM Carolyn Stare wrote:   Pt said he is no longer going to take  sertraline (ZOLOFT) 25 MG tablet he had non stop panic attack felt like he was going to jump out of his skin, pt did speak with a nurse over the weekend and she helped him get through it  ,he realize he can't ZOLOFT

## 2017-12-01 ENCOUNTER — Ambulatory Visit: Payer: Self-pay

## 2017-12-01 DIAGNOSIS — F419 Anxiety disorder, unspecified: Secondary | ICD-10-CM

## 2017-12-01 DIAGNOSIS — G4700 Insomnia, unspecified: Secondary | ICD-10-CM

## 2017-12-01 NOTE — Telephone Encounter (Signed)
DW patient. Okayed trial of benadryl for sleep. He will let me know. Suggested possible psych consult.

## 2017-12-01 NOTE — Addendum Note (Signed)
Addended by: Jon Billings on: 12/01/2017 11:31 AM   Modules accepted: Orders

## 2017-12-01 NOTE — Telephone Encounter (Signed)
Pt. Called to report he has had poor sleep pattern for past 6-7 weeks; only able to sleep 3-3.5 hrs./ night.  Stated this started with change in medication on 10/16/17.  Reported he was prescribed Paroxetine, then Escitalopram, and then finally Zoloft.  He is no longer taking any of the above medications.  Reported he resumed the Xanax that he has been on for years; he takes Xanax 0.5 mg, 1/2 tab in the morning, and 1 tab in the evening.  Stated that he felt fine prior to starting the different antidepressants, and since August 11th, has not been able to get good sleep.  Reported this is affecting his daily functioning.  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  is requesting recommendation for a mild sleeping medication, that would be safe. Advised will send note to Dr. Ethelene Hal.  Pt. agrees with plan.     Reason for Disposition . Caller requesting a NON-URGENT new prescription or refill and triager unable to refill per unit policy  Answer Assessment - Initial Assessment Questions 1. SYMPTOMS: "Do you have any symptoms?"     Cannot sleep more than 3-3.5 hrs/ night; tried Paroxetine, Escitalopram, and Zoloft at different times, and the sleep pattern has not improved 2. SEVERITY: If symptoms are present, ask "Are they mild, moderate or severe?"     Severe sleep issues for past 5-6 weeks.  Protocols used: MEDICATION QUESTION CALL-A-AH

## 2017-12-01 NOTE — Addendum Note (Signed)
Addended by: Kateri Mc E on: 12/01/2017 04:12 PM   Modules accepted: Orders

## 2017-12-01 NOTE — Telephone Encounter (Signed)
The zoloft, paxil and celexa are long since out of his system and would not have lasting effect. I will refer him to psychiatry for assistance with this matter.

## 2017-12-15 ENCOUNTER — Ambulatory Visit (INDEPENDENT_AMBULATORY_CARE_PROVIDER_SITE_OTHER): Payer: Medicare HMO | Admitting: Family Medicine

## 2017-12-15 ENCOUNTER — Encounter: Payer: Self-pay | Admitting: Family Medicine

## 2017-12-15 VITALS — BP 128/80 | HR 84 | Ht 73.0 in

## 2017-12-15 DIAGNOSIS — F1320 Sedative, hypnotic or anxiolytic dependence, uncomplicated: Secondary | ICD-10-CM

## 2017-12-15 DIAGNOSIS — Z23 Encounter for immunization: Secondary | ICD-10-CM | POA: Diagnosis not present

## 2017-12-15 DIAGNOSIS — R69 Illness, unspecified: Secondary | ICD-10-CM | POA: Diagnosis not present

## 2017-12-15 DIAGNOSIS — F419 Anxiety disorder, unspecified: Secondary | ICD-10-CM

## 2017-12-15 DIAGNOSIS — T50905S Adverse effect of unspecified drugs, medicaments and biological substances, sequela: Secondary | ICD-10-CM | POA: Insufficient documentation

## 2017-12-15 NOTE — Progress Notes (Addendum)
Subjective:  Patient ID: Christopher Prude., male    DOB: Nov 07, 1950  Age: 67 y.o. MRN: [id number]  CC: Follow-up   HPI Christopher Bynum. presents for follow-up of his anxiety.  We have tried multiple SSRIs and he has been intolerant of them all. Patient reports panic attacks while taking SSRIs and anxiety as well. He said that he missed outings with friends because of the panic attacks.   He is taking the Xanax for long period of time and has not had problems with the drug.  He continues to go to the gym 3 to 5 days a week and enjoys working out.  He is currently taking an aspirin low-dose daily but is never had a heart attack or stroke and has no documented vascular disease.  He has been on atorvastatin for years for his cholesterol.  His blood pressure is been well controlled with Hyzaar.  His wife is having a difficult time she was recently diagnosed with her second DVT after Labor Day but has been tolerating her Xarelto well.  Outpatient Medications Prior to Visit  Medication Sig Dispense Refill  . ALPRAZolam (XANAX) 0.5 MG tablet Take 1 tablet (0.5 mg total) by mouth 2 (two) times daily as needed. 60 tablet 3  . atorvastatin (LIPITOR) 40 MG tablet Take 1 tablet (40 mg total) by mouth daily. 90 tablet 4  . cholecalciferol (VITAMIN D) 1000 units tablet Take 2,000 Units by mouth daily.    Marland Kitchen losartan-hydrochlorothiazide (HYZAAR) 100-12.5 MG tablet Take 1 tablet by mouth daily. 90 tablet 3  . Turmeric 500 MG CAPS Take 2 capsules by mouth daily.    Marland Kitchen aspirin 81 MG tablet Take 81 mg by mouth daily.      Marland Kitchen escitalopram (LEXAPRO) 10 MG tablet Take 1 tablet (10 mg total) by mouth daily. (Patient not taking: Reported on 11/14/2017) 30 tablet 0  . sertraline (ZOLOFT) 25 MG tablet Take 1 tablet (25 mg total) by mouth daily. (Patient not taking: Reported on 11/21/2017) 30 tablet 0   No facility-administered medications prior to visit.     ROS Review of Systems  Constitutional: Negative.   HENT:  Negative.   Eyes: Negative.   Respiratory: Negative.   Cardiovascular: Negative.   Gastrointestinal: Negative.   Psychiatric/Behavioral: Negative.     Objective:  BP 128/80   Pulse 84   Ht 6\' 1"  (1.854 m)   SpO2 97%   BMI 29.29 kg/m   BP Readings from Last 3 Encounters:  12/15/17 128/80  11/21/17 112/70  11/14/17 118/80    Wt Readings from Last 3 Encounters:  11/21/17 222 lb (100.7 kg)  11/14/17 220 lb 6.4 oz (100 kg)  10/14/17 218 lb 8 oz (99.1 kg)    Physical Exam  Constitutional: He is oriented to person, place, and time. He appears well-developed and well-nourished. No distress.  HENT:  Head: Normocephalic and atraumatic.  Right Ear: External ear normal.  Left Ear: External ear normal.  Eyes: Right eye exhibits no discharge. Left eye exhibits no discharge. No scleral icterus.  Pulmonary/Chest: Effort normal.  Neurological: He is alert and oriented to person, place, and time.  Skin: He is not diaphoretic.  Psychiatric: He has a normal mood and affect. His behavior is normal.    Lab Results  Component Value Date   WBC 6.8 01/05/2017   HGB 15.0 01/05/2017   HCT 44.4 01/05/2017   PLT 163.0 01/05/2017   GLUCOSE 96 01/05/2017   CHOL 138 01/05/2017  TRIG 56.0 01/05/2017   HDL 60.60 01/05/2017   LDLCALC 67 01/05/2017   ALT 22 01/05/2017   AST 22 01/05/2017   NA 136 01/05/2017   K 4.4 01/05/2017   CL 102 01/05/2017   CREATININE 0.83 01/05/2017   BUN 16 01/05/2017   CO2 29 01/05/2017   TSH 1.29 01/05/2017   PSA 0.00 (L) 01/05/2017    Dg Knee Bilateral Standing Ap  Result Date: 04/07/2016 CLINICAL DATA:  Knee pain.  No known injury.  Initial evaluation . EXAM: BILATERAL KNEES STANDING - 1 VIEW COMPARISON:  No recent prior . FINDINGS: No acute bony or joint abnormality identified. No evidence fracture or dislocation mild bilateral medial compartment degenerative change . IMPRESSION: Mild bilateral medial compartment degenerative change. Electronically Signed    By: Marcello Moores  Register   On: 04/07/2016 16:46    Assessment & Plan:   Zayvian was seen today for follow-up.  Diagnoses and all orders for this visit:  Medication side effect, sequela  Need for influenza vaccination -     Flu vaccine HIGH DOSE PF  Anxiety  Benzodiazepine dependence ([place])   I have discontinued Christopher B. Warwick Jr.'s aspirin, escitalopram, and sertraline. I am also having him maintain his Turmeric, cholecalciferol, ALPRAZolam, atorvastatin, and losartan-hydrochlorothiazide.  No orders of the defined types were placed in this encounter.    Follow-up: Return if symptoms worsen or fail to improve.  Libby Maw, MD

## 2017-12-15 NOTE — Addendum Note (Signed)
Addended by: Kateri Mc E on: 12/15/2017 01:34 PM   Modules accepted: Orders

## 2017-12-29 ENCOUNTER — Ambulatory Visit: Payer: Medicare HMO | Admitting: Family Medicine

## 2018-01-26 DIAGNOSIS — H524 Presbyopia: Secondary | ICD-10-CM | POA: Diagnosis not present

## 2018-01-26 DIAGNOSIS — D3131 Benign neoplasm of right choroid: Secondary | ICD-10-CM | POA: Diagnosis not present

## 2018-02-24 ENCOUNTER — Other Ambulatory Visit: Payer: Self-pay | Admitting: Family Medicine

## 2018-02-24 MED ORDER — ALPRAZOLAM 0.5 MG PO TABS: 0.5000 mg | ORAL_TABLET | Freq: Two times a day (BID) | ORAL | 3 refills | Status: DC | PRN

## 2018-02-24 NOTE — Addendum Note (Signed)
Addended by: Kateri Mc E on: 02/24/2018 03:02 PM   Modules accepted: Orders

## 2018-02-24 NOTE — Telephone Encounter (Signed)
Copied from [place] [id number]. Topic: Quick Communication - Rx Refill/Question >> Feb 24, 2018  2:45 PM Rayann Heman wrote: Medication:ALPRAZolam Duanne Moron) 0.5 MG tablet [[id number]]   Has the patient contacted their pharmacy? Yes Preferred Pharmacy (with phone number or street name):[id number] - [place], [place] - [address] [phone] (Phone) [phone] (Fax)    Agent: Please be advised that RX refills may take up to 3 business days. We ask that you follow-up with your pharmacy.

## 2018-02-24 NOTE — Telephone Encounter (Signed)
Requested medication (s) are due for refill today:yes  Requested medication (s) are on the active medication list: yes  Last refill: 7/01/12/18  Future visit scheduled:no  Notes to clinic:  Not delegated

## 2018-04-18 ENCOUNTER — Ambulatory Visit: Payer: Medicare HMO | Admitting: Family Medicine

## 2018-04-18 ENCOUNTER — Encounter: Payer: Self-pay | Admitting: Family Medicine

## 2018-04-18 DIAGNOSIS — M170 Bilateral primary osteoarthritis of knee: Secondary | ICD-10-CM

## 2018-04-18 NOTE — Patient Instructions (Signed)
Good to see you  Ice is your friend Can repeat injections every 3 months if needed See me again in 3 months

## 2018-04-18 NOTE — Progress Notes (Signed)
[name] [address] [place], [place] [address] Phone: [phone] Subjective:    I Christopher [name] am serving as a Education administrator for Dr. Hulan Saas.    CC: knee pain   HRC:[id number]    11/21/2017  Bilateral knee injections given November 21, 2017.  Discussed icing regimen and home exercise.  Discussed which activities of doing which wants to avoid.  Discussed which activities including stability.  Discussed that a patellar strap if needed.  Follow-up again in 4 to 8 weeks could be a candidate for Visco supplementation.   Updated 04/18/2018  Christopher [name]. is a 68 y.o. male coming in with complaint of knee pain follow-up.  Patient was last seen in September.  Given injections at that time. States that they are sore all the time. States his last injection took longer to take and that it was very temporary. Would like an injection.  Patient has known knee arthritis.  Has had difficulty because of being the primary caregiver for her his ailing wife.      Past Medical History:  Diagnosis Date  . BENIGN PROSTATIC HYPERTROPHY 07/28/2006  . GERD 07/28/2006  . HYPERLIPIDEMIA 07/28/2006  . NEOPLASM, MALIGNANT, PROSTATE 08/01/2007  . NISSEN FUNDOPLICATION, HX OF 10/08/2120  . Unspecified essential hypertension 03/27/2007   Past Surgical History:  Procedure Laterality Date  . NISSEN FUNDOPLICATION  4825  . PROSTATECTOMY  2009  . TONSILLECTOMY     Social History   Socioeconomic History  . Marital status: Married    Spouse name: Not on file  . Number of children: Not on file  . Years of education: Not on file  . Highest education level: Not on file  Occupational History  . Not on file  Social Needs  . Financial resource strain: Not on file  . Food insecurity:    Worry: Not on file    Inability: Not on file  . Transportation needs:    Medical: Not on file    Non-medical: Not on file  Tobacco Use  . Smoking status: Former Research scientist (life sciences)  . Smokeless  tobacco: Never Used  . Tobacco comment: smoked in college  Substance and Sexual Activity  . Alcohol use: Yes    Alcohol/week: 0.0 standard drinks    Comment: rarely; once or twice yearly  . Drug use: No  . Sexual activity: Not on file  Lifestyle  . Physical activity:    Days per week: Not on file    Minutes per session: Not on file  . Stress: Not on file  Relationships  . Social connections:    Talks on phone: Not on file    Gets together: Not on file    Attends religious service: Not on file    Active member of club or organization: Not on file    Attends meetings of clubs or organizations: Not on file    Relationship status: Not on file  Other Topics Concern  . Not on file  Social History Narrative  . Not on file   No Known Allergies Family History  Problem Relation Age of Onset  . Heart disease Mother   . Prostate cancer Father        prostate  . Colon cancer Neg Hx      Current Outpatient Medications (Cardiovascular):  .  atorvastatin (LIPITOR) 40 MG tablet, Take 1 tablet (40 mg total) by mouth daily. Marland Kitchen  losartan-hydrochlorothiazide (HYZAAR) 100-12.5 MG tablet, Take 1 tablet by mouth daily.  Current Outpatient Medications (Other):  Marland Kitchen  ALPRAZolam (XANAX) 0.5 MG tablet, Take 1 tablet (0.5 mg total) by mouth 2 (two) times daily as needed. .  cholecalciferol (VITAMIN D) 1000 units tablet, Take 2,000 Units by mouth daily. .  Turmeric 500 MG CAPS, Take 2 capsules by mouth daily.    Past medical history, social, surgical and family history all reviewed in electronic medical record.  No pertanent information unless stated regarding to the chief complaint.   Review of Systems:  No headache, visual changes, nausea, vomiting, diarrhea, constipation, dizziness, abdominal pain, skin rash, fevers, chills, night sweats, weight loss, swollen lymph nodes, body aches,, , chest pain, shortness of breath, mood changes.  Positive muscle aches, joint swelling  Objective    Blood pressure 120/78, pulse 65, height 6\' 1"  (1.854 m), weight 236 lb (107 kg), SpO2 97 %.    General: No apparent distress alert and oriented x3 mood and affect normal, dressed appropriately.  HEENT: Pupils equal, extraocular movements intact  Respiratory: Patient's speak in full sentences and does not appear short of breath  Cardiovascular: No lower extremity edema, non tender, no erythema  Skin: Warm dry intact with no signs of infection or rash on extremities or on axial skeleton.  Abdomen: Soft nontender  Neuro: Cranial nerves II through XII are intact, neurovascularly intact in all extremities with 2+ DTRs and 2+ pulses.  Lymph: No lymphadenopathy of posterior or anterior cervical chain or axillae bilaterally.  Gait mild antalgic.  MSK:  Non tender with full range of motion and good stability and symmetric strength and tone of shoulders, elbows, wrist, hip and ankles bilaterally.  Knee: Bilateral valgus deformity noted. Large thigh to calf ratio.  Tender to palpation over medial and PF joint line.  ROM full in flexion and extension and lower leg rotation. instability with valgus force.  painful patellar compression. Patellar glide with moderate crepitus. Patellar and quadriceps tendons unremarkable. Hamstring and quadriceps strength is normal.  After informed written and verbal consent, patient was seated on exam table. Right knee was prepped with alcohol swab and utilizing anterolateral approach, patient's right knee space was injected with 4:1  marcaine 0.5%: Kenalog 40mg /dL. Patient tolerated the procedure well without immediate complications.  After informed written and verbal consent, patient was seated on exam table. Left knee was prepped with alcohol swab and utilizing anterolateral approach, patient's left knee space was injected with 4:1  marcaine 0.5%: Kenalog 40mg /dL. Patient tolerated the procedure well without immediate complications.    Impression and  Recommendations:     This case required medical decision making of moderate complexity. The above documentation has been reviewed and is accurate and complete Christopher Pulley, DO       Note: This dictation was prepared with Dragon dictation along with smaller phrase technology. Any transcriptional errors that result from this process are unintentional.

## 2018-04-18 NOTE — Assessment & Plan Note (Signed)
Bilateral injections given today.  Discussed icing regimen and home exercise.  Discussed which activities to do which wants to avoid.  Discussed topical anti-inflammatories.  Discussed the viscosupplementation.  Follow-up again in 3 months.

## 2018-07-17 ENCOUNTER — Ambulatory Visit: Payer: Medicare HMO | Admitting: Family Medicine

## 2018-07-22 ENCOUNTER — Other Ambulatory Visit: Payer: Self-pay | Admitting: Family Medicine

## 2018-08-03 ENCOUNTER — Encounter: Payer: Self-pay | Admitting: Family Medicine

## 2018-08-03 ENCOUNTER — Ambulatory Visit (INDEPENDENT_AMBULATORY_CARE_PROVIDER_SITE_OTHER): Payer: Medicare HMO | Admitting: Family Medicine

## 2018-08-03 VITALS — BP 130/70 | HR 83 | Temp 97.8°F | Ht 73.0 in | Wt 231.2 lb

## 2018-08-03 DIAGNOSIS — R69 Illness, unspecified: Secondary | ICD-10-CM | POA: Diagnosis not present

## 2018-08-03 DIAGNOSIS — S301XXA Contusion of abdominal wall, initial encounter: Secondary | ICD-10-CM

## 2018-08-03 DIAGNOSIS — F419 Anxiety disorder, unspecified: Secondary | ICD-10-CM | POA: Diagnosis not present

## 2018-08-03 DIAGNOSIS — F1320 Sedative, hypnotic or anxiolytic dependence, uncomplicated: Secondary | ICD-10-CM | POA: Diagnosis not present

## 2018-08-03 NOTE — Progress Notes (Signed)
Established Patient Office Visit  Subjective:  Patient ID: Christopher Sherk., male    DOB: [birth year]  Age: 68 y.o. MRN: [id number]  CC:  Chief Complaint  Patient presents with  . bruising below navel    HPI Christopher Prude. presents for follow-up of his anxiety and evaluation of a bruise that appeared to develop spontaneously a few days located just below his navel.  Denies specific abdominal trauma.  He has gained weight since his last office visit.  Admits to increased anti-inflammatory use for a dental issue he had a few weeks ago.  He is no longer taking these medications.  He has not been able to go to the gym and exercise.  Continues to use Xanax on a as needed basis for anxiety.  He did not tolerate other treatment for his anxiety.  He is benzodiazepine dependent.  The drug works well for him and he request to continue it on a as needed basis.  Past Medical History:  Diagnosis Date  . BENIGN PROSTATIC HYPERTROPHY 07/28/2006  . GERD 07/28/2006  . HYPERLIPIDEMIA 07/28/2006  . NEOPLASM, MALIGNANT, PROSTATE 08/01/2007  . NISSEN FUNDOPLICATION, HX OF 06/08/4740  . Unspecified essential hypertension 03/27/2007    Past Surgical History:  Procedure Laterality Date  . NISSEN FUNDOPLICATION  5956  . PROSTATECTOMY  2009  . TONSILLECTOMY      Family History  Problem Relation Age of Onset  . Heart disease Mother   . Prostate cancer Father        prostate  . Colon cancer Neg Hx     Social History   Socioeconomic History  . Marital status: Married    Spouse name: Not on file  . Number of children: Not on file  . Years of education: Not on file  . Highest education level: Not on file  Occupational History  . Not on file  Social Needs  . Financial resource strain: Not on file  . Food insecurity:    Worry: Not on file    Inability: Not on file  . Transportation needs:    Medical: Not on file    Non-medical: Not on file  Tobacco Use  . Smoking status: Former Research scientist (life sciences)  .  Smokeless tobacco: Never Used  . Tobacco comment: smoked in college  Substance and Sexual Activity  . Alcohol use: Yes    Alcohol/week: 0.0 standard drinks    Comment: rarely; once or twice yearly  . Drug use: No  . Sexual activity: Not on file  Lifestyle  . Physical activity:    Days per week: Not on file    Minutes per session: Not on file  . Stress: Not on file  Relationships  . Social connections:    Talks on phone: Not on file    Gets together: Not on file    Attends religious service: Not on file    Active member of club or organization: Not on file    Attends meetings of clubs or organizations: Not on file    Relationship status: Not on file  . Intimate partner violence:    Fear of current or ex partner: Not on file    Emotionally abused: Not on file    Physically abused: Not on file    Forced sexual activity: Not on file  Other Topics Concern  . Not on file  Social History Narrative  . Not on file    Outpatient Medications Prior to Visit  Medication Sig Dispense  Refill  . ALPRAZolam (XANAX) 0.5 MG tablet TAKE 1 TABLET (0.5 MG TOTAL) BY MOUTH 2 (TWO) TIMES DAILY AS NEEDED. 60 tablet 3  . atorvastatin (LIPITOR) 40 MG tablet Take 1 tablet (40 mg total) by mouth daily. 90 tablet 4  . cholecalciferol (VITAMIN D) 1000 units tablet Take 2,000 Units by mouth daily.    Marland Kitchen losartan-hydrochlorothiazide (HYZAAR) 100-12.5 MG tablet Take 1 tablet by mouth daily. 90 tablet 3  . Turmeric 500 MG CAPS Take 2 capsules by mouth daily.     No facility-administered medications prior to visit.     No Known Allergies  ROS Review of Systems  Constitutional: Negative for chills, diaphoresis, fatigue, fever and unexpected weight change.  HENT: Negative.   Eyes: Negative for photophobia and visual disturbance.  Respiratory: Negative.   Cardiovascular: Negative.   Gastrointestinal: Negative.   Skin: Positive for color change. Negative for wound.  Psychiatric/Behavioral: Negative for  dysphoric mood, self-injury and suicidal ideas. The patient is nervous/anxious.       Objective:    Physical Exam  Constitutional: He is oriented to person, place, and time. He appears well-developed and well-nourished. No distress.  HENT:  Head: Normocephalic and atraumatic.  Right Ear: External ear normal.  Left Ear: External ear normal.  Eyes: Conjunctivae are normal. Right eye exhibits no discharge. Left eye exhibits no discharge. No scleral icterus.  Neck: No JVD present. No tracheal deviation present.  Cardiovascular: Normal rate, regular rhythm and normal heart sounds.  Pulmonary/Chest: Effort normal. No stridor.  Abdominal: He exhibits no distension. There is no abdominal tenderness. There is no rebound and no guarding. A hernia is present. Hernia confirmed positive in the ventral area.    Neurological: He is alert and oriented to person, place, and time.  Skin: Skin is warm and dry. He is not diaphoretic.     Psychiatric: He has a normal mood and affect. His behavior is normal.    BP 130/70   Pulse 83   Temp 97.8 F (36.6 C) (Oral)   Ht 6\' 1"  (1.854 m)   Wt 231 lb 4 oz (104.9 kg)   SpO2 97%   BMI 30.51 kg/m  Wt Readings from Last 3 Encounters:  08/03/18 231 lb 4 oz (104.9 kg)  04/18/18 236 lb (107 kg)  11/21/17 222 lb (100.7 kg)   BP Readings from Last 3 Encounters:  08/03/18 130/70  04/18/18 120/78  12/15/17 128/80   Guideline developer:  UpToDate (see UpToDate for funding source) Date Released: June 2014  Health Maintenance Due  Topic Date Due  . Hepatitis C Screening  1950-07-15  . PNA vac Low Risk Adult (2 of 2 - PPSV23) 12/22/2016    There are no preventive care reminders to display for this patient.  Lab Results  Component Value Date   TSH 1.29 01/05/2017   Lab Results  Component Value Date   WBC 6.8 01/05/2017   HGB 15.0 01/05/2017   HCT 44.4 01/05/2017   MCV 91.5 01/05/2017   PLT 163.0 01/05/2017   Lab Results  Component Value Date    NA 136 01/05/2017   K 4.4 01/05/2017   CO2 29 01/05/2017   GLUCOSE 96 01/05/2017   BUN 16 01/05/2017   CREATININE 0.83 01/05/2017   BILITOT 0.6 01/05/2017   ALKPHOS 80 01/05/2017   AST 22 01/05/2017   ALT 22 01/05/2017   PROT 6.2 01/05/2017   ALBUMIN 4.3 01/05/2017   CALCIUM 9.4 01/05/2017   GFR 98.38 01/05/2017  Lab Results  Component Value Date   CHOL 138 01/05/2017   Lab Results  Component Value Date   HDL 60.60 01/05/2017   Lab Results  Component Value Date   LDLCALC 67 01/05/2017   Lab Results  Component Value Date   TRIG 56.0 01/05/2017   Lab Results  Component Value Date   CHOLHDL 2 01/05/2017   No results found for: HGBA1C    Assessment & Plan:   Problem List Items Addressed This Visit      Other   Anxiety - Primary   Benzodiazepine dependence ([place])   Traumatic ecchymosis of abdominal wall      No orders of the defined types were placed in this encounter.   Follow-up: Return follow up for blood pressure and cholesterol. .   Reassured patient that his ecchymosis is more than likely benign.  Probably associated with his increased weight and recent nonsteroidal use.  He is due for follow-up of his hypertension and elevated cholesterol we will recheck it again at that time.

## 2018-08-03 NOTE — Patient Instructions (Signed)
Contusion    A contusion is a deep bruise. Contusions happen when an injury causes bleeding under the skin. Symptoms of bruising include pain, swelling, and discolored skin. The skin may turn blue, purple, or yellow.  Follow these instructions at home:   Rest the injured area.   If told, put ice on the injured area.  ? Put ice in a plastic bag.  ? Place a towel between your skin and the bag.  ? Leave the ice on for 20 minutes, 2-3 times per day.   If told, put light pressure (compression) on the injured area using an elastic bandage. Make sure the bandage is not too tight. Remove it and put it back on as told by your doctor.   If possible, raise (elevate) the injured area above the level of your heart while you are sitting or lying down.   Take over-the-counter and prescription medicines only as told by your doctor.  Contact a doctor if:   Your symptoms do not get better after several days of treatment.   Your symptoms get worse.   You have trouble moving the injured area.  Get help right away if:   You have very bad pain.   You have a loss of feeling (numbness) in a hand or foot.   Your hand or foot turns pale or cold.  This information is not intended to replace advice given to you by your health care provider. Make sure you discuss any questions you have with your health care provider.  Document Released: 08/11/2007 Document Revised: 07/31/2015 Document Reviewed: 07/10/2014  Elsevier Interactive Patient Education  2019 Elsevier Inc.

## 2018-08-22 NOTE — Progress Notes (Signed)
[name] [address], [place] [address] Phone: [phone] Subjective:   Fontaine No, am serving as a scribe for Dr. Hulan Saas.   CC: Bilateral knee pain  QQI:[id number]   04/18/2018: Bilateral injections given today.  Discussed icing regimen and home exercise.  Discussed which activities to do which wants to avoid.  Discussed topical anti-inflammatories.  Discussed the viscosupplementation.  Follow-up again in 3 months.  Update 08/23/2018: Christopher [name]. is a 68 y.o. male coming in with complaint of bilateral knee pain. Patient states that he has pain in patella's bilaterally. Squatting is what bothers him. Does feel that the injections.  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      Past Medical History:  Diagnosis Date  . BENIGN PROSTATIC HYPERTROPHY 07/28/2006  . GERD 07/28/2006  . HYPERLIPIDEMIA 07/28/2006  . NEOPLASM, MALIGNANT, PROSTATE 08/01/2007  . NISSEN FUNDOPLICATION, HX OF 06/23/4079  . Unspecified essential hypertension 03/27/2007   Past Surgical History:  Procedure Laterality Date  . NISSEN FUNDOPLICATION  4481  . PROSTATECTOMY  2009  . TONSILLECTOMY     Social History   Socioeconomic History  . Marital status: Married    Spouse name: Not on file  . Number of children: Not on file  . Years of education: Not on file  . Highest education level: Not on file  Occupational History  . Not on file  Social Needs  . Financial resource strain: Not on file  . Food insecurity    Worry: Not on file    Inability: Not on file  . Transportation needs    Medical: Not on file    Non-medical: Not on file  Tobacco Use  . Smoking status: Former Research scientist (life sciences)  . Smokeless tobacco: Never Used  . Tobacco comment: smoked in college  Substance and Sexual Activity  . Alcohol use: Yes    Alcohol/week: 0.0 standard drinks    Comment: rarely; once or twice yearly  . Drug use: No  . Sexual  activity: Not on file  Lifestyle  . Physical activity    Days per week: Not on file    Minutes per session: Not on file  . Stress: Not on file  Relationships  . Social Herbalist on phone: Not on file    Gets together: Not on file    Attends religious service: Not on file    Active member of club or organization: Not on file    Attends meetings of clubs or organizations: Not on file    Relationship status: Not on file  Other Topics Concern  . Not on file  Social History Narrative  . Not on file   No Known Allergies Family History  Problem Relation Age of Onset  . Heart disease Mother   . Prostate cancer Father        prostate  . Colon cancer Neg Hx      Current Outpatient Medications (Cardiovascular):  .  atorvastatin (LIPITOR) 40 MG tablet, Take 1 tablet (40 mg total) by mouth daily. Marland Kitchen  losartan-hydrochlorothiazide (HYZAAR) 100-12.5 MG tablet, Take 1 tablet by mouth daily.     Current Outpatient Medications (Other):  Marland Kitchen  ALPRAZolam (XANAX) 0.5 MG tablet, TAKE 1 TABLET (0.5 MG TOTAL) BY MOUTH 2 (TWO) TIMES DAILY AS NEEDED. .  cholecalciferol (VITAMIN D) 1000 units tablet, Take 2,000 Units by mouth daily. .  Turmeric 500 MG CAPS, Take 2 capsules  by mouth daily.    Past medical history, social, surgical and family history all reviewed in electronic medical record.  No pertanent information unless stated regarding to the chief complaint.   Review of Systems:  No headache, visual changes, nausea, vomiting, diarrhea, constipation, dizziness, abdominal pain, skin rash, fevers, chills, night sweats, weight loss, swollen lymph nodes, body aches,chest pain, shortness of breath, mood changes.  Positive muscle aches and joint swelling  Objective  Blood pressure 110/68, pulse 80, height 6\' 1"  (1.854 m), weight 270 lb (122.5 kg), SpO2 97 %.    General: No apparent distress alert and oriented x3 mood and affect normal, dressed appropriately.  HEENT: Pupils equal,  extraocular movements intact  Respiratory: Patient's speak in full sentences and does not appear short of breath  Cardiovascular: No lower extremity edema, non tender, no erythema  Skin: Warm dry intact with no signs of infection or rash on extremities or on axial skeleton.  Abdomen: Soft nontender  Neuro: Cranial nerves II through XII are intact, neurovascularly intact in all extremities with 2+ DTRs and 2+ pulses.  Lymph: No lymphadenopathy of posterior or anterior cervical chain or axillae bilaterally.  Gait antalgic MSK:  tender with full range of motion and good stability and symmetric strength and tone of shoulders, elbows, wrist, hip, and ankles bilaterally.  Arthritic changes of multiple joints Knee: Bilateral valgus deformity noted.  Abnormal thigh to calf ratio.  Tender to palpation over medial and PF joint line.  ROM full in flexion and extension and lower leg rotation. instability with valgus force.  painful patellar compression. Patellar glide with moderate crepitus. Patellar and quadriceps tendons unremarkable. Hamstring and quadriceps strength is normal.  After informed written and verbal consent, patient was seated on exam table. Right knee was prepped with alcohol swab and utilizing anterolateral approach, patient's right knee space was injected with 4:1  marcaine 0.5%: Kenalog 40mg /dL. Patient tolerated the procedure well without immediate complications.  After informed written and verbal consent, patient was seated on exam table. Left knee was prepped with alcohol swab and utilizing anterolateral approach, patient's left knee space was injected with 4:1  marcaine 0.5%: Kenalog 40mg /dL. Patient tolerated the procedure well without immediate complications.   Impression and Recommendations:     This case required medical decision making of moderate complexity. The above documentation has been reviewed and is accurate and complete Lyndal Pulley, DO       Note: This  dictation was prepared with Dragon dictation along with smaller phrase technology. Any transcriptional errors that result from this process are unintentional.

## 2018-08-23 ENCOUNTER — Encounter: Payer: Self-pay | Admitting: Family Medicine

## 2018-08-23 ENCOUNTER — Ambulatory Visit: Payer: Medicare HMO | Admitting: Family Medicine

## 2018-08-23 ENCOUNTER — Other Ambulatory Visit: Payer: Self-pay

## 2018-08-23 DIAGNOSIS — M170 Bilateral primary osteoarthritis of knee: Secondary | ICD-10-CM | POA: Diagnosis not present

## 2018-08-23 NOTE — Assessment & Plan Note (Signed)
Patient given injection today.  Tolerated the procedure well.  We discussed icing regimen and home exercise.  Discussed which activities to do which was to avoid.  Patient given topical anti-inflammatories.  Discussed the possibility of Visco supplementation but patient wants to avoid it.  Follow-up with me again in 3 months

## 2018-08-23 NOTE — Patient Instructions (Signed)
See me in 3 months Injections given today

## 2018-08-30 DIAGNOSIS — S60458A Superficial foreign body of other finger, initial encounter: Secondary | ICD-10-CM | POA: Diagnosis not present

## 2018-10-17 ENCOUNTER — Telehealth: Payer: Self-pay

## 2018-10-17 NOTE — Telephone Encounter (Signed)
Called patient to reschedule 9/14 appointment. Wife answered and rescheduled.

## 2018-11-19 NOTE — Progress Notes (Signed)
[name] [address], [place] [address] Phone: [phone] Subjective:   I Christopher [name] am serving as a Education administrator for Dr. Hulan Saas.   CC: Bilateral knee injection  QA:[id number]   08/23/2018 Patient given injection today.  Tolerated the procedure well.  We discussed icing regimen and home exercise.  Discussed which activities to do which was to avoid.  Patient given topical anti-inflammatories.  Discussed the possibility of Visco supplementation but patient wants to avoid it.  Follow-up with me again in 3 months  11/21/2018 Christopher [name]. is a 68 y.o. male coming in with complaint of bilateral knee pain.  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  Last seen 3 months ago and given steroid injections.  Patient states that with certain motions he feels pain but overall the knees do not bother him.       Past Medical History:  Diagnosis Date  . BENIGN PROSTATIC HYPERTROPHY 07/28/2006  . GERD 07/28/2006  . HYPERLIPIDEMIA 07/28/2006  . NEOPLASM, MALIGNANT, PROSTATE 08/01/2007  . NISSEN FUNDOPLICATION, HX OF [id number]  . Unspecified essential hypertension 03/27/2007   Past Surgical History:  Procedure Laterality Date  . NISSEN FUNDOPLICATION  [id number]  . PROSTATECTOMY  2009  . TONSILLECTOMY     Social History   Socioeconomic History  . Marital status: Married    Spouse name: Not on file  . Number of children: Not on file  . Years of education: Not on file  . Highest education level: Not on file  Occupational History  . Not on file  Social Needs  . Financial resource strain: Not on file  . Food insecurity    Worry: Not on file    Inability: Not on file  . Transportation needs    Medical: Not on file    Non-medical: Not on file  Tobacco Use  . Smoking status: Former Research scientist (life sciences)  . Smokeless tobacco: Never Used  . Tobacco comment: smoked in college  Substance and Sexual Activity  . Alcohol use: Yes    Alcohol/week: 0.0  standard drinks    Comment: rarely; once or twice yearly  . Drug use: No  . Sexual activity: Not on file  Lifestyle  . Physical activity    Days per week: Not on file    Minutes per session: Not on file  . Stress: Not on file  Relationships  . Social Herbalist on phone: Not on file    Gets together: Not on file    Attends religious service: Not on file    Active member of club or organization: Not on file    Attends meetings of clubs or organizations: Not on file    Relationship status: Not on file  Other Topics Concern  . Not on file  Social History Narrative  . Not on file   No Known Allergies Family History  Problem Relation Age of Onset  . Heart disease Mother   . Prostate cancer Father        prostate  . Colon cancer Neg Hx      Current Outpatient Medications (Cardiovascular):  .  atorvastatin (LIPITOR) 40 MG tablet, Take 1 tablet (40 mg total) by mouth daily. Marland Kitchen  losartan-hydrochlorothiazide (HYZAAR) 100-12.5 MG tablet, Take 1 tablet by mouth daily.     Current Outpatient Medications (Other):  Marland Kitchen  ALPRAZolam (XANAX) 0.5 MG tablet, TAKE 1 TABLET (0.5 MG TOTAL) BY MOUTH 2 (TWO) TIMES DAILY AS  NEEDED. .  cholecalciferol (VITAMIN D) 1000 units tablet, Take 2,000 Units by mouth daily. .  Turmeric 500 MG CAPS, Take 2 capsules by mouth daily.    Past medical history, social, surgical and family history all reviewed in electronic medical record.  No pertanent information unless stated regarding to the chief complaint.   Review of Systems:  No headache, visual changes, nausea, vomiting, diarrhea, constipation, dizziness, abdominal pain, skin rash, fevers, chills, night sweats, weight loss, swollen lymph nodes, body aches,, chest pain, shortness of breath, mood changes.  Positive muscle aches, joint swelling  Objective  Blood pressure 110/70, pulse 74, height 6\' 1"  (1.854 m), weight 236 lb (107 kg), SpO2 97 %.    General: No apparent distress alert and  oriented x3 mood and affect normal, dressed appropriately.  HEENT: Pupils equal, extraocular movements intact  Respiratory: Patient's speak in full sentences and does not appear short of breath  Cardiovascular: No lower extremity edema, non tender, no erythema  Skin: Warm dry intact with no signs of infection or rash on extremities or on axial skeleton.  Abdomen: Soft nontender  Neuro: Cranial nerves II through XII are intact, neurovascularly intact in all extremities with 2+ DTRs and 2+ pulses.  Lymph: No lymphadenopathy of posterior or anterior cervical chain or axillae bilaterally.  Gait normal with good balance and coordination.  MSK:  Non tender with full range of motion and good stability and symmetric strength and tone of shoulders, elbows, wrist, hip and ankles bilaterally.  Knee: Bilateral valgus deformity noted.  Abnormal thigh to calf ratio.  Tender to palpation over medial and PF joint line.  ROM full in flexion and extension and lower leg rotation. instability with valgus force.  painful patellar compression. Patellar glide with moderate crepitus. Patellar and quadriceps tendons unremarkable. Hamstring and quadriceps strength is normal.   After informed written and verbal consent, patient was seated on exam table. Right knee was prepped with alcohol swab and utilizing anterolateral approach, patient's right knee space was injected with 4:1  marcaine 0.5%: Kenalog 40mg /dL. Patient tolerated the procedure well without immediate complications.  After informed written and verbal consent, patient was seated on exam table. Left knee was prepped with alcohol swab and utilizing anterolateral approach, patient's left knee space was injected with 4:1  marcaine 0.5%: Kenalog 40mg /dL. Patient tolerated the procedure well without immediate complications.   Impression and Recommendations:     This case required medical decision making of moderate complexity. The above documentation has  been reviewed and is accurate and complete Lyndal Pulley, DO       Note: This dictation was prepared with Dragon dictation along with smaller phrase technology. Any transcriptional errors that result from this process are unintentional.

## 2018-11-20 ENCOUNTER — Ambulatory Visit: Payer: Medicare HMO | Admitting: Family Medicine

## 2018-11-21 ENCOUNTER — Ambulatory Visit: Payer: Medicare HMO | Admitting: Family Medicine

## 2018-11-21 ENCOUNTER — Other Ambulatory Visit: Payer: Self-pay

## 2018-11-21 ENCOUNTER — Encounter: Payer: Self-pay | Admitting: Family Medicine

## 2018-11-21 DIAGNOSIS — M170 Bilateral primary osteoarthritis of knee: Secondary | ICD-10-CM

## 2018-11-21 NOTE — Patient Instructions (Signed)
See [place] again in 3 months

## 2018-11-21 NOTE — Assessment & Plan Note (Signed)
Repeat injection given today.  Tolerated the procedure well.  Discussed icing regimen and home exercises, discussed topical anti-inflammatories.  Patient wants to hold on any type of viscosupplementation.  Follow-up with me again in 3 months.

## 2018-11-29 ENCOUNTER — Telehealth: Payer: Self-pay

## 2018-11-29 NOTE — Telephone Encounter (Signed)
Received phone call from [facility] that pt hasn't filled his blood pressure medicine since around May or June. Pt had called in for a refill, but nothing was ever sent. I don't see anything in his chart regarding a request for a refill. Do you want to go ahead and refill it or have him keep checking his bp's?

## 2018-11-30 ENCOUNTER — Telehealth: Payer: Self-pay | Admitting: Family Medicine

## 2018-11-30 ENCOUNTER — Other Ambulatory Visit: Payer: Self-pay

## 2018-11-30 NOTE — Telephone Encounter (Signed)
Im not at the office today, Im at Dr Verlene Mayer

## 2018-11-30 NOTE — Telephone Encounter (Signed)
Pt called  [phone]

## 2018-11-30 NOTE — Telephone Encounter (Signed)
Whenever you have time, can you get him set up for a blood pressure follow up? Dr. Ethelene Hal would like for him to be fasting.

## 2018-11-30 NOTE — Telephone Encounter (Signed)
Can we get pt set up for a follow up appointment for his blood pressure?

## 2018-11-30 NOTE — Telephone Encounter (Signed)
In my last note, I had asked him specifically to fu for his bp and cholesterol. Please see note. He needs to return fasting.

## 2018-11-30 NOTE — Telephone Encounter (Signed)
LVM for patient to callback and schedule follow up for BP and cholesterol.

## 2018-12-01 ENCOUNTER — Ambulatory Visit (INDEPENDENT_AMBULATORY_CARE_PROVIDER_SITE_OTHER): Payer: Medicare HMO | Admitting: Family Medicine

## 2018-12-01 ENCOUNTER — Ambulatory Visit: Payer: Medicare HMO | Admitting: Family Medicine

## 2018-12-01 ENCOUNTER — Encounter: Payer: Self-pay | Admitting: Family Medicine

## 2018-12-01 VITALS — BP 120/70 | HR 81 | Ht 73.0 in | Wt 232.4 lb

## 2018-12-01 DIAGNOSIS — F419 Anxiety disorder, unspecified: Secondary | ICD-10-CM | POA: Diagnosis not present

## 2018-12-01 DIAGNOSIS — Z23 Encounter for immunization: Secondary | ICD-10-CM

## 2018-12-01 DIAGNOSIS — R69 Illness, unspecified: Secondary | ICD-10-CM | POA: Diagnosis not present

## 2018-12-01 DIAGNOSIS — E785 Hyperlipidemia, unspecified: Secondary | ICD-10-CM

## 2018-12-01 DIAGNOSIS — I10 Essential (primary) hypertension: Secondary | ICD-10-CM | POA: Diagnosis not present

## 2018-12-01 DIAGNOSIS — Z8546 Personal history of malignant neoplasm of prostate: Secondary | ICD-10-CM

## 2018-12-01 LAB — URINALYSIS, ROUTINE W REFLEX MICROSCOPIC
Bilirubin Urine: NEGATIVE
Hgb urine dipstick: NEGATIVE
Ketones, ur: NEGATIVE
Leukocytes,Ua: NEGATIVE
Nitrite: NEGATIVE
RBC / HPF: NONE SEEN (ref 0–?)
Specific Gravity, Urine: 1.02 (ref 1.000–1.030)
Total Protein, Urine: NEGATIVE
Urine Glucose: NEGATIVE
Urobilinogen, UA: 0.2 (ref 0.0–1.0)
pH: 6 (ref 5.0–8.0)

## 2018-12-01 LAB — LIPID PANEL
Cholesterol: 134 mg/dL (ref 0–200)
HDL: 59.4 mg/dL (ref >39.00)
LDL Cholesterol: 62 mg/dL (ref 0–99)
NonHDL: 74.22
Total CHOL/HDL Ratio: 2
Triglycerides: 59 mg/dL (ref 0.0–149.0)
VLDL: 11.8 mg/dL (ref 0.0–40.0)

## 2018-12-01 LAB — COMPREHENSIVE METABOLIC PANEL
ALT: 22 U/L (ref 0–53)
AST: 21 U/L (ref 0–37)
Albumin: 4.5 g/dL (ref 3.5–5.2)
Alkaline Phosphatase: 87 U/L (ref 39–117)
BUN: 18 mg/dL (ref 6–23)
CO2: 26 mEq/L (ref 19–32)
Calcium: 9.7 mg/dL (ref 8.4–10.5)
Chloride: 101 mEq/L (ref 96–112)
Creatinine, Ser: 0.86 mg/dL (ref 0.40–1.50)
GFR: 88.34 mL/min (ref >60.00)
Glucose, Bld: 92 mg/dL (ref 70–99)
Potassium: 5.1 mEq/L (ref 3.5–5.1)
Sodium: 135 mEq/L (ref 135–145)
Total Bilirubin: 0.8 mg/dL (ref 0.2–1.2)
Total Protein: 6.4 g/dL (ref 6.0–8.3)

## 2018-12-01 LAB — CBC
HCT: 45.4 % (ref 39.0–52.0)
Hemoglobin: 15.2 g/dL (ref 13.0–17.0)
MCHC: 33.4 g/dL (ref 30.0–36.0)
MCV: 89.6 fl (ref 78.0–100.0)
Platelets: 134 10*3/uL — ABNORMAL LOW (ref 150.0–400.0)
RBC: 5.07 Mil/uL (ref 4.22–5.81)
RDW: 13.7 % (ref 11.5–15.5)
WBC: 7.7 10*3/uL (ref 4.0–10.5)

## 2018-12-01 LAB — PSA: PSA: 0 ng/mL — ABNORMAL LOW (ref 0.10–4.00)

## 2018-12-01 LAB — MICROALBUMIN / CREATININE URINE RATIO
Creatinine,U: 129 mg/dL
Microalb Creat Ratio: 1.1 mg/g (ref 0.0–30.0)
Microalb, Ur: 1.5 mg/dL (ref 0.0–1.9)

## 2018-12-01 NOTE — Progress Notes (Signed)
Established Patient Office Visit  Subjective:  Patient ID: Christopher Murillo., male    DOB: [birth year]  Age: 68 y.o. MRN: [id number]  CC:  Chief Complaint  Patient presents with  . Follow-up    HPI Christopher [name]. presents for follow-up of his hypertension, elevated cholesterol and anxiety.  Patient has been compliant with his Hyzaar.  He took his last pill yesterday.  His blood pressures been well controlled.  LDL cholesterol at 63 on 40 mg of atorvastatin.  No issues with this medication.  Using Xanax as needed.  Continues to exercise 3-4 times weekly at the gym.  Has sustained manageable incontinence status post prostatectomy in 2009.  He has seen the dentist  Past Medical History:  Diagnosis Date  . BENIGN PROSTATIC HYPERTROPHY 07/28/2006  . GERD 07/28/2006  . HYPERLIPIDEMIA 07/28/2006  . NEOPLASM, MALIGNANT, PROSTATE 08/01/2007  . NISSEN FUNDOPLICATION, HX OF Q000111Q  . Unspecified essential hypertension 03/27/2007    Past Surgical History:  Procedure Laterality Date  . NISSEN FUNDOPLICATION  99991111  . PROSTATECTOMY  2009  . TONSILLECTOMY      Family History  Problem Relation Age of Onset  . Heart disease Mother   . Prostate cancer Father        prostate  . Colon cancer Neg Hx     Social History   Socioeconomic History  . Marital status: Married    Spouse name: Not on file  . Number of children: Not on file  . Years of education: Not on file  . Highest education level: Not on file  Occupational History  . Not on file  Social Needs  . Financial resource strain: Not on file  . Food insecurity    Worry: Not on file    Inability: Not on file  . Transportation needs    Medical: Not on file    Non-medical: Not on file  Tobacco Use  . Smoking status: Former Research scientist (life sciences)  . Smokeless tobacco: Never Used  . Tobacco comment: smoked in college  Substance and Sexual Activity  . Alcohol use: Yes    Alcohol/week: 0.0 standard drinks    Comment: rarely; once or twice  yearly  . Drug use: No  . Sexual activity: Not on file  Lifestyle  . Physical activity    Days per week: Not on file    Minutes per session: Not on file  . Stress: Not on file  Relationships  . Social Herbalist on phone: Not on file    Gets together: Not on file    Attends religious service: Not on file    Active member of club or organization: Not on file    Attends meetings of clubs or organizations: Not on file    Relationship status: Not on file  . Intimate partner violence    Fear of current or ex partner: Not on file    Emotionally abused: Not on file    Physically abused: Not on file    Forced sexual activity: Not on file  Other Topics Concern  . Not on file  Social History Narrative  . Not on file    Outpatient Medications Prior to Visit  Medication Sig Dispense Refill  . ALPRAZolam (XANAX) 0.5 MG tablet TAKE 1 TABLET (0.5 MG TOTAL) BY MOUTH 2 (TWO) TIMES DAILY AS NEEDED. 60 tablet 3  . atorvastatin (LIPITOR) 40 MG tablet Take 1 tablet (40 mg total) by mouth daily. 90 tablet 4  .  cholecalciferol (VITAMIN D) 1000 units tablet Take 2,000 Units by mouth daily.    . Turmeric 500 MG CAPS Take 2 capsules by mouth daily.    Marland Kitchen losartan-hydrochlorothiazide (HYZAAR) 100-12.5 MG tablet Take 1 tablet by mouth daily. (Patient not taking: Reported on 12/01/2018) 90 tablet 3   No facility-administered medications prior to visit.     No Known Allergies  ROS Review of Systems  Constitutional: Negative.   HENT: Negative.   Eyes: Negative for photophobia and visual disturbance.  Respiratory: Negative.   Cardiovascular: Negative.   Gastrointestinal: Negative.   Endocrine: Negative for polyphagia and polyuria.  Genitourinary: Negative.   Musculoskeletal: Negative for gait problem and joint swelling.  Skin: Negative for pallor and rash.  Allergic/Immunologic: Negative for immunocompromised state.  Neurological: Negative for light-headedness and headaches.   Hematological: Does not bruise/bleed easily.  Psychiatric/Behavioral: Negative.       Objective:    Physical Exam  Constitutional: He is oriented to person, place, and time. He appears well-developed and well-nourished. No distress.  HENT:  Head: Normocephalic and atraumatic.  Right Ear: External ear normal.  Left Ear: External ear normal.  Mouth/Throat: Oropharynx is clear and moist. No oropharyngeal exudate.  Eyes: Pupils are equal, round, and reactive to light. Conjunctivae are normal. Right eye exhibits no discharge. Left eye exhibits no discharge. No scleral icterus.  Neck: Neck supple. No JVD present. No tracheal deviation present. No thyromegaly present.  Cardiovascular: Normal rate, regular rhythm and normal heart sounds.  Pulmonary/Chest: Effort normal and breath sounds normal. No stridor.  Abdominal: Bowel sounds are normal.  Musculoskeletal:        General: No edema.  Lymphadenopathy:    He has no cervical adenopathy.  Neurological: He is alert and oriented to person, place, and time.  Skin: Skin is warm and dry. He is not diaphoretic.  Psychiatric: He has a normal mood and affect. His behavior is normal.    BP 120/70   Pulse 81   Ht 6\' 1"  (1.854 m)   Wt 232 lb 6 oz (105.4 kg)   SpO2 98%   BMI 30.66 kg/m  Wt Readings from Last 3 Encounters:  12/01/18 232 lb 6 oz (105.4 kg)  11/21/18 236 lb (107 kg)  08/23/18 270 lb (122.5 kg)   BP Readings from Last 3 Encounters:  12/01/18 120/70  11/21/18 110/70  08/23/18 110/68   Guideline developer:  UpToDate (see UpToDate for funding source) Date Released: June 2014  Health Maintenance Due  Topic Date Due  . Hepatitis C Screening  Nov 14, 1950  . PNA vac Low Risk Adult (2 of 2 - PPSV23) 12/22/2016  . INFLUENZA VACCINE  10/07/2018    There are no preventive care reminders to display for this patient.  Lab Results  Component Value Date   TSH 1.29 01/05/2017   Lab Results  Component Value Date   WBC 6.8  01/05/2017   HGB 15.0 01/05/2017   HCT 44.4 01/05/2017   MCV 91.5 01/05/2017   PLT 163.0 01/05/2017   Lab Results  Component Value Date   NA 136 01/05/2017   K 4.4 01/05/2017   CO2 29 01/05/2017   GLUCOSE 96 01/05/2017   BUN 16 01/05/2017   CREATININE 0.83 01/05/2017   BILITOT 0.6 01/05/2017   ALKPHOS 80 01/05/2017   AST 22 01/05/2017   ALT 22 01/05/2017   PROT 6.2 01/05/2017   ALBUMIN 4.3 01/05/2017   CALCIUM 9.4 01/05/2017   GFR 98.38 01/05/2017   Lab Results  Component Value Date   CHOL 138 01/05/2017   Lab Results  Component Value Date   HDL 60.60 01/05/2017   Lab Results  Component Value Date   LDLCALC 67 01/05/2017   Lab Results  Component Value Date   TRIG 56.0 01/05/2017   Lab Results  Component Value Date   CHOLHDL 2 01/05/2017   No results found for: HGBA1C    Assessment & Plan:   Problem List Items Addressed This Visit      Cardiovascular and Mediastinum   Essential hypertension   Relevant Orders   CBC   Comprehensive metabolic panel   Urinalysis, Routine w reflex microscopic   Microalbumin / creatinine urine ratio     Other   Dyslipidemia   Relevant Orders   CBC   Comprehensive metabolic panel   Lipid panel   Anxiety - Primary   History of prostate cancer   Relevant Orders   PSA    Other Visit Diagnoses    Need for influenza vaccination       Relevant Orders   Flu Vaccine QUAD High Dose(Fluad) (Completed)      No orders of the defined types were placed in this encounter.   Follow-up: Return in about 6 months (around 05/31/2019).    Encouraged the patient to maintain his healthy and active lifestyle.  Discussed possible treatment plans for incontinence status post prostatectomy.  Follow-up in 6 months.

## 2018-12-05 ENCOUNTER — Other Ambulatory Visit: Payer: Self-pay

## 2018-12-05 MED ORDER — LOSARTAN POTASSIUM-HCTZ 100-12.5 MG PO TABS: 1.0000 | ORAL_TABLET | Freq: Every day | ORAL | 3 refills | Status: DC

## 2018-12-11 DIAGNOSIS — D1801 Hemangioma of skin and subcutaneous tissue: Secondary | ICD-10-CM | POA: Diagnosis not present

## 2018-12-11 DIAGNOSIS — D2239 Melanocytic nevi of other parts of face: Secondary | ICD-10-CM | POA: Diagnosis not present

## 2018-12-11 DIAGNOSIS — L720 Epidermal cyst: Secondary | ICD-10-CM | POA: Diagnosis not present

## 2018-12-11 DIAGNOSIS — L821 Other seborrheic keratosis: Secondary | ICD-10-CM | POA: Diagnosis not present

## 2018-12-11 DIAGNOSIS — L579 Skin changes due to chronic exposure to nonionizing radiation, unspecified: Secondary | ICD-10-CM | POA: Diagnosis not present

## 2018-12-11 DIAGNOSIS — L814 Other melanin hyperpigmentation: Secondary | ICD-10-CM | POA: Diagnosis not present

## 2018-12-11 DIAGNOSIS — D692 Other nonthrombocytopenic purpura: Secondary | ICD-10-CM | POA: Diagnosis not present

## 2018-12-13 ENCOUNTER — Other Ambulatory Visit: Payer: Self-pay | Admitting: Family Medicine

## 2018-12-13 MED ORDER — ATORVASTATIN CALCIUM 40 MG PO TABS: 40.0000 mg | ORAL_TABLET | Freq: Every day | ORAL | 0 refills | Status: DC

## 2018-12-13 NOTE — Telephone Encounter (Signed)
Medication Refill - Medication: atorvastatin (LIPITOR) 40 MG tablet    Has the patient contacted their pharmacy? Yes.   (Agent: If no, request that the patient contact the pharmacy for the refill.) (Agent: If yes, when and what did the pharmacy advise?)  Preferred Pharmacy (with phone number or street name):  [id number] - [place] [place] [address]  [address] [place] [address]  Phone: [phone] Fax: [phone]     Agent: Please be advised that RX refills may take up to 3 business days. We ask that you follow-up with your pharmacy.

## 2018-12-27 ENCOUNTER — Other Ambulatory Visit: Payer: Self-pay | Admitting: Family Medicine

## 2019-01-29 ENCOUNTER — Other Ambulatory Visit: Payer: Self-pay | Admitting: Family Medicine

## 2019-02-19 ENCOUNTER — Telehealth: Payer: Self-pay

## 2019-02-19 NOTE — Telephone Encounter (Signed)
Informed patient about location change

## 2019-02-19 NOTE — Progress Notes (Signed)
[name] [address], [place] [address] Phone: [phone] Subjective:   I Christopher [name] am serving as a Education administrator for Dr. Hulan Saas.  This visit occurred during the SARS-CoV-2 public health emergency.  Safety protocols were in place, including screening questions prior to the visit, additional usage of staff PPE, and extensive cleaning of exam room while observing appropriate contact time as indicated for disinfecting solutions.    CC: knee pain   [id number]   11/21/2018 Repeat injection given today.  Tolerated the procedure well.  Discussed icing regimen and home exercises, discussed topical anti-inflammatories.  Patient wants to hold on any type of viscosupplementation.  Follow-up with me again in 3 months.  02/20/2019 Christopher [name]. is a 68 y.o. male coming in with complaint of bilateral knee pain. Patient states that his knees feel good.  Known arthritic changes.  Patient has had some dull, throbbing aching pain on a fairly regular basis.  Patient is the primary caregiver for his ailing wife.   Last seen 3 months ago and given steroid injections bilaterally  Past Medical History:  Diagnosis Date  . BENIGN PROSTATIC HYPERTROPHY 07/28/2006  . GERD 07/28/2006  . HYPERLIPIDEMIA 07/28/2006  . NEOPLASM, MALIGNANT, PROSTATE 08/01/2007  . NISSEN FUNDOPLICATION, HX OF [id number]  . Unspecified essential hypertension 03/27/2007   Past Surgical History:  Procedure Laterality Date  . NISSEN FUNDOPLICATION  99991111  . PROSTATECTOMY  2009  . TONSILLECTOMY     Social History   Socioeconomic History  . Marital status: Married    Spouse name: Not on file  . Number of children: Not on file  . Years of education: Not on file  . Highest education level: Not on file  Occupational History  . Not on file  Tobacco Use  . Smoking status: Former Research scientist (life sciences)  . Smokeless tobacco: Never Used  . Tobacco comment: smoked in college  Substance and Sexual  Activity  . Alcohol use: Yes    Alcohol/week: 0.0 standard drinks    Comment: rarely; once or twice yearly  . Drug use: No  . Sexual activity: Not on file  Other Topics Concern  . Not on file  Social History Narrative  . Not on file   Social Determinants of Health   Financial Resource Strain:   . Difficulty of Paying Living Expenses: Not on file  Food Insecurity:   . Worried About Charity fundraiser in the Last Year: Not on file  . Ran Out of Food in the Last Year: Not on file  Transportation Needs:   . Lack of Transportation (Medical): Not on file  . Lack of Transportation (Non-Medical): Not on file  Physical Activity:   . Days of Exercise per Week: Not on file  . Minutes of Exercise per Session: Not on file  Stress:   . Feeling of Stress : Not on file  Social Connections:   . Frequency of Communication with Friends and Family: Not on file  . Frequency of Social Gatherings with Friends and Family: Not on file  . Attends Religious Services: Not on file  . Active Member of Clubs or Organizations: Not on file  . Attends Archivist Meetings: Not on file  . Marital Status: Not on file   No Known Allergies Family History  Problem Relation Age of Onset  . Heart disease Mother   . Prostate cancer Father        prostate  . Colon cancer  Neg Hx      Current Outpatient Medications (Cardiovascular):  .  atorvastatin (LIPITOR) 40 MG tablet, Take 1 tablet (40 mg total) by mouth daily. Marland Kitchen  losartan-hydrochlorothiazide (HYZAAR) 100-12.5 MG tablet, Take 1 tablet by mouth daily.     Current Outpatient Medications (Other):  Marland Kitchen  ALPRAZolam (XANAX) 0.5 MG tablet, TAKE 1 TABLET BY MOUTH TWICE A DAY AS NEEDED .  cholecalciferol (VITAMIN D) 1000 units tablet, Take 2,000 Units by mouth daily. .  Turmeric 500 MG CAPS, Take 2 capsules by mouth daily.    Past medical history, social, surgical and family history all reviewed in electronic medical record.  No pertanent  information unless stated regarding to the chief complaint.   Review of Systems:  No headache, visual changes, nausea, vomiting, diarrhea, constipation, dizziness, abdominal pain, skin rash, fevers, chills, night sweats, weight loss, swollen lymph nodes, body aches, joint swelling, muscle aches, chest pain, shortness of breath, mood changes.   Objective  There were no vitals taken for this visit. Systems examined below as of    General: No apparent distress alert and oriented x3 mood and affect normal, dressed appropriately.  HEENT: Pupils equal, extraocular movements intact  Respiratory: Patient's speak in full sentences and does not appear short of breath  Cardiovascular: No lower extremity edema, non tender, no erythema  Skin: Warm dry intact with no signs of infection or rash on extremities or on axial skeleton.  Abdomen: Soft nontender  Neuro: Cranial nerves II through XII are intact, neurovascularly intact in all extremities with 2+ DTRs and 2+ pulses.  Lymph: No lymphadenopathy of posterior or anterior cervical chain or axillae bilaterally.  Gait mild antalgic MSK:  tender with full range of motion and good stability and symmetric strength and tone of shoulders, elbows, wrist, hip, knee and ankles bilaterally.  Mild arthritic changes of multiple joints Knee: Bilateral valgus deformity noted. Large thigh to calf ratio.  Tender to palpation over medial and PF joint line.  ROM full in flexion and extension and lower leg rotation. instability with valgus force.  painful patellar compression. Patellar glide with moderate crepitus. Patellar and quadriceps tendons unremarkable. Hamstring and quadriceps strength is normal.  After informed written and verbal consent, patient was seated on exam table. Right knee was prepped with alcohol swab and utilizing anterolateral approach, patient's right knee space was injected with 4:1  marcaine 0.5%: Kenalog 40mg /dL. Patient tolerated the procedure  well without immediate complications.  After informed written and verbal consent, patient was seated on exam table. Left knee was prepped with alcohol swab and utilizing anterolateral approach, patient's left knee space was injected with 4:1  marcaine 0.5%: Kenalog 40mg /dL. Patient tolerated the procedure well without immediate complications.   Impression and Recommendations:     This case required medical decision making of moderate complexity. The above documentation has been reviewed and is accurate and complete Lyndal Pulley, DO       Note: This dictation was prepared with Dragon dictation along with smaller phrase technology. Any transcriptional errors that result from this process are unintentional.

## 2019-02-20 ENCOUNTER — Other Ambulatory Visit: Payer: Self-pay

## 2019-02-20 ENCOUNTER — Encounter: Payer: Self-pay | Admitting: Family Medicine

## 2019-02-20 ENCOUNTER — Ambulatory Visit: Payer: Medicare HMO | Admitting: Family Medicine

## 2019-02-20 DIAGNOSIS — M170 Bilateral primary osteoarthritis of knee: Secondary | ICD-10-CM | POA: Diagnosis not present

## 2019-02-20 NOTE — Assessment & Plan Note (Signed)
With patient doing fairly well patient has continued to increase activity as tolerated, discussed icing regimen and home exercises, discussed with activity do which wants to avoid.  Patient is to increase activity slowly over the course the next several weeks.  Patient will follow up with me again in 12 weeks

## 2019-02-20 NOTE — Patient Instructions (Addendum)
Wt Readings from Last 3 Encounters:  02/20/19 235 lb (106.6 kg)  12/01/18 232 lb 6 oz (105.4 kg)  11/21/18 236 lb (107 kg)   04/19/18 236   [address], [address] [place], [place] [address] Phone [phone]  Good to see you See me again in 3 months

## 2019-02-26 ENCOUNTER — Ambulatory Visit: Payer: Self-pay | Admitting: *Deleted

## 2019-02-26 ENCOUNTER — Telehealth: Payer: Self-pay

## 2019-02-26 NOTE — Telephone Encounter (Signed)
Called pharmacy because pt already on combination of his losartan and HCTZ, pharmacy stated they have this rx on file and can get this ready for pt. Attempted to reach pt to make sure he understood this but he did not answer. Left vm to call back

## 2019-02-26 NOTE — Telephone Encounter (Signed)
Made in error

## 2019-02-26 NOTE — Telephone Encounter (Signed)
I spoke with patient and informed him of previous message.  .  Patient verbalized understanding. Patient will pick up rx

## 2019-02-26 NOTE — Telephone Encounter (Signed)
I returned his call.   He is requesting his losartan and HCTZ be prescribed as 1 pill instead of the 2 pills  1 losartan; 1 HCTZ daily.  [facility] on the HCTZ however he does have refills.   Dr. Ethelene Hal gave him an rx in Sept 2020 with 3 refills.    He is down to his last 2 pills of HCTZ.    "If I can't get it refilled I'll just double up my losartan".    "I'm tired of messing with them". I instructed him not to double up his losartan dose.   It could drop his BP too low.  He verbalized understanding and stated,  "OK so I won't do that then".  Then he brought up getting it as one pill.  I sent his request to Dr. Bebe Shaggy office.    Answer Assessment - Initial Assessment Questions 1.   NAME of MEDICATION: "What medicine are you calling about?"     Losartan-HCTZ 2.   QUESTION: "What is your question?"     I take 2 pills for the BP.    I've been trying to reorder my HCTZ but [facility].   Your records say I have 2 refills left.     3.   PRESCRIBING HCP: "Who prescribed it?" Reason: if prescribed by specialist, call should be referred to that group.     [facility] 4. SYMPTOMS: "Do you have any symptoms?"     None 5. SEVERITY: If symptoms are present, ask "Are they mild, moderate or severe?"     None  6.  PREGNANCY:  "Is there any chance that you are pregnant?" "When was your last menstrual period?"     N/A  Protocols used: MEDICATION QUESTION CALL-A-AH

## 2019-03-10 ENCOUNTER — Other Ambulatory Visit: Payer: Self-pay | Admitting: Family Medicine

## 2019-03-13 ENCOUNTER — Other Ambulatory Visit: Payer: Self-pay | Admitting: Family Medicine

## 2019-04-20 ENCOUNTER — Other Ambulatory Visit: Payer: Self-pay | Admitting: Family Medicine

## 2019-05-21 NOTE — Progress Notes (Signed)
[facility] [place] Phone: [phone] Subjective:   Fontaine No, am serving as a scribe for Dr. Hulan Saas. This visit occurred during the SARS-CoV-2 public health emergency.  Safety protocols were in place, including screening questions prior to the visit, additional usage of staff PPE, and extensive cleaning of exam room while observing appropriate contact time as indicated for disinfecting solutions.   I'm seeing this patient by the request  of:  Libby Maw, MD  CC: Bilateral knee pain follow-up  [id number]   02/20/2019 With patient doing fairly well patient has continued to increase activity as tolerated, discussed icing regimen and home exercises, discussed with activity do which wants to avoid.  Patient is to increase activity slowly over the course the next several weeks.  Patient will follow up with me again in 12 weeks  Update 05/22/2019 [name] is a 69 y.o. male coming in with complaint of bilateral knee pain. Patient states that his knee pain has increased as he has to take care of his wife who feel and injured her shoulder. Also has gained weight and feels that the weather increases his pain.     Bilateral knee injections were given February 20, 2019  Past Medical History:  Diagnosis Date  . BENIGN PROSTATIC HYPERTROPHY 07/28/2006  . GERD 07/28/2006  . HYPERLIPIDEMIA 07/28/2006  . NEOPLASM, MALIGNANT, PROSTATE 08/01/2007  . NISSEN FUNDOPLICATION, HX OF [id number]  . Unspecified essential hypertension 03/27/2007   Past Surgical History:  Procedure Laterality Date  . NISSEN FUNDOPLICATION  99991111  . PROSTATECTOMY  2009  . TONSILLECTOMY     Social History   Socioeconomic History  . Marital status: Married    Spouse name: Not on file  . Number of children: Not on file  . Years of education: Not on file  . Highest education level: Not on file  Occupational History  . Not on file   Tobacco Use  . Smoking status: Former Research scientist (life sciences)  . Smokeless tobacco: Never Used  . Tobacco comment: smoked in college  Substance and Sexual Activity  . Alcohol use: Yes    Alcohol/week: 0.0 standard drinks    Comment: rarely; once or twice yearly  . Drug use: No  . Sexual activity: Not on file  Other Topics Concern  . Not on file  Social History Narrative  . Not on file   Social Determinants of Health   Financial Resource Strain:   . Difficulty of Paying Living Expenses:   Food Insecurity:   . Worried About Charity fundraiser in the Last Year:   . Arboriculturist in the Last Year:   Transportation Needs:   . Film/video editor (Medical):   Marland Kitchen Lack of Transportation (Non-Medical):   Physical Activity:   . Days of Exercise per Week:   . Minutes of Exercise per Session:   Stress:   . Feeling of Stress :   Social Connections:   . Frequency of Communication with Friends and Family:   . Frequency of Social Gatherings with Friends and Family:   . Attends Religious Services:   . Active Member of Clubs or Organizations:   . Attends Archivist Meetings:   Marland Kitchen Marital Status:    No Known Allergies Family History  Problem Relation Age of Onset  . Heart disease Mother   . Prostate cancer Father        prostate  . Colon  cancer Neg Hx      Current Outpatient Medications (Cardiovascular):  .  atorvastatin (LIPITOR) 40 MG tablet, TAKE 1 TABLET BY MOUTH EVERY DAY .  losartan-hydrochlorothiazide (HYZAAR) 100-12.5 MG tablet, Take 1 tablet by mouth daily.     Current Outpatient Medications (Other):  Marland Kitchen  ALPRAZolam (XANAX) 0.5 MG tablet, TAKE 1 TABLET BY MOUTH TWICE A DAY AS NEEDED .  cholecalciferol (VITAMIN D) 1000 units tablet, Take 2,000 Units by mouth daily. .  Turmeric 500 MG CAPS, Take 2 capsules by mouth daily.   Reviewed prior external information including notes and imaging from  primary care provider As well as notes that were available from care  everywhere and other healthcare systems.  Past medical history, social, surgical and family history all reviewed in electronic medical record.  No pertanent information unless stated regarding to the chief complaint.   Review of Systems:  No headache, visual changes, nausea, vomiting, diarrhea, constipation, dizziness, abdominal pain, skin rash, fevers, chills, night sweats, weight loss, swollen lymph nodes, body aches, joint swelling, chest pain, shortness of breath, mood changes. POSITIVE muscle aches  Objective  There were no vitals taken for this visit.   General: No apparent distress alert and oriented x3 mood and affect normal, dressed appropriately.  HEENT: Pupils equal, extraocular movements intact  Respiratory: Patient's speak in full sentences and does not appear short of breath  Cardiovascular: No lower extremity edema, non tender, no erythema  Skin: Warm dry intact with no signs of infection or rash on extremities or on axial skeleton.  Abdomen: Soft nontender  Neuro: Cranial nerves II through XII are intact, neurovascularly intact in all extremities with 2+ DTRs and 2+ pulses.  Lymph: No lymphadenopathy of posterior or anterior cervical chain or axillae bilaterally.  Gait antalgic MSK:  Knee: Bilateral valgus deformity noted.  Abnormal thigh to calf ratio.  Tender to palpation over medial and PF joint line.  ROM full in flexion and extension and lower leg rotation. instability with valgus force.  painful patellar compression. Patellar glide with moderate crepitus. Patellar and quadriceps tendons unremarkable. Hamstring and quadriceps strength is normal.  After informed written and verbal consent, patient was seated on exam table. Right knee was prepped with alcohol swab and utilizing anterolateral approach, patient's right knee space was injected with 4:1  marcaine 0.5%: Kenalog 40mg /dL. Patient tolerated the procedure well without immediate complications.  After informed  written and verbal consent, patient was seated on exam table. Left knee was prepped with alcohol swab and utilizing anterolateral approach, patient's left knee space was injected with 4:1  marcaine 0.5%: Kenalog 40mg /dL. Patient tolerated the procedure well without immediate complications.    Impression and Recommendations:     This case required medical decision making of moderate complexity. The above documentation has been reviewed and is accurate and complete Lyndal Pulley, DO       Note: This dictation was prepared with Dragon dictation along with smaller phrase technology. Any transcriptional errors that result from this process are unintentional.

## 2019-05-22 ENCOUNTER — Other Ambulatory Visit: Payer: Self-pay

## 2019-05-22 ENCOUNTER — Ambulatory Visit: Payer: Medicare HMO | Admitting: Family Medicine

## 2019-05-22 ENCOUNTER — Encounter: Payer: Self-pay | Admitting: Family Medicine

## 2019-05-22 DIAGNOSIS — M170 Bilateral primary osteoarthritis of knee: Secondary | ICD-10-CM | POA: Diagnosis not present

## 2019-05-22 NOTE — Patient Instructions (Addendum)
Injected both knees today °See me again in 3 months °

## 2019-05-22 NOTE — Assessment & Plan Note (Signed)
Bilateral injections given again today.  Tolerated the procedure well, discussed icing regimen and home exercises, increase activity slowly.  Follow-up with me again 4 weeks.  Chronic problem with exacerbation.  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

## 2019-05-29 ENCOUNTER — Other Ambulatory Visit: Payer: Self-pay | Admitting: Family Medicine

## 2019-06-12 ENCOUNTER — Other Ambulatory Visit: Payer: Self-pay | Admitting: Family Medicine

## 2019-07-06 ENCOUNTER — Other Ambulatory Visit: Payer: Self-pay | Admitting: Family Medicine

## 2019-07-11 DIAGNOSIS — L821 Other seborrheic keratosis: Secondary | ICD-10-CM | POA: Diagnosis not present

## 2019-07-11 DIAGNOSIS — L579 Skin changes due to chronic exposure to nonionizing radiation, unspecified: Secondary | ICD-10-CM | POA: Diagnosis not present

## 2019-07-11 DIAGNOSIS — L814 Other melanin hyperpigmentation: Secondary | ICD-10-CM | POA: Diagnosis not present

## 2019-07-11 DIAGNOSIS — L720 Epidermal cyst: Secondary | ICD-10-CM | POA: Diagnosis not present

## 2019-07-13 DIAGNOSIS — H2513 Age-related nuclear cataract, bilateral: Secondary | ICD-10-CM | POA: Diagnosis not present

## 2019-07-13 DIAGNOSIS — H5211 Myopia, right eye: Secondary | ICD-10-CM | POA: Diagnosis not present

## 2019-07-13 DIAGNOSIS — D3131 Benign neoplasm of right choroid: Secondary | ICD-10-CM | POA: Diagnosis not present

## 2019-07-13 DIAGNOSIS — H524 Presbyopia: Secondary | ICD-10-CM | POA: Diagnosis not present

## 2019-07-17 DIAGNOSIS — L02212 Cutaneous abscess of back [any part, except buttock]: Secondary | ICD-10-CM | POA: Diagnosis not present

## 2019-07-17 DIAGNOSIS — Z008 Encounter for other general examination: Secondary | ICD-10-CM | POA: Diagnosis not present

## 2019-08-08 DIAGNOSIS — Z008 Encounter for other general examination: Secondary | ICD-10-CM | POA: Diagnosis not present

## 2019-08-08 DIAGNOSIS — Z8546 Personal history of malignant neoplasm of prostate: Secondary | ICD-10-CM | POA: Diagnosis not present

## 2019-08-08 DIAGNOSIS — M1990 Unspecified osteoarthritis, unspecified site: Secondary | ICD-10-CM | POA: Diagnosis not present

## 2019-08-08 DIAGNOSIS — E785 Hyperlipidemia, unspecified: Secondary | ICD-10-CM | POA: Diagnosis not present

## 2019-08-08 DIAGNOSIS — Z8673 Personal history of transient ischemic attack (TIA), and cerebral infarction without residual deficits: Secondary | ICD-10-CM | POA: Diagnosis not present

## 2019-08-08 DIAGNOSIS — R69 Illness, unspecified: Secondary | ICD-10-CM | POA: Diagnosis not present

## 2019-08-08 DIAGNOSIS — Z809 Family history of malignant neoplasm, unspecified: Secondary | ICD-10-CM | POA: Diagnosis not present

## 2019-08-08 DIAGNOSIS — E669 Obesity, unspecified: Secondary | ICD-10-CM | POA: Diagnosis not present

## 2019-08-08 DIAGNOSIS — Z8249 Family history of ischemic heart disease and other diseases of the circulatory system: Secondary | ICD-10-CM | POA: Diagnosis not present

## 2019-08-08 DIAGNOSIS — Z6831 Body mass index (BMI) 31.0-31.9, adult: Secondary | ICD-10-CM | POA: Diagnosis not present

## 2019-08-08 DIAGNOSIS — I10 Essential (primary) hypertension: Secondary | ICD-10-CM | POA: Diagnosis not present

## 2019-08-13 ENCOUNTER — Other Ambulatory Visit: Payer: Self-pay | Admitting: Family Medicine

## 2019-08-22 ENCOUNTER — Ambulatory Visit (INDEPENDENT_AMBULATORY_CARE_PROVIDER_SITE_OTHER): Payer: Medicare HMO

## 2019-08-22 ENCOUNTER — Ambulatory Visit: Payer: Medicare HMO | Admitting: Family Medicine

## 2019-08-22 ENCOUNTER — Encounter: Payer: Self-pay | Admitting: Family Medicine

## 2019-08-22 ENCOUNTER — Other Ambulatory Visit: Payer: Self-pay

## 2019-08-22 VITALS — BP 110/78 | HR 74 | Ht 73.0 in | Wt 242.0 lb

## 2019-08-22 DIAGNOSIS — M25512 Pain in left shoulder: Secondary | ICD-10-CM

## 2019-08-22 DIAGNOSIS — M170 Bilateral primary osteoarthritis of knee: Secondary | ICD-10-CM

## 2019-08-22 DIAGNOSIS — M7551 Bursitis of right shoulder: Secondary | ICD-10-CM | POA: Insufficient documentation

## 2019-08-22 DIAGNOSIS — M25511 Pain in right shoulder: Secondary | ICD-10-CM

## 2019-08-22 DIAGNOSIS — M47812 Spondylosis without myelopathy or radiculopathy, cervical region: Secondary | ICD-10-CM | POA: Diagnosis not present

## 2019-08-22 NOTE — Assessment & Plan Note (Signed)
Patient appears to have more of a bursitis.  Questionable rotator cuff partial tear but patient has 5-5 strength start with x-rays of the shoulder and the neck today.  They are also going to topical anti-inflammatories.  Discussed keeping hands within peripheral vision.  Discussed icing regimen, home exercises.  Follow-up again in 4 to 8 weeks

## 2019-08-22 NOTE — Progress Notes (Signed)
[place] [address] [place] Phone: [phone] Subjective:   I Christopher [name] am serving as a Education administrator for Dr. Hulan Saas.  This visit occurred during the SARS-CoV-2 public health emergency.  Safety protocols were in place, including screening questions prior to the visit, additional usage of staff PPE, and extensive cleaning of exam room while observing appropriate contact time as indicated for disinfecting solutions.   I'm seeing this patient by the request  of:  Libby Maw, MD  CC: Bilateral knee pain, new onset of right shoulder pain  [id number]   05/22/2019 Bilateral injections given again today.  Tolerated the procedure well, discussed icing regimen and home exercises, increase activity slowly.  Follow-up with me again 4 weeks.  Chronic problem with exacerbation.  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 is a 69 y.o. male coming in with complaint of bilateral knee pain. Patient states his knees feel about the same. Feels the same as 3-6 months ago. Bilateral shoulder pain. Patient states he used a shoulder machine at the gym. Abduction machine. Shoulder flexion causes numbness down the arm. Shoulder is weak. Left is doing better than right. Right shoulder pops.   Onset- Early march  Character-aching sensation Aggravating factors- flexion Therapies tried- Topical with moderate improvement     Past Medical History:  Diagnosis Date  . BENIGN PROSTATIC HYPERTROPHY 07/28/2006  . GERD 07/28/2006  . HYPERLIPIDEMIA 07/28/2006  . NEOPLASM, MALIGNANT, PROSTATE 08/01/2007  . NISSEN FUNDOPLICATION, HX OF 0/25/8527  . Unspecified essential hypertension 03/27/2007   Past Surgical History:  Procedure Laterality Date  . NISSEN FUNDOPLICATION  7824  . PROSTATECTOMY  2009  . TONSILLECTOMY     Social History    Socioeconomic History  . Marital status: Married    Spouse name: Not on file  . Number of children: Not on file  . Years of education: Not on file  . Highest education level: Not on file  Occupational History  . Not on file  Tobacco Use  . Smoking status: Former Research scientist (life sciences)  . Smokeless tobacco: Never Used  . Tobacco comment: smoked in college  Substance and Sexual Activity  . Alcohol use: Yes    Alcohol/week: 0.0 standard drinks    Comment: rarely; once or twice yearly  . Drug use: No  . Sexual activity: Not on file  Other Topics Concern  . Not on file  Social History Narrative  . Not on file   Social Determinants of Health   Financial Resource Strain:   . Difficulty of Paying Living Expenses:   Food Insecurity:   . Worried About Charity fundraiser in the Last Year:   . Arboriculturist in the Last Year:   Transportation Needs:   . Film/video editor (Medical):   Marland Kitchen Lack of Transportation (Non-Medical):   Physical Activity:   . Days of Exercise per Week:   . Minutes of Exercise per Session:   Stress:   . Feeling of Stress :   Social Connections:   . Frequency of Communication with Friends and Family:   . Frequency of Social Gatherings with Friends and Family:   . Attends Religious Services:   . Active Member of Clubs or Organizations:   . Attends Archivist Meetings:   Marland Kitchen Marital Status:    No Known Allergies Family History  Problem Relation Age of Onset  . Heart disease Mother   . Prostate cancer Father        prostate  . Colon cancer Neg Hx      Current Outpatient Medications (Cardiovascular):  .  atorvastatin (LIPITOR) 40 MG tablet, TAKE 1 TABLET BY MOUTH EVERY DAY .  losartan-hydrochlorothiazide (HYZAAR) 100-12.5 MG tablet, Take 1 tablet by mouth daily.     Current Outpatient Medications (Other):  Marland Kitchen  ALPRAZolam (XANAX) 0.5 MG tablet, TAKE 1 TABLET BY MOUTH TWICE A DAY AS NEEDED .  cholecalciferol (VITAMIN D) 1000 units tablet, Take  2,000 Units by mouth daily. .  Turmeric 500 MG CAPS, Take 2 capsules by mouth daily.   Reviewed prior external information including notes and imaging from  primary care provider As well as notes that were available from care everywhere and other healthcare systems.  Past medical history, social, surgical and family history all reviewed in electronic medical record.  No pertanent information unless stated regarding to the chief complaint.   Review of Systems:  No headache, visual changes, nausea, vomiting, diarrhea, constipation, dizziness, abdominal pain, skin rash, fevers, chills, night sweats, weight loss, swollen lymph nodes, body aches, joint swelling, chest pain, shortness of breath, mood changes. POSITIVE muscle aches  Objective  Blood pressure 110/78, pulse 74, height 6\' 1"  (1.854 m), weight 242 lb (109.8 kg), SpO2 97 %.   General: No apparent distress alert and oriented x3 mood and affect normal, dressed appropriately.  HEENT: Pupils equal, extraocular movements intact  Respiratory: Patient's speak in full sentences and does not appear short of breath  Cardiovascular: No lower extremity edema, non tender, no erythema  Neuro: Cranial nerves II through XII are intact, neurovascularly intact in all extremities with 2+ DTRs and 2+ pulses.  Gait normal with good balance and coordination.  MSK:   Bilateral knee exam shows some crepitus.  Trace effusion of the knees bilaterally.  Patient lacks the last 2 degrees of extension and lacks 5 degrees of flexion.  Right shoulder exam positive impingement. Can last 5 degrees of extension.  Mild positive Spurling's though with radicular symptoms on the right side.  5/5 strength of the upper extremity.  After informed written and verbal consent, patient was seated on exam table. Right knee was prepped with alcohol swab and utilizing anterolateral approach, patient's right knee space was injected with 4:1  marcaine 0.5%: Kenalog 40mg /dL. Patient  tolerated the procedure well without immediate complications.  After informed written and verbal consent, patient was seated on exam table. Left knee was prepped with alcohol swab and utilizing anterolateral approach, patient's left knee space was injected with 4:1  marcaine 0.5%: Kenalog 40mg /dL. Patient tolerated the procedure well without immediate complications.    Impression and Recommendations:     The above documentation has been reviewed and is accurate and complete Lyndal Pulley, DO       Note: This dictation was prepared with Dragon dictation along with smaller phrase technology. Any transcriptional errors that result from this process are unintentional.

## 2019-08-22 NOTE — Patient Instructions (Addendum)
Good to see you Xray today  We will get you approved Exercise 3 times a week Use pennsaid small amount over most painful spot 2 times a day See me again in 2 months

## 2019-08-22 NOTE — Assessment & Plan Note (Signed)
Bilateral injections given today for the chronic problem with exacerbation.  I do believe patient could be a candidate for viscosupplementation.  We will see if we can get approval for this.  Discussed icing regimen and home exercises.  Increase activity slowly.  Follow-up again for 8 weeks.

## 2019-09-06 DIAGNOSIS — L02212 Cutaneous abscess of back [any part, except buttock]: Secondary | ICD-10-CM | POA: Diagnosis not present

## 2019-09-19 ENCOUNTER — Telehealth: Payer: Self-pay | Admitting: Family Medicine

## 2019-09-19 DIAGNOSIS — L02212 Cutaneous abscess of back [any part, except buttock]: Secondary | ICD-10-CM | POA: Diagnosis not present

## 2019-09-19 NOTE — Telephone Encounter (Signed)
[facility] called Dewaine Oats) following up on [place] request. Insurance does NOT cover Monovisc but has approved Synvisc for this patient ( on file ). I advised [facility] to close this case as have Synvisc approval on file.

## 2019-09-20 ENCOUNTER — Other Ambulatory Visit: Payer: Self-pay | Admitting: Family Medicine

## 2019-09-20 NOTE — Telephone Encounter (Signed)
Noted thanks °

## 2019-10-17 ENCOUNTER — Encounter: Payer: Self-pay | Admitting: Family Medicine

## 2019-10-17 ENCOUNTER — Ambulatory Visit: Payer: Medicare HMO | Admitting: Family Medicine

## 2019-10-17 ENCOUNTER — Ambulatory Visit (INDEPENDENT_AMBULATORY_CARE_PROVIDER_SITE_OTHER): Payer: Medicare HMO

## 2019-10-17 ENCOUNTER — Other Ambulatory Visit: Payer: Self-pay

## 2019-10-17 VITALS — BP 110/70 | HR 73 | Ht 73.0 in | Wt 237.0 lb

## 2019-10-17 DIAGNOSIS — M170 Bilateral primary osteoarthritis of knee: Secondary | ICD-10-CM | POA: Diagnosis not present

## 2019-10-17 DIAGNOSIS — M7551 Bursitis of right shoulder: Secondary | ICD-10-CM

## 2019-10-17 DIAGNOSIS — M19011 Primary osteoarthritis, right shoulder: Secondary | ICD-10-CM | POA: Diagnosis not present

## 2019-10-17 NOTE — Progress Notes (Signed)
[name] [place] Phone: [phone] Subjective:   Christopher [name], am serving as a scribe for Dr. Hulan Saas. This visit occurred during the SARS-CoV-2 public health emergency.  Safety protocols were in place, including screening questions prior to the visit, additional usage of staff PPE, and extensive cleaning of exam room while observing appropriate contact time as indicated for disinfecting solutions.   I'm seeing this patient by the request  of:  Libby Maw, MD  CC: Bilateral knee pain and R shoulder pain   [id number]  Christopher [name]. is a 69 y.o. male coming in with complaint of knee pain   08/22/2019 Patient appears to have more of a bursitis.  Questionable rotator cuff partial tear but patient has 5-5 strength start with x-rays of the shoulder and the neck today. Bilateral injections given today for the chronic problem with exacerbation. Synvisc approved for both knees   Update 10/17/2019 Shoulders L 90% better but R one still feels like it has aways to go. States doing his exercises.   Knees are on par for 2 months after his injections. Feels like he is ready for more. Is approved for synvisc. worsening pain and instability      Past Medical History:  Diagnosis Date  . BENIGN PROSTATIC HYPERTROPHY 07/28/2006  . GERD 07/28/2006  . HYPERLIPIDEMIA 07/28/2006  . NEOPLASM, MALIGNANT, PROSTATE 08/01/2007  . NISSEN FUNDOPLICATION, HX OF 9/70/2637  . Unspecified essential hypertension 03/27/2007   Past Surgical History:  Procedure Laterality Date  . NISSEN FUNDOPLICATION  8588  . PROSTATECTOMY  2009  . TONSILLECTOMY     Social History   Socioeconomic History  . Marital status: Married    Spouse name: Not on file  . Number of children: Not on file  . Years of education: Not on file  . Highest education level: Not on file  Occupational History  . Not on file  Tobacco Use  . Smoking  status: Former Research scientist (life sciences)  . Smokeless tobacco: Never Used  . Tobacco comment: smoked in college  Substance and Sexual Activity  . Alcohol use: Yes    Alcohol/week: 0.0 standard drinks    Comment: rarely; once or twice yearly  . Drug use: No  . Sexual activity: Not on file  Other Topics Concern  . Not on file  Social History Narrative  . Not on file   Social Determinants of Health   Financial Resource Strain:   . Difficulty of Paying Living Expenses:   Food Insecurity:   . Worried About Charity fundraiser in the Last Year:   . Arboriculturist in the Last Year:   Transportation Needs:   . Film/video editor (Medical):   Marland Kitchen Lack of Transportation (Non-Medical):   Physical Activity:   . Days of Exercise per Week:   . Minutes of Exercise per Session:   Stress:   . Feeling of Stress :   Social Connections:   . Frequency of Communication with Friends and Family:   . Frequency of Social Gatherings with Friends and Family:   . Attends Religious Services:   . Active Member of Clubs or Organizations:   . Attends Archivist Meetings:   Marland Kitchen Marital Status:    No Known Allergies Family History  Problem Relation Age of Onset  . Heart disease Mother   . Prostate cancer Father        prostate  . Colon  cancer Neg Hx      Current Outpatient Medications (Cardiovascular):  .  atorvastatin (LIPITOR) 40 MG tablet, TAKE 1 TABLET BY MOUTH EVERY DAY .  losartan-hydrochlorothiazide (HYZAAR) 100-12.5 MG tablet, Take 1 tablet by mouth daily.     Current Outpatient Medications (Other):  Marland Kitchen  ALPRAZolam (XANAX) 0.5 MG tablet, TAKE 1 TABLET BY MOUTH TWICE A DAY AS NEEDED .  cholecalciferol (VITAMIN D) 1000 units tablet, Take 2,000 Units by mouth daily. .  Turmeric 500 MG CAPS, Take 2 capsules by mouth daily.   Reviewed prior external information including notes and imaging from  primary care provider As well as notes that were available from care everywhere and other healthcare  systems.  Past medical history, social, surgical and family history all reviewed in electronic medical record.  No pertanent information unless stated regarding to the chief complaint.   Review of Systems:  No headache, visual changes, nausea, vomiting, diarrhea, constipation, dizziness, abdominal pain, skin rash, fevers, chills, night sweats, weight loss, swollen lymph nodes, body aches, joint swelling, chest pain, shortness of breath, mood changes. POSITIVE muscle aches  Objective  Blood pressure 110/70, pulse 73, height 6\' 1"  (1.854 m), weight 237 lb (107.5 kg), SpO2 96 %.   General: No apparent distress alert and oriented x3 mood and affect normal, dressed appropriately.  HEENT: Pupils equal, extraocular movements intact  Respiratory: Patient's speak in full sentences and does not appear short of breath  Cardiovascular: No lower extremity edema, non tender, no erythema  Neuro: Cranial nerves II through XII are intact, neurovascularly intact in all extremities with 2+ DTRs and 2+ pulses.  Gait abnormal gait.   MSK:   Knee: Bilateral valgus deformity noted. Abnormal thigh to calf ratio.  Tender to palpation over medial and PF joint line.  ROM full in flexion and extension and lower leg rotation. instability with valgus force.  painful patellar compression. Patellar glide with moderate crepitus. Patellar and quadriceps tendons unremarkable. Hamstring and quadriceps strength is normal.   After informed written and verbal consent, patient was seated on exam table. Right knee was prepped with alcohol swab and utilizing anterolateral approach, patient's right knee space was injected with 40 mg per 5 mL of Synvisc (sodium hyaluronate) in a prefilled syringe was injected easily into the knee through a 22-gauge needle.  Patient tolerated the procedure well without immediate complications.  After informed written and verbal consent, patient was seated on exam table. Left knee was prepped with  alcohol swab and utilizing anterolateral approach, patient's left knee space was injected with 48 mg/5 mL of Synvisc (sodium hyaluronate) in a prefilled syringe was injected easily into the knee through a 22-gauge needle.  Patient tolerated the procedure well without immediate complications.    Impression and Recommendations:     The above documentation has been reviewed and is accurate and complete Christopher Pulley, DO       Note: This dictation was prepared with Dragon dictation along with smaller phrase technology. Any transcriptional errors that result from this process are unintentional.

## 2019-10-17 NOTE — Patient Instructions (Addendum)
Good to see you  Get x ray on your way out for right shoulder If not feeling better we will get you set up with physical therapy Next step for shoulder may be injections Received Gel injections in both knees today  See me again in 6-8 weeks

## 2019-10-18 ENCOUNTER — Encounter: Payer: Self-pay | Admitting: Family Medicine

## 2019-10-18 NOTE — Assessment & Plan Note (Signed)
Viscosupplementation given today.  Responded well to steroid injections for some time and then started having breakthrough.  Now increasing discomfort again.  Hopefully this will make improvement with this chronic problem with exacerbation.  Increase activity slowly.  Follow-up again 4 to 8 weeks.

## 2019-10-26 ENCOUNTER — Other Ambulatory Visit: Payer: Self-pay | Admitting: Family Medicine

## 2019-11-06 ENCOUNTER — Other Ambulatory Visit: Payer: Self-pay | Admitting: Family Medicine

## 2019-11-06 DIAGNOSIS — Z09 Encounter for follow-up examination after completed treatment for conditions other than malignant neoplasm: Secondary | ICD-10-CM | POA: Diagnosis not present

## 2019-11-13 ENCOUNTER — Other Ambulatory Visit: Payer: Self-pay | Admitting: Family Medicine

## 2019-11-19 ENCOUNTER — Other Ambulatory Visit: Payer: Self-pay

## 2019-11-19 ENCOUNTER — Telehealth: Payer: Self-pay | Admitting: *Deleted

## 2019-11-19 DIAGNOSIS — M25511 Pain in right shoulder: Secondary | ICD-10-CM

## 2019-11-19 DIAGNOSIS — G8929 Other chronic pain: Secondary | ICD-10-CM

## 2019-11-19 NOTE — Telephone Encounter (Signed)
Referral sent. Patient scheduled for appointment in 4 weeks. Talked to patient and he voices understanding.

## 2019-11-19 NOTE — Telephone Encounter (Signed)
Pt called stating that he's still having trouble with his R shoulder. At the last OV he was given some exercises to do and he said he has been able to do all but a couple. He would like to discuss the exercises he's unable to do and then if Dr. Tamala Julian thinks it's necessary he would like to go to PT @ [facility].

## 2019-11-19 NOTE — Telephone Encounter (Signed)
Patient states he is still having issues with his shoulder and not making much progress with home exercises. I instructed him on which exercises are giving him issues and told him to discontinue due to difficulty completing the exercises safely. Patient wants to know if PT at [facility] is a good choice or any suggestions on what facility would be best. If so I will put in referral and call patient with information.

## 2019-11-22 ENCOUNTER — Telehealth: Payer: Self-pay | Admitting: Family Medicine

## 2019-11-22 NOTE — Telephone Encounter (Signed)
Patient is calling and wanted to let Dr. Ethelene Hal know that he did received both vaccines and he took the [facility]. CB is [phone]

## 2019-11-23 DIAGNOSIS — M25511 Pain in right shoulder: Secondary | ICD-10-CM | POA: Diagnosis not present

## 2019-11-23 DIAGNOSIS — G8929 Other chronic pain: Secondary | ICD-10-CM | POA: Diagnosis not present

## 2019-11-23 DIAGNOSIS — M25611 Stiffness of right shoulder, not elsewhere classified: Secondary | ICD-10-CM | POA: Diagnosis not present

## 2019-11-23 NOTE — Telephone Encounter (Signed)
Recorded in patients record

## 2019-11-26 NOTE — Telephone Encounter (Signed)
Pt was notified and scheduled a CPE 12/17/2019.

## 2019-11-27 DIAGNOSIS — L720 Epidermal cyst: Secondary | ICD-10-CM | POA: Diagnosis not present

## 2019-11-28 DIAGNOSIS — M25511 Pain in right shoulder: Secondary | ICD-10-CM | POA: Diagnosis not present

## 2019-11-28 DIAGNOSIS — M25611 Stiffness of right shoulder, not elsewhere classified: Secondary | ICD-10-CM | POA: Diagnosis not present

## 2019-11-28 DIAGNOSIS — G8929 Other chronic pain: Secondary | ICD-10-CM | POA: Diagnosis not present

## 2019-11-29 DIAGNOSIS — G8929 Other chronic pain: Secondary | ICD-10-CM | POA: Diagnosis not present

## 2019-11-29 DIAGNOSIS — M25511 Pain in right shoulder: Secondary | ICD-10-CM | POA: Diagnosis not present

## 2019-11-29 DIAGNOSIS — M25611 Stiffness of right shoulder, not elsewhere classified: Secondary | ICD-10-CM | POA: Diagnosis not present

## 2019-12-01 DIAGNOSIS — G8929 Other chronic pain: Secondary | ICD-10-CM | POA: Diagnosis not present

## 2019-12-01 DIAGNOSIS — M25511 Pain in right shoulder: Secondary | ICD-10-CM | POA: Diagnosis not present

## 2019-12-01 DIAGNOSIS — M25611 Stiffness of right shoulder, not elsewhere classified: Secondary | ICD-10-CM | POA: Diagnosis not present

## 2019-12-02 ENCOUNTER — Other Ambulatory Visit: Payer: Self-pay | Admitting: Family Medicine

## 2019-12-03 DIAGNOSIS — M25511 Pain in right shoulder: Secondary | ICD-10-CM | POA: Diagnosis not present

## 2019-12-03 DIAGNOSIS — G8929 Other chronic pain: Secondary | ICD-10-CM | POA: Diagnosis not present

## 2019-12-03 DIAGNOSIS — M25611 Stiffness of right shoulder, not elsewhere classified: Secondary | ICD-10-CM | POA: Diagnosis not present

## 2019-12-04 ENCOUNTER — Other Ambulatory Visit: Payer: Self-pay | Admitting: Family Medicine

## 2019-12-05 ENCOUNTER — Ambulatory Visit: Payer: Medicare HMO | Admitting: Family Medicine

## 2019-12-05 DIAGNOSIS — M25511 Pain in right shoulder: Secondary | ICD-10-CM | POA: Diagnosis not present

## 2019-12-05 DIAGNOSIS — G8929 Other chronic pain: Secondary | ICD-10-CM | POA: Diagnosis not present

## 2019-12-05 DIAGNOSIS — M25611 Stiffness of right shoulder, not elsewhere classified: Secondary | ICD-10-CM | POA: Diagnosis not present

## 2019-12-08 DIAGNOSIS — G8929 Other chronic pain: Secondary | ICD-10-CM | POA: Diagnosis not present

## 2019-12-08 DIAGNOSIS — M25511 Pain in right shoulder: Secondary | ICD-10-CM | POA: Diagnosis not present

## 2019-12-08 DIAGNOSIS — M25611 Stiffness of right shoulder, not elsewhere classified: Secondary | ICD-10-CM | POA: Diagnosis not present

## 2019-12-11 DIAGNOSIS — G8929 Other chronic pain: Secondary | ICD-10-CM | POA: Diagnosis not present

## 2019-12-11 DIAGNOSIS — M25611 Stiffness of right shoulder, not elsewhere classified: Secondary | ICD-10-CM | POA: Diagnosis not present

## 2019-12-11 DIAGNOSIS — M25511 Pain in right shoulder: Secondary | ICD-10-CM | POA: Diagnosis not present

## 2019-12-12 DIAGNOSIS — M25511 Pain in right shoulder: Secondary | ICD-10-CM | POA: Diagnosis not present

## 2019-12-12 DIAGNOSIS — M25611 Stiffness of right shoulder, not elsewhere classified: Secondary | ICD-10-CM | POA: Diagnosis not present

## 2019-12-12 DIAGNOSIS — G8929 Other chronic pain: Secondary | ICD-10-CM | POA: Diagnosis not present

## 2019-12-15 DIAGNOSIS — G8929 Other chronic pain: Secondary | ICD-10-CM | POA: Diagnosis not present

## 2019-12-15 DIAGNOSIS — M25611 Stiffness of right shoulder, not elsewhere classified: Secondary | ICD-10-CM | POA: Diagnosis not present

## 2019-12-15 DIAGNOSIS — M25511 Pain in right shoulder: Secondary | ICD-10-CM | POA: Diagnosis not present

## 2019-12-17 ENCOUNTER — Other Ambulatory Visit: Payer: Self-pay | Admitting: Family Medicine

## 2019-12-17 ENCOUNTER — Ambulatory Visit: Payer: Self-pay

## 2019-12-17 ENCOUNTER — Encounter: Payer: Self-pay | Admitting: Family Medicine

## 2019-12-17 ENCOUNTER — Ambulatory Visit (INDEPENDENT_AMBULATORY_CARE_PROVIDER_SITE_OTHER): Payer: Medicare HMO | Admitting: Family Medicine

## 2019-12-17 ENCOUNTER — Other Ambulatory Visit: Payer: Self-pay

## 2019-12-17 VITALS — BP 122/70 | HR 77 | Temp 97.3°F | Ht 73.0 in | Wt 237.2 lb

## 2019-12-17 VITALS — BP 120/82 | HR 91 | Ht 73.0 in | Wt 238.0 lb

## 2019-12-17 DIAGNOSIS — I10 Essential (primary) hypertension: Secondary | ICD-10-CM | POA: Diagnosis not present

## 2019-12-17 DIAGNOSIS — Z0000 Encounter for general adult medical examination without abnormal findings: Secondary | ICD-10-CM

## 2019-12-17 DIAGNOSIS — M7551 Bursitis of right shoulder: Secondary | ICD-10-CM | POA: Diagnosis not present

## 2019-12-17 DIAGNOSIS — G4700 Insomnia, unspecified: Secondary | ICD-10-CM | POA: Diagnosis not present

## 2019-12-17 DIAGNOSIS — E559 Vitamin D deficiency, unspecified: Secondary | ICD-10-CM | POA: Diagnosis not present

## 2019-12-17 DIAGNOSIS — F419 Anxiety disorder, unspecified: Secondary | ICD-10-CM

## 2019-12-17 DIAGNOSIS — H6121 Impacted cerumen, right ear: Secondary | ICD-10-CM | POA: Diagnosis not present

## 2019-12-17 DIAGNOSIS — G8929 Other chronic pain: Secondary | ICD-10-CM

## 2019-12-17 DIAGNOSIS — M25511 Pain in right shoulder: Secondary | ICD-10-CM

## 2019-12-17 DIAGNOSIS — R69 Illness, unspecified: Secondary | ICD-10-CM

## 2019-12-17 DIAGNOSIS — Z125 Encounter for screening for malignant neoplasm of prostate: Secondary | ICD-10-CM | POA: Insufficient documentation

## 2019-12-17 DIAGNOSIS — Z8546 Personal history of malignant neoplasm of prostate: Secondary | ICD-10-CM | POA: Diagnosis not present

## 2019-12-17 DIAGNOSIS — E785 Hyperlipidemia, unspecified: Secondary | ICD-10-CM | POA: Diagnosis not present

## 2019-12-17 DIAGNOSIS — Z23 Encounter for immunization: Secondary | ICD-10-CM | POA: Diagnosis not present

## 2019-12-17 DIAGNOSIS — M25611 Stiffness of right shoulder, not elsewhere classified: Secondary | ICD-10-CM | POA: Diagnosis not present

## 2019-12-17 LAB — URINALYSIS, ROUTINE W REFLEX MICROSCOPIC
Bilirubin Urine: NEGATIVE
Hgb urine dipstick: NEGATIVE
Ketones, ur: NEGATIVE
Leukocytes,Ua: NEGATIVE
Nitrite: NEGATIVE
RBC / HPF: NONE SEEN (ref 0–?)
Specific Gravity, Urine: 1.02 (ref 1.000–1.030)
Total Protein, Urine: NEGATIVE
Urine Glucose: NEGATIVE
Urobilinogen, UA: 0.2 (ref 0.0–1.0)
WBC, UA: NONE SEEN (ref 0–?)
pH: 6.5 (ref 5.0–8.0)

## 2019-12-17 LAB — COMPREHENSIVE METABOLIC PANEL
ALT: 34 U/L (ref 0–53)
AST: 27 U/L (ref 0–37)
Albumin: 4.4 g/dL (ref 3.5–5.2)
Alkaline Phosphatase: 93 U/L (ref 39–117)
BUN: 18 mg/dL (ref 6–23)
CO2: 25 mEq/L (ref 19–32)
Calcium: 9.5 mg/dL (ref 8.4–10.5)
Chloride: 101 mEq/L (ref 96–112)
Creatinine, Ser: 0.91 mg/dL (ref 0.40–1.50)
GFR: 85.45 mL/min (ref >60.00)
Glucose, Bld: 95 mg/dL (ref 70–99)
Potassium: 4.8 mEq/L (ref 3.5–5.1)
Sodium: 135 mEq/L (ref 135–145)
Total Bilirubin: 0.6 mg/dL (ref 0.2–1.2)
Total Protein: 6.7 g/dL (ref 6.0–8.3)

## 2019-12-17 LAB — CBC
HCT: 44.1 % (ref 39.0–52.0)
Hemoglobin: 14.8 g/dL (ref 13.0–17.0)
MCHC: 33.7 g/dL (ref 30.0–36.0)
MCV: 89.2 fl (ref 78.0–100.0)
Platelets: 183 10*3/uL (ref 150.0–400.0)
RBC: 4.94 Mil/uL (ref 4.22–5.81)
RDW: 13.5 % (ref 11.5–15.5)
WBC: 6.9 10*3/uL (ref 4.0–10.5)

## 2019-12-17 LAB — LIPID PANEL
Cholesterol: 147 mg/dL (ref 0–200)
HDL: 49.9 mg/dL (ref >39.00)
LDL Cholesterol: 81 mg/dL (ref 0–99)
NonHDL: 97.16
Total CHOL/HDL Ratio: 3
Triglycerides: 80 mg/dL (ref 0.0–149.0)
VLDL: 16 mg/dL (ref 0.0–40.0)

## 2019-12-17 LAB — PSA: PSA: 0 ng/mL — ABNORMAL LOW (ref 0.10–4.00)

## 2019-12-17 LAB — VITAMIN D 25 HYDROXY (VIT D DEFICIENCY, FRACTURES): VITD: 22.4 ng/mL — ABNORMAL LOW (ref 30.00–100.00)

## 2019-12-17 MED ORDER — DEBROX 6.5 % OT SOLN: 5.0000 [drp] | Freq: Two times a day (BID) | OTIC | 0 refills | Status: DC

## 2019-12-17 NOTE — Assessment & Plan Note (Signed)
Patient does have signs and symptoms that is consistent still with bursitis and also potential labral pathology.  Has made improvement patient is feeling like the physical therapy is helpful.  We discussed the potential for injection the patient would like to hold at this time but will consider it again at follow-up in 4 to 6 weeks.  Patient will watch for any other symptoms that could be contributing or any worsening of the weakness.

## 2019-12-17 NOTE — Patient Instructions (Signed)
Keep going with PT Bicep tendonitis with questionable labral tear  We will give it another 6 weeks and if worse injection

## 2019-12-17 NOTE — Progress Notes (Signed)
Acute Office Visit  Subjective:    Patient ID: Christopher Gilham., male    DOB: [birth year], 69 y.o.   MRN: [id number]  Chief Complaint  Patient presents with  . Annual Exam    CPE, restless at night.     HPI Patient is in today for physical exam and follow-up of hypertension, dyslipidemia, anxiety and insomnia.  Blood pressure is well controlled with losartan HCTZ.  Continues atorvastatin 40 mg for elevated LDL cholesterol.  Using alprazolam twice daily on a regular basis for anxiety.  This is an ongoing issue for him.  He is hesitant to drive in heavy traffic arrival letter stating because of this.  Recently he has been dealing with right shoulder bursitis and an inclusion cyst.  Both are resolving.  However they have affected his sleep.  He has been having some issues with nocturia.  He is status post prostatectomy  Past Medical History:  Diagnosis Date  . BENIGN PROSTATIC HYPERTROPHY 07/28/2006  . GERD 07/28/2006  . HYPERLIPIDEMIA 07/28/2006  . NEOPLASM, MALIGNANT, PROSTATE 08/01/2007  . NISSEN FUNDOPLICATION, HX OF 2/35/3614  . Unspecified essential hypertension 03/27/2007    Past Surgical History:  Procedure Laterality Date  . NISSEN FUNDOPLICATION  4315  . PROSTATECTOMY  2009  . TONSILLECTOMY      Family History  Problem Relation Age of Onset  . Heart disease Mother   . Prostate cancer Father        prostate  . Colon cancer Neg Hx     Social History   Socioeconomic History  . Marital status: Married    Spouse name: Not on file  . Number of children: Not on file  . Years of education: Not on file  . Highest education level: Not on file  Occupational History  . Not on file  Tobacco Use  . Smoking status: Former Research scientist (life sciences)  . Smokeless tobacco: Never Used  . Tobacco comment: smoked in college  Substance and Sexual Activity  . Alcohol use: Yes    Alcohol/week: 0.0 standard drinks    Comment: rarely; once or twice yearly  . Drug use: No  . Sexual activity: Not on  file  Other Topics Concern  . Not on file  Social History Narrative  . Not on file   Social Determinants of Health   Financial Resource Strain:   . Difficulty of Paying Living Expenses: Not on file  Food Insecurity:   . Worried About Charity fundraiser in the Last Year: Not on file  . Ran Out of Food in the Last Year: Not on file  Transportation Needs:   . Lack of Transportation (Medical): Not on file  . Lack of Transportation (Non-Medical): Not on file  Physical Activity:   . Days of Exercise per Week: Not on file  . Minutes of Exercise per Session: Not on file  Stress:   . Feeling of Stress : Not on file  Social Connections:   . Frequency of Communication with Friends and Family: Not on file  . Frequency of Social Gatherings with Friends and Family: Not on file  . Attends Religious Services: Not on file  . Active Member of Clubs or Organizations: Not on file  . Attends Archivist Meetings: Not on file  . Marital Status: Not on file  Intimate Partner Violence:   . Fear of Current or Ex-Partner: Not on file  . Emotionally Abused: Not on file  . Physically Abused: Not on file  .  Sexually Abused: Not on file    Outpatient Medications Prior to Visit  Medication Sig Dispense Refill  . ALPRAZolam (XANAX) 0.5 MG tablet TAKE 1 TABLET BY MOUTH TWICE A DAY AS NEEDED 60 tablet 0  . atorvastatin (LIPITOR) 40 MG tablet TAKE 1 TABLET BY MOUTH EVERY DAY 90 tablet 0  . cholecalciferol (VITAMIN D) 1000 units tablet Take 2,000 Units by mouth daily.    Marland Kitchen losartan-hydrochlorothiazide (HYZAAR) 100-12.5 MG tablet TAKE 1 TABLET BY MOUTH EVERY DAY 30 tablet 1  . Turmeric 500 MG CAPS Take 2 capsules by mouth daily.    Marland Kitchen sulfamethoxazole-trimethoprim (BACTRIM) 400-80 MG tablet Take 1 tablet by mouth 2 (two) times daily. (Patient not taking: Reported on 12/17/2019)     No facility-administered medications prior to visit.    No Known Allergies  Review of Systems  Constitutional:  Negative.   HENT: Negative.   Eyes: Negative for photophobia and visual disturbance.  Respiratory: Negative.   Cardiovascular: Negative.   Gastrointestinal: Negative.   Endocrine: Negative for polyphagia and polyuria.  Genitourinary: Negative.   Musculoskeletal: Negative.   Skin: Negative for pallor and rash.  Allergic/Immunologic: Negative for immunocompromised state.  Neurological: Negative for light-headedness and numbness.  Hematological: Does not bruise/bleed easily.  Psychiatric/Behavioral: Positive for sleep disturbance. The patient is nervous/anxious.        Objective:    Physical Exam Vitals and nursing note reviewed.  Constitutional:      General: He is not in acute distress.    Appearance: Normal appearance. He is not ill-appearing, toxic-appearing or diaphoretic.  HENT:     Head: Normocephalic and atraumatic.     Right Ear: External ear normal. A foreign body is present.     Left Ear: Tympanic membrane, ear canal and external ear normal.     Mouth/Throat:     Mouth: Mucous membranes are moist.     Pharynx: Oropharynx is clear. No oropharyngeal exudate or posterior oropharyngeal erythema.  Eyes:     General: No scleral icterus.       Right eye: No discharge.        Left eye: No discharge.     Conjunctiva/sclera: Conjunctivae normal.     Pupils: Pupils are equal, round, and reactive to light.  Cardiovascular:     Rate and Rhythm: Normal rate and regular rhythm.  Pulmonary:     Effort: Pulmonary effort is normal.     Breath sounds: Normal breath sounds.  Abdominal:     General: Bowel sounds are normal. There is no distension.     Palpations: There is no mass.     Tenderness: There is no abdominal tenderness. There is no guarding or rebound.     Hernia: A hernia is present. Hernia is present in the umbilical area and ventral area.  Musculoskeletal:     Cervical back: No rigidity or tenderness.     Right lower leg: No edema.     Left lower leg: No edema.    Lymphadenopathy:     Cervical: No cervical adenopathy.  Skin:    General: Skin is warm and dry.  Neurological:     Mental Status: He is alert and oriented to person, place, and time.  Psychiatric:        Mood and Affect: Mood normal.        Behavior: Behavior normal.     BP 122/70   Pulse 77   Temp (!) 97.3 F (36.3 C) (Tympanic)   Ht 6\' 1"  (1.854  m)   Wt 237 lb 3.2 oz (107.6 kg)   SpO2 95%   BMI 31.29 kg/m  Wt Readings from Last 3 Encounters:  12/17/19 237 lb 3.2 oz (107.6 kg)  10/17/19 237 lb (107.5 kg)  08/22/19 242 lb (109.8 kg)    Health Maintenance Due  Topic Date Due  . Hepatitis C Screening  Never done  . PNA vac Low Risk Adult (2 of 2 - PPSV23) 12/22/2016    There are no preventive care reminders to display for this patient.   Lab Results  Component Value Date   TSH 1.29 01/05/2017   Lab Results  Component Value Date   WBC 7.7 12/01/2018   HGB 15.2 12/01/2018   HCT 45.4 12/01/2018   MCV 89.6 12/01/2018   PLT 134.0 (L) 12/01/2018   Lab Results  Component Value Date   NA 135 12/01/2018   K 5.1 12/01/2018   CO2 26 12/01/2018   GLUCOSE 92 12/01/2018   BUN 18 12/01/2018   CREATININE 0.86 12/01/2018   BILITOT 0.8 12/01/2018   ALKPHOS 87 12/01/2018   AST 21 12/01/2018   ALT 22 12/01/2018   PROT 6.4 12/01/2018   ALBUMIN 4.5 12/01/2018   CALCIUM 9.7 12/01/2018   GFR 88.34 12/01/2018   Lab Results  Component Value Date   CHOL 134 12/01/2018   Lab Results  Component Value Date   HDL 59.40 12/01/2018   Lab Results  Component Value Date   LDLCALC 62 12/01/2018   Lab Results  Component Value Date   TRIG 59.0 12/01/2018   Lab Results  Component Value Date   CHOLHDL 2 12/01/2018   No results found for: HGBA1C     Assessment & Plan:   Problem List Items Addressed This Visit      Cardiovascular and Mediastinum   Essential hypertension   Relevant Orders   CBC   Comprehensive metabolic panel   Urinalysis, Routine w reflex  microscopic     Nervous and Auditory   Ceruminosis, right   Relevant Medications   carbamide peroxide (DEBROX) 6.5 % OTIC solution     Other   Dyslipidemia   Relevant Orders   Comprehensive metabolic panel   Lipid panel   Anxiety - Primary   History of prostate cancer   Relevant Orders   PSA   Healthcare maintenance   Vitamin D deficiency   Relevant Orders   VITAMIN D 25 Hydroxy (Vit-D Deficiency, Fractures)   Taking medication for chronic disease   Relevant Orders   Urine drugs of abuse scrn w alc, routine ([facility])   Need for influenza vaccination   Relevant Orders   Flu Vaccine QUAD High Dose(Fluad) (Completed)   Insomnia       Meds ordered this encounter  Medications  . carbamide peroxide (DEBROX) 6.5 % OTIC solution    Sig: Place 5 drops into the right ear 2 (two) times daily.    Dispense:  15 mL    Refill:  0   Given information on health maintenance.  Urged him to stay active and try to lose some weight.  He expressed interest in nutrition counseling once he shoulder is healed.  Advised that his sleep should improve after his shoulder and inclusion cyst are healed.  He may return as needed.  Libby Maw, MD

## 2019-12-17 NOTE — Progress Notes (Signed)
Christ[facility] [name] [facility] Peck Phone: [phone] Subjective:   Fontaine No, am serving as a scribe for Dr. Hulan Saas. This visit occurred during the SARS-CoV-2 public health emergency.  Safety protocols were in place, including screening questions prior to the visit, additional usage of staff PPE, and extensive cleaning of exam room while observing appropriate contact time as indicated for disinfecting solutions.   I'm seeing this patient by the request  of:  Libby Maw, MD  CC: Right shoulder pain follow-up  [id number]   10/17/2019 IMPRESSION: No acute osseous abnormality. Minimal glenohumeral degenerative changes.    Update 12/17/2019 [name] is a 69 y.o. male coming in with complaint of right shoulder pain. Last visit patient was seen for visco for both knees with discussion of shoulder injection at next visit. Patient states that his pain is almost constant. Patient has been doing physical therapy for shoulder, 3x a week.        Past Medical History:  Diagnosis Date  . BENIGN PROSTATIC HYPERTROPHY 07/28/2006  . GERD 07/28/2006  . HYPERLIPIDEMIA 07/28/2006  . NEOPLASM, MALIGNANT, PROSTATE 08/01/2007  . NISSEN FUNDOPLICATION, HX OF 2/40/9735  . Unspecified essential hypertension 03/27/2007   Past Surgical History:  Procedure Laterality Date  . NISSEN FUNDOPLICATION  3299  . PROSTATECTOMY  2009  . TONSILLECTOMY     Social History   Socioeconomic History  . Marital status: Married    Spouse name: Not on file  . Number of children: Not on file  . Years of education: Not on file  . Highest education level: Not on file  Occupational History  . Not on file  Tobacco Use  . Smoking status: Former Research scientist (life sciences)  . Smokeless tobacco: Never Used  . Tobacco comment: smoked in college  Substance and Sexual Activity  . Alcohol use: Yes    Alcohol/week: 0.0 standard drinks    Comment: rarely; once  or twice yearly  . Drug use: No  . Sexual activity: Not on file  Other Topics Concern  . Not on file  Social History Narrative  . Not on file   Social Determinants of Health   Financial Resource Strain:   . Difficulty of Paying Living Expenses: Not on file  Food Insecurity:   . Worried About Charity fundraiser in the Last Year: Not on file  . Ran Out of Food in the Last Year: Not on file  Transportation Needs:   . Lack of Transportation (Medical): Not on file  . Lack of Transportation (Non-Medical): Not on file  Physical Activity:   . Days of Exercise per Week: Not on file  . Minutes of Exercise per Session: Not on file  Stress:   . Feeling of Stress : Not on file  Social Connections:   . Frequency of Communication with Friends and Family: Not on file  . Frequency of Social Gatherings with Friends and Family: Not on file  . Attends Religious Services: Not on file  . Active Member of Clubs or Organizations: Not on file  . Attends Archivist Meetings: Not on file  . Marital Status: Not on file   No Known Allergies Family History  Problem Relation Age of Onset  . Heart disease Mother   . Prostate cancer Father        prostate  . Colon cancer Neg Hx      Current Outpatient Medications (Cardiovascular):  .  atorvastatin (LIPITOR)  40 MG tablet, TAKE 1 TABLET BY MOUTH EVERY DAY .  losartan-hydrochlorothiazide (HYZAAR) 100-12.5 MG tablet, TAKE 1 TABLET BY MOUTH EVERY DAY     Current Outpatient Medications (Other):  Marland Kitchen  ALPRAZolam (XANAX) 0.5 MG tablet, TAKE 1 TABLET BY MOUTH TWICE A DAY AS NEEDED .  carbamide peroxide (DEBROX) 6.5 % OTIC solution, Place 5 drops into the right ear 2 (two) times daily. .  cholecalciferol (VITAMIN D) 1000 units tablet, Take 2,000 Units by mouth daily. Marland Kitchen  sulfamethoxazole-trimethoprim (BACTRIM) 400-80 MG tablet, Take 1 tablet by mouth 2 (two) times daily.  .  Turmeric 500 MG CAPS, Take 2 capsules by mouth daily.   Reviewed prior  external information including notes and imaging from  primary care provider As well as notes that were available from care everywhere and other healthcare systems.  Past medical history, social, surgical and family history all reviewed in electronic medical record.  No pertanent information unless stated regarding to the chief complaint.   Review of Systems:  No headache, visual changes, nausea, vomiting, diarrhea, constipation, dizziness, abdominal pain, skin rash, fevers, chills, night sweats, weight loss, swollen lymph nodes, body aches, joint swelling, chest pain, shortness of breath, mood changes. POSITIVE muscle aches  Objective  Blood pressure 120/82, pulse 91, height 6\' 1"  (1.854 m), weight 238 lb (108 kg), SpO2 98 %.   General: No apparent distress alert and oriented x3 mood and affect normal, dressed appropriately.  HEENT: Pupils equal, extraocular movements intact  Respiratory: Patient's speak in full sentences and does not appear short of breath  Cardiovascular: No lower extremity edema, non tender, no erythema  Neuro: Cranial nerves II through XII are intact, neurovascularly intact in all extremities with 2+ DTRs and 2+ pulses.  Gait normal with good balance and coordination.  MSK: Right shoulder exam shows the patient does have a positive impingement.  Patient active range of motion of 95 degrees of full range of motion passively.  Positive impingement Neer and Hawkins.  Limited musculoskeletal ultrasound was performed and interpreted by Lyndal Pulley  Limited ultrasound of patient's right shoulder shows that patient does have a reactive subacromial bursitis.  Some very mild degenerative changes but no true acute tear of the rotator cuff.  Hypoechoic changes within the bicep tendon sheath as well as some degenerative changes of the anterior and posterior labrum noted. Impression: Questionable bicep tendinitis with a labral tear pathology    Impression and Recommendations:      The above documentation has been reviewed and is accurate and complete Lyndal Pulley, DO

## 2019-12-17 NOTE — Patient Instructions (Addendum)
Health Maintenance After Age 69 After age 69, you are at a higher risk for certain long-term diseases and infections as well as injuries from falls. Falls are a major cause of broken bones and head injuries in people who are older than age 69. Getting regular preventive care can help to keep you healthy and well. Preventive care includes getting regular testing and making lifestyle changes as recommended by your health care provider. Talk with your health care provider about:  Which screenings and tests you should have. A screening is a test that checks for a disease when you have no symptoms.  A diet and exercise plan that is right for you. What should I know about screenings and tests to prevent falls? Screening and testing are the best ways to find a health problem early. Early diagnosis and treatment give you the best chance of managing medical conditions that are common after age 69. Certain conditions and lifestyle choices may make you more likely to have a fall. Your health care provider may recommend:  Regular vision checks. Poor vision and conditions such as cataracts can make you more likely to have a fall. If you wear glasses, make sure to get your prescription updated if your vision changes.  Medicine review. Work with your health care provider to regularly review all of the medicines you are taking, including over-the-counter medicines. Ask your health care provider about any side effects that may make you more likely to have a fall. Tell your health care provider if any medicines that you take make you feel dizzy or sleepy.  Osteoporosis screening. Osteoporosis is a condition that causes the bones to get weaker. This can make the bones weak and cause them to break more easily.  Blood pressure screening. Blood pressure changes and medicines to control blood pressure can make you feel dizzy.  Strength and balance checks. Your health care provider may recommend certain tests to check your  strength and balance while standing, walking, or changing positions.  Foot health exam. Foot pain and numbness, as well as not wearing proper footwear, can make you more likely to have a fall.  Depression screening. You may be more likely to have a fall if you have a fear of falling, feel emotionally low, or feel unable to do activities that you used to do.  Alcohol use screening. Using too much alcohol can affect your balance and may make you more likely to have a fall. What actions can I take to lower my risk of falls? General instructions  Talk with your health care provider about your risks for falling. Tell your health care provider if: ? You fall. Be sure to tell your health care provider about all falls, even ones that seem minor. ? You feel dizzy, sleepy, or off-balance.  Take over-the-counter and prescription medicines only as told by your health care provider. These include any supplements.  Eat a healthy diet and maintain a healthy weight. A healthy diet includes low-fat dairy products, low-fat (lean) meats, and fiber from whole grains, beans, and lots of fruits and vegetables. Home safety  Remove any tripping hazards, such as rugs, cords, and clutter.  Install safety equipment such as grab bars in bathrooms and safety rails on stairs.  Keep rooms and walkways well-lit. Activity   Follow a regular exercise program to stay fit. This will help you maintain your balance. Ask your health care provider what types of exercise are appropriate for you.  If you need a cane or   walker, use it as recommended by your health care provider.  Wear supportive shoes that have nonskid soles. Lifestyle  Do not drink alcohol if your health care provider tells you not to drink.  If you drink alcohol, limit how much you have: ? 0-1 drink a day for women. ? 0-2 drinks a day for men.  Be aware of how much alcohol is in your drink. In the [place] one drink equals one typical bottle of beer (12  oz), one-half glass of wine (5 oz), or one shot of hard liquor (1 oz).  Do not use any products that contain nicotine or tobacco, such as cigarettes and e-cigarettes. If you need help quitting, ask your health care provider. Summary  Having a healthy lifestyle and getting preventive care can help to protect your health and wellness after age 60.  Screening and testing are the best way to find a health problem early and help you avoid having a fall. Early diagnosis and treatment give you the best chance for managing medical conditions that are more common for people who are older than age 12.  Falls are a major cause of broken bones and head injuries in people who are older than age 46. Take precautions to prevent a fall at home.  Work with your health care provider to learn what changes you can make to improve your health and wellness and to prevent falls. This information is not intended to replace advice given to you by your health care provider. Make sure you discuss any questions you have with your health care provider. Document Revised: 06/15/2018 Document Reviewed: 01/05/2017 Elsevier Patient Education  [place].  Insomnia Insomnia is a sleep disorder that makes it difficult to fall asleep or stay asleep. Insomnia can cause fatigue, low energy, difficulty concentrating, mood swings, and poor performance at work or school. There are three different ways to classify insomnia:  Difficulty falling asleep.  Difficulty staying asleep.  Waking up too early in the morning. Any type of insomnia can be long-term (chronic) or short-term (acute). Both are common. Short-term insomnia usually lasts for three months or less. Chronic insomnia occurs at least three times a week for longer than three months. What are the causes? Insomnia may be caused by another condition, situation, or substance, such as:  Anxiety.  Certain medicines.  Gastroesophageal reflux disease (GERD) or other  gastrointestinal conditions.  Asthma or other breathing conditions.  Restless legs syndrome, sleep apnea, or other sleep disorders.  Chronic pain.  Menopause.  Stroke.  Abuse of alcohol, tobacco, or illegal drugs.  Mental health conditions, such as depression.  Caffeine.  Neurological disorders, such as Alzheimer's disease.  An overactive thyroid (hyperthyroidism). Sometimes, the cause of insomnia may not be known. What increases the risk? Risk factors for insomnia include:  Gender. Women are affected more often than men.  Age. Insomnia is more common as you get older.  Stress.  Lack of exercise.  Irregular work schedule or working night shifts.  Traveling between different time zones.  Certain medical and mental health conditions. What are the signs or symptoms? If you have insomnia, the main symptom is having trouble falling asleep or having trouble staying asleep. This may lead to other symptoms, such as:  Feeling fatigued or having low energy.  Feeling nervous about going to sleep.  Not feeling rested in the morning.  Having trouble concentrating.  Feeling irritable, anxious, or depressed. How is this diagnosed? This condition may be diagnosed based on:  Your symptoms and medical history. Your health care provider may ask about: ? Your sleep habits. ? Any medical conditions you have. ? Your mental health.  A physical exam. How is this treated? Treatment for insomnia depends on the cause. Treatment may focus on treating an underlying condition that is causing insomnia. Treatment may also include:  Medicines to help you sleep.  Counseling or therapy.  Lifestyle adjustments to help you sleep better. Follow these instructions at home: Eating and drinking   Limit or avoid alcohol, caffeinated beverages, and cigarettes, especially close to bedtime. These can disrupt your sleep.  Do not eat a large meal or eat spicy foods right before bedtime.  This can lead to digestive discomfort that can make it hard for you to sleep. Sleep habits   Keep a sleep diary to help you and your health care provider figure out what could be causing your insomnia. Write down: ? When you sleep. ? When you wake up during the night. ? How well you sleep. ? How rested you feel the next day. ? Any side effects of medicines you are taking. ? What you eat and drink.  Make your bedroom a dark, comfortable place where it is easy to fall asleep. ? Put up shades or blackout curtains to block light from outside. ? Use a white noise machine to block noise. ? Keep the temperature cool.  Limit screen use before bedtime. This includes: ? Watching TV. ? Using your smartphone, tablet, or computer.  Stick to a routine that includes going to bed and waking up at the same times every day and night. This can help you fall asleep faster. Consider making a quiet activity, such as reading, part of your nighttime routine.  Try to avoid taking naps during the day so that you sleep better at night.  Get out of bed if you are still awake after 15 minutes of trying to sleep. Keep the lights down, but try reading or doing a quiet activity. When you feel sleepy, go back to bed. General instructions  Take over-the-counter and prescription medicines only as told by your health care provider.  Exercise regularly, as told by your health care provider. Avoid exercise starting several hours before bedtime.  Use relaxation techniques to manage stress. Ask your health care provider to suggest some techniques that may work well for you. These may include: ? Breathing exercises. ? Routines to release muscle tension. ? Visualizing peaceful scenes.  Make sure that you drive carefully. Avoid driving if you feel very sleepy.  Keep all follow-up visits as told by your health care provider. This is important. Contact a health care provider if:  You are tired throughout the  day.  You have trouble in your daily routine due to sleepiness.  You continue to have sleep problems, or your sleep problems get worse. Get help right away if:  You have serious thoughts about hurting yourself or someone else. If you ever feel like you may hurt yourself or others, or have thoughts about taking your own life, get help right away. You can go to your nearest emergency department or call:  Your local emergency services (911 in the [place]  A suicide crisis helpline, such as the [facility] at 367-518-3012. This is open 24 hours a day. Summary  Insomnia is a sleep disorder that makes it difficult to fall asleep or stay asleep.  Insomnia can be long-term (chronic) or short-term (acute).  Treatment for insomnia depends on  the cause. Treatment may focus on treating an underlying condition that is causing insomnia.  Keep a sleep diary to help you and your health care provider figure out what could be causing your insomnia. This information is not intended to replace advice given to you by your health care provider. Make sure you discuss any questions you have with your health care provider. Document Revised: 02/04/2017 Document Reviewed: 12/02/2016 Elsevier Patient Education  2020 [employer].

## 2019-12-19 DIAGNOSIS — G8929 Other chronic pain: Secondary | ICD-10-CM | POA: Diagnosis not present

## 2019-12-19 DIAGNOSIS — M25611 Stiffness of right shoulder, not elsewhere classified: Secondary | ICD-10-CM | POA: Diagnosis not present

## 2019-12-19 DIAGNOSIS — M25511 Pain in right shoulder: Secondary | ICD-10-CM | POA: Diagnosis not present

## 2019-12-21 LAB — URINE DRUGS OF ABUSE SCREEN W ALC, ROUTINE (REF LAB)
Amphetamines, Urine: NEGATIVE ng/mL
Barbiturate Quant, Ur: NEGATIVE ng/mL
Cannabinoid Quant, Ur: NEGATIVE ng/mL
Cocaine (Metab.): NEGATIVE ng/mL
Ethanol, Urine: NEGATIVE %
Methadone Screen, Urine: NEGATIVE ng/mL
Opiate Quant, Ur: NEGATIVE ng/mL
PCP Quant, Ur: NEGATIVE ng/mL
Propoxyphene: NEGATIVE ng/mL

## 2019-12-21 LAB — DRUG PROFILE 799031: BENZODIAZEPINES: NEGATIVE

## 2019-12-22 DIAGNOSIS — M25611 Stiffness of right shoulder, not elsewhere classified: Secondary | ICD-10-CM | POA: Diagnosis not present

## 2019-12-22 DIAGNOSIS — G8929 Other chronic pain: Secondary | ICD-10-CM | POA: Diagnosis not present

## 2019-12-22 DIAGNOSIS — M25511 Pain in right shoulder: Secondary | ICD-10-CM | POA: Diagnosis not present

## 2019-12-24 DIAGNOSIS — G8929 Other chronic pain: Secondary | ICD-10-CM | POA: Diagnosis not present

## 2019-12-24 DIAGNOSIS — M25511 Pain in right shoulder: Secondary | ICD-10-CM | POA: Diagnosis not present

## 2019-12-24 DIAGNOSIS — M25611 Stiffness of right shoulder, not elsewhere classified: Secondary | ICD-10-CM | POA: Diagnosis not present

## 2019-12-26 DIAGNOSIS — M25611 Stiffness of right shoulder, not elsewhere classified: Secondary | ICD-10-CM | POA: Diagnosis not present

## 2019-12-26 DIAGNOSIS — G8929 Other chronic pain: Secondary | ICD-10-CM | POA: Diagnosis not present

## 2019-12-26 DIAGNOSIS — M25511 Pain in right shoulder: Secondary | ICD-10-CM | POA: Diagnosis not present

## 2019-12-27 ENCOUNTER — Ambulatory Visit (INDEPENDENT_AMBULATORY_CARE_PROVIDER_SITE_OTHER): Payer: Medicare HMO

## 2019-12-27 VITALS — Ht 73.0 in | Wt 238.0 lb

## 2019-12-27 DIAGNOSIS — Z0000 Encounter for general adult medical examination without abnormal findings: Secondary | ICD-10-CM | POA: Diagnosis not present

## 2019-12-27 NOTE — Progress Notes (Signed)
Subjective:   Christopher [name]. is a 69 y.o. male who presents for Medicare Annual/Subsequent preventive examination.  I connected with Ammaar today by telephone and verified that I am speaking with the correct person using two identifiers. Location patient: home Location provider: work Persons participating in the virtual visit: patient, Marine scientist.    I discussed the limitations, risks, security and privacy concerns of performing an evaluation and management service by telephone and the availability of in person appointments. I also discussed with the patient that there may be a patient responsible charge related to this service. The patient expressed understanding and verbally consented to this telephonic visit.    Interactive audio and video telecommunications were attempted between this provider and patient, however failed, due to patient having technical difficulties OR patient did not have access to video capability.  We continued and completed visit with audio only.  Some vital signs may be absent or patient reported.   Time Spent with patient on telephone encounter: 20 minutes   Review of Systems     Cardiac Risk Factors include: advanced age (>78men, >73 women);male gender;hypertension;diabetes mellitus;obesity (BMI >30kg/m2)     Objective:    Today's Vitals   12/27/19 0900  Weight: 238 lb (108 kg)  Height: 6\' 1"  (1.854 m)   Body mass index is 31.4 kg/m.  Advanced Directives 12/27/2019 08/01/2015 08/22/2014  Does Patient Have a Medical Advance Directive? Yes Yes Yes  Type of Paramedic of [place];Living will [place];Living will [place];Living will  Does patient want to make changes to medical advance directive? - No - Patient declined -  Copy of [place] in Chart? No - copy requested No - copy requested -    Current Medications (verified) Outpatient Encounter Medications as of  12/27/2019  Medication Sig  . ALPRAZolam (XANAX) 0.5 MG tablet TAKE 1 TABLET BY MOUTH TWICE A DAY AS NEEDED  . atorvastatin (LIPITOR) 40 MG tablet TAKE 1 TABLET BY MOUTH EVERY DAY  . carbamide peroxide (DEBROX) 6.5 % OTIC solution Place 5 drops into the right ear 2 (two) times daily.  . cholecalciferol (VITAMIN D) 1000 units tablet Take 2,000 Units by mouth daily.  Marland Kitchen losartan-hydrochlorothiazide (HYZAAR) 100-12.5 MG tablet TAKE 1 TABLET BY MOUTH EVERY DAY  . Turmeric 500 MG CAPS Take 2 capsules by mouth daily.  . [DISCONTINUED] sulfamethoxazole-trimethoprim (BACTRIM) 400-80 MG tablet Take 1 tablet by mouth 2 (two) times daily.    No facility-administered encounter medications on file as of 12/27/2019.    Allergies (verified) Patient has no known allergies.   History: Past Medical History:  Diagnosis Date  . BENIGN PROSTATIC HYPERTROPHY 07/28/2006  . GERD 07/28/2006  . HYPERLIPIDEMIA 07/28/2006  . NEOPLASM, MALIGNANT, PROSTATE 08/01/2007  . NISSEN FUNDOPLICATION, HX OF 09/24/9468  . Unspecified essential hypertension 03/27/2007   Past Surgical History:  Procedure Laterality Date  . NISSEN FUNDOPLICATION  9628  . PROSTATECTOMY  2009  . TONSILLECTOMY     Family History  Problem Relation Age of Onset  . Heart disease Mother   . Prostate cancer Father        prostate  . Colon cancer Neg Hx    Social History   Socioeconomic History  . Marital status: Married    Spouse name: Not on file  . Number of children: Not on file  . Years of education: Not on file  . Highest education level: Not on file  Occupational History  .  Not on file  Tobacco Use  . Smoking status: Former Research scientist (life sciences)  . Smokeless tobacco: Never Used  . Tobacco comment: smoked in college  Substance and Sexual Activity  . Alcohol use: Yes    Alcohol/week: 0.0 standard drinks    Comment: rarely; once or twice yearly  . Drug use: No  . Sexual activity: Not on file  Other Topics Concern  . Not on file  Social  History Narrative  . Not on file   Social Determinants of Health   Financial Resource Strain: Low Risk   . Difficulty of Paying Living Expenses: Not hard at all  Food Insecurity: No Food Insecurity  . Worried About Charity fundraiser in the Last Year: Never true  . Ran Out of Food in the Last Year: Never true  Transportation Needs: No Transportation Needs  . Lack of Transportation (Medical): No  . Lack of Transportation (Non-Medical): No  Physical Activity: Sufficiently Active  . Days of Exercise per Week: 5 days  . Minutes of Exercise per Session: 30 min  Stress: No Stress Concern Present  . Feeling of Stress : Not at all  Social Connections: Moderately Isolated  . Frequency of Communication with Friends and Family: More than three times a week  . Frequency of Social Gatherings with Friends and Family: Once a week  . Attends Religious Services: Never  . Active Member of Clubs or Organizations: No  . Attends Archivist Meetings: Never  . Marital Status: Married    Tobacco Counseling Counseling given: Not Answered Comment: smoked in college   Clinical Intake:  Pre-visit preparation completed: Yes  Pain : No/denies pain     Nutritional Status: BMI > 30  Obese Nutritional Risks: None Diabetes: No  How often do you need to have someone help you when you read instructions, pamphlets, or other written materials from your doctor or pharmacy?: 1 - Never What is the last grade level you completed in school?: College  Diabetic?No  Interpreter Needed?: No  Information entered by :: Caroleen Hamman LPN   Activities of Daily Living In your present state of health, do you have any difficulty performing the following activities: 12/27/2019  Hearing? N  Vision? N  Difficulty concentrating or making decisions? N  Walking or climbing stairs? N  Dressing or bathing? N  Doing errands, shopping? N  Preparing Food and eating ? N  Using the Toilet? N  In the past  six months, have you accidently leaked urine? N  Do you have problems with loss of bowel control? N  Managing your Medications? N  Managing your Finances? N  Housekeeping or managing your Housekeeping? N  Some recent data might be hidden    Patient Care Team: Libby Maw, MD as PCP - General (Family Medicine)  Indicate any recent Medical Services you may have received from other than [facility] providers in the past year (date may be approximate).     Assessment:   This is a routine wellness examination for Jahdiel.  Hearing/Vision screen  Hearing Screening   125Hz  250Hz  500Hz  1000Hz  2000Hz  3000Hz  4000Hz  6000Hz  8000Hz   Right ear:           Left ear:           Comments: No hearing loss  Vision Screening Comments: Reading glasses Last eye exam-04/2019- Opthalmology  Dietary issues and exercise activities discussed: Current Exercise Habits: Home exercise routine, Type of exercise: strength training/weights, Time (Minutes): 25, Frequency (Times/Week): 5, Weekly  Exercise (Minutes/Week): 125, Intensity: Mild, Exercise limited by: orthopedic condition(s)  Goals    . Patient Stated     Eat healthier and work on getting shoulder feeling better      Depression Screen PHQ 2/9 Scores 12/27/2019 12/17/2019 11/14/2017 10/14/2017 01/01/2015 02/25/2014  PHQ - 2 Score 0 0 0 0 0 0  PHQ- 9 Score - - - 2 - -    Fall Risk Fall Risk  12/27/2019 12/17/2019 11/14/2017 02/25/2014  Falls in the past year? 0 0 No No  Number falls in past yr: 0 - - -  Injury with Fall? 0 - - -  Follow up Falls prevention discussed - - -    Any stairs in or around the home? No  Home free of loose throw rugs in walkways, pet beds, electrical cords, etc? Yes  Adequate lighting in your home to reduce risk of falls? Yes   ASSISTIVE DEVICES UTILIZED TO PREVENT FALLS:  Life alert? No  Use of a cane, walker or w/c? No  Grab bars in the bathroom? Yes  Shower chair or bench in shower? No  Elevated toilet  seat or a handicapped toilet? No   TIMED UP AND GO:  Was the test performed? No .phone visit   Cognitive Function:No cognitive impairment noted     6CIT Screen 12/27/2019  What Year? 0 points  What month? 0 points  What time? 0 points  Count back from 20 0 points  Months in reverse 0 points  Repeat phrase 0 points  Total Score 0    Immunizations Immunization History  Administered Date(s) Administered  . Fluad Quad(high Dose 65+) 12/01/2018, 12/17/2019  . Influenza, High Dose Seasonal PF 12/15/2017  . Influenza,inj,Quad PF,6+ Mos 02/08/2013  . Moderna SARS-COVID-2 Vaccination 04/02/2019, 04/30/2019  . Pneumococcal Conjugate-13 12/23/2015  . Tdap 02/09/2011, 08/30/2018  . Zoster 02/07/2012    TDAP status: Up to date   Flu Vaccine status: Up to date   Pneumococcal vaccine status: Pneumovax 23 due-Discuss with PCP at next office visit.  Covid-19 vaccine status: Completed vaccines  Qualifies for Shingles Vaccine? Yes   Zostavax completed Yes   Shingrix Completed?: No.    Education has been provided regarding the importance of this vaccine. Patient has been advised to call insurance company to determine out of pocket expense if they have not yet received this vaccine. Advised may also receive vaccine at local pharmacy or Health Dept. Verbalized acceptance and understanding.  Screening Tests Health Maintenance  Topic Date Due  . Hepatitis C Screening  Never done  . PNA vac Low Risk Adult (2 of 2 - PPSV23) 12/22/2016  . COLONOSCOPY  08/17/2025  . TETANUS/TDAP  08/29/2028  . INFLUENZA VACCINE  Completed  . COVID-19 Vaccine  Completed    Health Maintenance  Health Maintenance Due  Topic Date Due  . Hepatitis C Screening  Never done  . PNA vac Low Risk Adult (2 of 2 - PPSV23) 12/22/2016    Colorectal cancer screening: Completed 08/18/2015. Repeat every 10 years  Lung Cancer Screening: (Low Dose CT Chest recommended if Age 64-80 years, 30 pack-year currently smoking  OR have quit w/in 15years.) does not qualify.     Additional Screening:  Hepatitis C Screening: does qualify; Discuss with PCP  Vision Screening: Recommended annual ophthalmology exams for early detection of glaucoma and other disorders of the eye. Is the patient up to date with their annual eye exam?  Yes  Who is the provider or what is the  name of the office in which the patient attends annual eye exams? Dr. Satira Sark   Dental Screening: Recommended annual dental exams for proper oral hygiene  Community Resource Referral / Chronic Care Management: CRR required this visit?  No   CCM required this visit?  No      Plan:     I have personally reviewed and noted the following in the patient's chart:   . Medical and social history . Use of alcohol, tobacco or illicit drugs  . Current medications and supplements . Functional ability and status . Nutritional status . Physical activity . Advanced directives . List of other physicians . Hospitalizations, surgeries, and ER visits in previous 12 months . Vitals . Screenings to include cognitive, depression, and falls . Referrals and appointments  In addition, I have reviewed and discussed with patient certain preventive protocols, quality metrics, and best practice recommendations. A written personalized care plan for preventive services as well as general preventive health recommendations were provided to patient.   Due to this being a telephonic visit, the after visit summary with patients personalized plan was offered to patient via mail or my-chart. Per request, patient was mailed a copy of [place], LPN   15/94/7076  Nurse Health Advisor  Nurse Notes: None

## 2019-12-27 NOTE — Patient Instructions (Signed)
Christopher [name] , Thank you for taking time to complete your Medicare Wellness Visit. I appreciate your ongoing commitment to your health goals. Please review the following plan we discussed and let me know if I can assist you in the future.   Screening recommendations/referrals: Colonoscopy: Completed 08/18/2015-Due-08/17/2025 Recommended yearly ophthalmology/optometry visit for glaucoma screening and checkup Recommended yearly dental visit for hygiene and checkup  Vaccinations: Influenza vaccine: Up to Date Pneumococcal vaccine: Pneumovax-23 due-May obtain vaccine at our office or your local pharmacy. Tdap vaccine: Up to Date- Due-08/29/2028 Shingles vaccine: Discuss with pharmacy  Covid-19: Completed vaccines  Advanced directives: Please bring a copy for your chart  Conditions/risks identified: See problem list  Next appointment: Follow up in one year for your annual wellness visit.   Preventive Care 69 Years and Older, Male Preventive care refers to lifestyle choices and visits with your health care provider that can promote health and wellness. What does preventive care include?  A yearly physical exam. This is also called an annual well check.  Dental exams once or twice a year.  Routine eye exams. Ask your health care provider how often you should have your eyes checked.  Personal lifestyle choices, including:  Daily care of your teeth and gums.  Regular physical activity.  Eating a healthy diet.  Avoiding tobacco and drug use.  Limiting alcohol use.  Practicing safe sex.  Taking low doses of aspirin every day.  Taking vitamin and mineral supplements as recommended by your health care provider. What happens during an annual well check? The services and screenings done by your health care provider during your annual well check will depend on your age, overall health, lifestyle risk factors, and family history of disease. Counseling  Your health care provider may ask you  questions about your:  Alcohol use.  Tobacco use.  Drug use.  Emotional well-being.  Home and relationship well-being.  Sexual activity.  Eating habits.  History of falls.  Memory and ability to understand (cognition).  Work and work Statistician. Screening  You may have the following tests or measurements:  Height, weight, and BMI.  Blood pressure.  Lipid and cholesterol levels. These may be checked every 5 years, or more frequently if you are over 39 years old.  Skin check.  Lung cancer screening. You may have this screening every year starting at age 29 if you have a 30-pack-year history of smoking and currently smoke or have quit within the past 15 years.  Fecal occult blood test (FOBT) of the stool. You may have this test every year starting at age 53.  Flexible sigmoidoscopy or colonoscopy. You may have a sigmoidoscopy every 5 years or a colonoscopy every 10 years starting at age 35.  Prostate cancer screening. Recommendations will vary depending on your family history and other risks.  Hepatitis C blood test.  Hepatitis B blood test.  Sexually transmitted disease (STD) testing.  Diabetes screening. This is done by checking your blood sugar (glucose) after you have not eaten for a while (fasting). You may have this done every 1-3 years.  Abdominal aortic aneurysm (AAA) screening. You may need this if you are a current or former smoker.  Osteoporosis. You may be screened starting at age 19 if you are at high risk. Talk with your health care provider about your test results, treatment options, and if necessary, the need for more tests. Vaccines  Your health care provider may recommend certain vaccines, such as:  Influenza vaccine. This is recommended every  year.  Tetanus, diphtheria, and acellular pertussis (Tdap, Td) vaccine. You may need a Td booster every 10 years.  Zoster vaccine. You may need this after age 71.  Pneumococcal 13-valent conjugate  (PCV13) vaccine. One dose is recommended after age 86.  Pneumococcal polysaccharide (PPSV23) vaccine. One dose is recommended after age 40. Talk to your health care provider about which screenings and vaccines you need and how often you need them. This information is not intended to replace advice given to you by your health care provider. Make sure you discuss any questions you have with your health care provider. Document Released: 03/21/2015 Document Revised: 11/12/2015 Document Reviewed: 12/24/2014 Elsevier Interactive Patient Education  2017 [place] Prevention in the Home Falls can cause injuries. They can happen to people of all ages. There are many things you can do to make your home safe and to help prevent falls. What can I do on the outside of my home?  Regularly fix the edges of walkways and driveways and fix any cracks.  Remove anything that might make you trip as you walk through a door, such as a raised step or threshold.  Trim any bushes or trees on the path to your home.  Use bright outdoor lighting.  Clear any walking paths of anything that might make someone trip, such as rocks or tools.  Regularly check to see if handrails are loose or broken. Make sure that both sides of any steps have handrails.  Any raised decks and porches should have guardrails on the edges.  Have any leaves, snow, or ice cleared regularly.  Use sand or salt on walking paths during winter.  Clean up any spills in your garage right away. This includes oil or grease spills. What can I do in the bathroom?  Use night lights.  Install grab bars by the toilet and in the tub and shower. Do not use towel bars as grab bars.  Use non-skid mats or decals in the tub or shower.  If you need to sit down in the shower, use a plastic, non-slip stool.  Keep the floor dry. Clean up any water that spills on the floor as soon as it happens.  Remove soap buildup in the tub or shower  regularly.  Attach bath mats securely with double-sided non-slip rug tape.  Do not have throw rugs and other things on the floor that can make you trip. What can I do in the bedroom?  Use night lights.  Make sure that you have a light by your bed that is easy to reach.  Do not use any sheets or blankets that are too big for your bed. They should not hang down onto the floor.  Have a firm chair that has side arms. You can use this for support while you get dressed.  Do not have throw rugs and other things on the floor that can make you trip. What can I do in the kitchen?  Clean up any spills right away.  Avoid walking on wet floors.  Keep items that you use a lot in easy-to-reach places.  If you need to reach something above you, use a strong step stool that has a grab bar.  Keep electrical cords out of the way.  Do not use floor polish or wax that makes floors slippery. If you must use wax, use non-skid floor wax.  Do not have throw rugs and other things on the floor that can make you trip. What can  I do with my stairs?  Do not leave any items on the stairs.  Make sure that there are handrails on both sides of the stairs and use them. Fix handrails that are broken or loose. Make sure that handrails are as long as the stairways.  Check any carpeting to make sure that it is firmly attached to the stairs. Fix any carpet that is loose or worn.  Avoid having throw rugs at the top or bottom of the stairs. If you do have throw rugs, attach them to the floor with carpet tape.  Make sure that you have a light switch at the top of the stairs and the bottom of the stairs. If you do not have them, ask someone to add them for you. What else can I do to help prevent falls?  Wear shoes that:  Do not have high heels.  Have rubber bottoms.  Are comfortable and fit you well.  Are closed at the toe. Do not wear sandals.  If you use a stepladder:  Make sure that it is fully  opened. Do not climb a closed stepladder.  Make sure that both sides of the stepladder are locked into place.  Ask someone to hold it for you, if possible.  Clearly mark and make sure that you can see:  Any grab bars or handrails.  First and last steps.  Where the edge of each step is.  Use tools that help you move around (mobility aids) if they are needed. These include:  Canes.  Walkers.  Scooters.  Crutches.  Turn on the lights when you go into a dark area. Replace any light bulbs as soon as they burn out.  Set up your furniture so you have a clear path. Avoid moving your furniture around.  If any of your floors are uneven, fix them.  If there are any pets around you, be aware of where they are.  Review your medicines with your doctor. Some medicines can make you feel dizzy. This can increase your chance of falling. Ask your doctor what other things that you can do to help prevent falls. This information is not intended to replace advice given to you by your health care provider. Make sure you discuss any questions you have with your health care provider. Document Released: 12/19/2008 Document Revised: 07/31/2015 Document Reviewed: 03/29/2014 Elsevier Interactive Patient Education  2017 [employer].

## 2019-12-29 DIAGNOSIS — G8929 Other chronic pain: Secondary | ICD-10-CM | POA: Diagnosis not present

## 2019-12-29 DIAGNOSIS — M25511 Pain in right shoulder: Secondary | ICD-10-CM | POA: Diagnosis not present

## 2019-12-29 DIAGNOSIS — M25611 Stiffness of right shoulder, not elsewhere classified: Secondary | ICD-10-CM | POA: Diagnosis not present

## 2019-12-31 DIAGNOSIS — M25611 Stiffness of right shoulder, not elsewhere classified: Secondary | ICD-10-CM | POA: Diagnosis not present

## 2019-12-31 DIAGNOSIS — G8929 Other chronic pain: Secondary | ICD-10-CM | POA: Diagnosis not present

## 2019-12-31 DIAGNOSIS — M25511 Pain in right shoulder: Secondary | ICD-10-CM | POA: Diagnosis not present

## 2020-01-02 DIAGNOSIS — M25511 Pain in right shoulder: Secondary | ICD-10-CM | POA: Diagnosis not present

## 2020-01-02 DIAGNOSIS — G8929 Other chronic pain: Secondary | ICD-10-CM | POA: Diagnosis not present

## 2020-01-02 DIAGNOSIS — M25611 Stiffness of right shoulder, not elsewhere classified: Secondary | ICD-10-CM | POA: Diagnosis not present

## 2020-01-05 DIAGNOSIS — M25511 Pain in right shoulder: Secondary | ICD-10-CM | POA: Diagnosis not present

## 2020-01-05 DIAGNOSIS — M25611 Stiffness of right shoulder, not elsewhere classified: Secondary | ICD-10-CM | POA: Diagnosis not present

## 2020-01-05 DIAGNOSIS — G8929 Other chronic pain: Secondary | ICD-10-CM | POA: Diagnosis not present

## 2020-01-07 DIAGNOSIS — M25611 Stiffness of right shoulder, not elsewhere classified: Secondary | ICD-10-CM | POA: Diagnosis not present

## 2020-01-07 DIAGNOSIS — M25511 Pain in right shoulder: Secondary | ICD-10-CM | POA: Diagnosis not present

## 2020-01-07 DIAGNOSIS — G8929 Other chronic pain: Secondary | ICD-10-CM | POA: Diagnosis not present

## 2020-01-09 DIAGNOSIS — M25511 Pain in right shoulder: Secondary | ICD-10-CM | POA: Diagnosis not present

## 2020-01-09 DIAGNOSIS — M25611 Stiffness of right shoulder, not elsewhere classified: Secondary | ICD-10-CM | POA: Diagnosis not present

## 2020-01-09 DIAGNOSIS — G8929 Other chronic pain: Secondary | ICD-10-CM | POA: Diagnosis not present

## 2020-01-10 ENCOUNTER — Other Ambulatory Visit: Payer: Self-pay | Admitting: Family Medicine

## 2020-01-10 NOTE — Telephone Encounter (Signed)
Refill request on pending medication, last OV 12/17/19 last refill 12/04/19 please advise.

## 2020-01-12 DIAGNOSIS — G8929 Other chronic pain: Secondary | ICD-10-CM | POA: Diagnosis not present

## 2020-01-12 DIAGNOSIS — M25511 Pain in right shoulder: Secondary | ICD-10-CM | POA: Diagnosis not present

## 2020-01-12 DIAGNOSIS — M25611 Stiffness of right shoulder, not elsewhere classified: Secondary | ICD-10-CM | POA: Diagnosis not present

## 2020-01-14 ENCOUNTER — Telehealth: Payer: Self-pay | Admitting: Family Medicine

## 2020-01-14 NOTE — Telephone Encounter (Signed)
Spoke with patient about keeping appointment on the 23rd. Feels like he is moving in right direction as his pain has improved.

## 2020-01-14 NOTE — Telephone Encounter (Signed)
Patient called stating that he was released from PT on Thursday and was given resistance exercises to do from home.  He is scheduled to see Dr Tamala Julian on 01/29/2020 to follow up. He asked if he should be seen sooner to document his current progress or if he is okay to wait until his scheduled appointment.  Please advise.

## 2020-01-28 NOTE — Progress Notes (Signed)
[place] Phone: [phone] Subjective:   Christopher [name], am serving as a scribe for Dr. Hulan Saas.This visit occurred during the SARS-CoV-2 public health emergency.  Safety protocols were in place, including screening questions prior to the visit, additional usage of staff PPE, and extensive cleaning of exam room while observing appropriate contact time as indicated for disinfecting solutions.  I'm seeing this patient by the request  of:  Libby Maw, MD  CC: Shoulder knee pain follow-up  OVF:[id number]   12/17/2019 Patient does have signs and symptoms that is consistent still with bursitis and also potential labral pathology.  Has made improvement patient is feeling like the physical therapy is helpful.  We discussed the potential for injection the patient would like to hold at this time but will consider it again at follow-up in 4 to 6 weeks.  Patient will watch for any other symptoms that could be contributing or any worsening of the weakness.   Update 01/29/2020 Christopher [name]. is a 69 y.o. male coming in with complaint of right shoulder pain. Patient states that he has had improvement with range of motion. Does have pain with lying on right side. Little pain throughout the day. When he has pain the posterior deltoid and tricep become sore.  Patient feels like he just fatigues over the course of the activity.  Patient does feel about 75% better than last exam none.  Denies of weakness just once again the fatigue and the mild pain at night.  Patient is the primary caregiver for his wife and has to help her with transferring since she had a shoulder dislocation.  Bilateral knee pain continues. Injected with gel on 10/17/2019. Pain improved for one month.  Patient states now approximately 4 months out from those injections he would state that he is not any significantly better.  Does not feel he needs  steroid injections but does not know what else to do at this time     Past Medical History:  Diagnosis Date  . BENIGN PROSTATIC HYPERTROPHY 07/28/2006  . GERD 07/28/2006  . HYPERLIPIDEMIA 07/28/2006  . NEOPLASM, MALIGNANT, PROSTATE 08/01/2007  . NISSEN FUNDOPLICATION, HX OF 06/21/6061  . Unspecified essential hypertension 03/27/2007   Past Surgical History:  Procedure Laterality Date  . NISSEN FUNDOPLICATION  0160  . PROSTATECTOMY  2009  . TONSILLECTOMY     Social History   Socioeconomic History  . Marital status: Married    Spouse name: Not on file  . Number of children: Not on file  . Years of education: Not on file  . Highest education level: Not on file  Occupational History  . Not on file  Tobacco Use  . Smoking status: Former Research scientist (life sciences)  . Smokeless tobacco: Never Used  . Tobacco comment: smoked in college  Substance and Sexual Activity  . Alcohol use: Yes    Alcohol/week: 0.0 standard drinks    Comment: rarely; once or twice yearly  . Drug use: [name]  . Sexual activity: Not on file  Other Topics Concern  . Not on file  Social History Narrative  . Not on file   Social Determinants of Health   Financial Resource Strain: Low Risk   . Difficulty of Paying Living Expenses: Not hard at all  Food Insecurity: [name] Food Insecurity  . Worried About Charity fundraiser in the Last Year: Never true  . Ran Out of Food in the Last  Year: Never true  Transportation Needs: [name] Transportation Needs  . Lack of Transportation (Medical): [name]  . Lack of Transportation (Non-Medical): [name]  Physical Activity: Sufficiently Active  . Days of Exercise per Week: 5 days  . Minutes of Exercise per Session: 30 min  Stress: [name] Stress Concern Present  . Feeling of Stress : Not at all  Social Connections: Moderately Isolated  . Frequency of Communication with Friends and Family: More than three times a week  . Frequency of Social Gatherings with Friends and Family: Once a week  . Attends Religious  Services: Never  . Active Member of Clubs or Organizations: [name]  . Attends Archivist Meetings: Never  . Marital Status: Married   [name] Known Allergies Family History  Problem Relation Age of Onset  . Heart disease Mother   . Prostate cancer Father        prostate  . Colon cancer Neg Hx      Current Outpatient Medications (Cardiovascular):  .  atorvastatin (LIPITOR) 40 MG tablet, TAKE 1 TABLET BY MOUTH EVERY DAY .  losartan-hydrochlorothiazide (HYZAAR) 100-12.5 MG tablet, TAKE 1 TABLET BY MOUTH EVERY DAY     Current Outpatient Medications (Other):  Marland Kitchen  ALPRAZolam (XANAX) 0.5 MG tablet, TAKE 1 TABLET BY MOUTH TWICE A DAY AS NEEDED .  carbamide peroxide (DEBROX) 6.5 % OTIC solution, Place 5 drops into the right ear 2 (two) times daily. .  cholecalciferol (VITAMIN D) 1000 units tablet, Take 2,000 Units by mouth daily. .  Turmeric 500 MG CAPS, Take 2 capsules by mouth daily.   Reviewed prior external information including notes and imaging from  primary care provider As well as notes that were available from care everywhere and other healthcare systems.  Past medical history, social, surgical and family history all reviewed in electronic medical record.  [name] pertanent information unless stated regarding to the chief complaint.   Review of Systems:  [name] headache, visual changes, nausea, vomiting, diarrhea, constipation, dizziness, abdominal pain, skin rash, fevers, chills, night sweats, weight loss, swollen lymph nodes, body aches, joint swelling, chest pain, shortness of breath, mood changes. POSITIVE muscle aches  Objective  Blood pressure 110/82, pulse 98, height 6\' 1"  (1.854 m), weight 239 lb (108.4 kg), SpO2 99 %.   General: [name] apparent distress alert and oriented x3 mood and affect normal, dressed appropriately.  HEENT: Pupils equal, extraocular movements intact  Respiratory: Patient's speak in full sentences and does not appear short of breath  Cardiovascular: [name]  lower extremity edema, non tender, [name] erythema  Neuro: Cranial nerves II through XII are intact, neurovascularly intact in all extremities with 2+  Right shoulder exam shows the patient still has positive impingement with Hawkins and Neer's.  +5 strength of the rotator cuff noted.  Positive O'Brien's noted.  Neurovascularly intact distally.  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  Mild instability with valgus and varus force.  Crepitus noted right greater than left with a trace effusion on the right knee.  Limited musculoskeletal ultrasound was performed and interpreted by Lyndal Pulley  Limited ultrasound of patient's right shoulder shows the patient's bicep tendon which had hypoechoic changes previously does seem to have improvement.  Does have a mild reactive bursitis in the subacromial area noted.  Rotator cuff difficult to assess completely but does appear to be intact.  Mild arthritic changes of the glenohumeral and acromioclavicular joint noted.   Impression and Recommendations:     The above  documentation has been reviewed and is accurate and complete Lyndal Pulley, DO

## 2020-01-29 ENCOUNTER — Ambulatory Visit: Payer: Self-pay

## 2020-01-29 ENCOUNTER — Encounter: Payer: Self-pay | Admitting: Family Medicine

## 2020-01-29 ENCOUNTER — Ambulatory Visit: Payer: Medicare HMO | Admitting: Family Medicine

## 2020-01-29 ENCOUNTER — Other Ambulatory Visit: Payer: Self-pay

## 2020-01-29 VITALS — BP 110/82 | HR 98 | Ht 73.0 in | Wt 239.0 lb

## 2020-01-29 DIAGNOSIS — G8929 Other chronic pain: Secondary | ICD-10-CM | POA: Diagnosis not present

## 2020-01-29 DIAGNOSIS — M25511 Pain in right shoulder: Secondary | ICD-10-CM | POA: Diagnosis not present

## 2020-01-29 DIAGNOSIS — M170 Bilateral primary osteoarthritis of knee: Secondary | ICD-10-CM | POA: Diagnosis not present

## 2020-01-29 DIAGNOSIS — M7551 Bursitis of right shoulder: Secondary | ICD-10-CM | POA: Diagnosis not present

## 2020-01-29 NOTE — Assessment & Plan Note (Signed)
Patient's been doing relatively well after the physical therapy at this time.  We discussed with patient about icing regimen, we discussed the potential for injections which patient declined.  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  Patient would not want any surgical intervention at this time and would not change management.  Patient will follow up again in 7 to 8 weeks

## 2020-01-29 NOTE — Patient Instructions (Signed)
IF shoulder gets worse lets get MRI Understand responsibilities you have at home Keep doing exercises 2-3 x a week Pennsaid for knees See me in 7-8 weeks

## 2020-01-29 NOTE — Assessment & Plan Note (Signed)
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  Has been doing relatively well and does feel that the viscosupplementation helped initially but no longer feels improvement.  We discussed potential injections and patient declined.  Patient would like to follow-up again in 2 months to further evaluate.  At that time we will consider repeat x-rays with them being nearly 69 years old at that time

## 2020-02-08 ENCOUNTER — Other Ambulatory Visit: Payer: Self-pay | Admitting: Family Medicine

## 2020-02-08 NOTE — Telephone Encounter (Signed)
Last OV 12/17/19 Last fill 11/13/19  #90/0

## 2020-02-15 ENCOUNTER — Other Ambulatory Visit: Payer: Self-pay | Admitting: Family Medicine

## 2020-02-15 NOTE — Telephone Encounter (Signed)
Please see message and advise.  Thank you. Last OV 12/17/19 Last fill 01/10/20  #60/0

## 2020-03-18 ENCOUNTER — Encounter: Payer: Self-pay | Admitting: Family Medicine

## 2020-03-18 ENCOUNTER — Ambulatory Visit: Payer: Medicare HMO | Admitting: Family Medicine

## 2020-03-18 ENCOUNTER — Other Ambulatory Visit: Payer: Self-pay

## 2020-03-18 DIAGNOSIS — M170 Bilateral primary osteoarthritis of knee: Secondary | ICD-10-CM | POA: Diagnosis not present

## 2020-03-18 DIAGNOSIS — M7551 Bursitis of right shoulder: Secondary | ICD-10-CM | POA: Diagnosis not present

## 2020-03-18 NOTE — Assessment & Plan Note (Signed)
Chronic problem with exacerbation.  Has responded well to the injections previously.  Does do well for about 3 months and then will get viscosupplementation approval.  We discussed icing regimen and home exercises, icing regimen encouraged still to monitor weight.  Follow-up again in 3 months

## 2020-03-18 NOTE — Assessment & Plan Note (Signed)
Patient has made improvement but still having some discomfort.  Did not want to do another steroid injection today but will consider in 3 weeks.  At that time ultrasound and decide on the glenohumeral and potentially acromioclavicular injections.  If that does not help then we will need to consider the possibility of an MRI to further evaluate for possible labral pathology that could also be contributing.

## 2020-03-18 NOTE — Patient Instructions (Signed)
Good to see you Keep doing shoulder exercises 2-3 times a week If worse MRI or injections  See me again in 4-6 weeks we can do gel injections

## 2020-03-18 NOTE — Progress Notes (Signed)
[facility] [address] [place] Phone: [phone] Subjective:   I Kandace Blitz am serving as a Education administrator for Dr. Hulan Saas.  This visit occurred during the SARS-CoV-2 public health emergency.  Safety protocols were in place, including screening questions prior to the visit, additional usage of staff PPE, and extensive cleaning of exam room while observing appropriate contact time as indicated for disinfecting solutions.   I'm seeing this patient by the request  of:  Libby Maw, MD  CC: bilateral knee pain and shoulder pain   [id number]   01/29/2020 Patient's been doing relatively well after the physical therapy at this time.  We discussed with patient about icing regimen, we discussed the potential for injections which patient declined.  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  Patient would not want any surgical intervention at this time and would not change management.  Patient will follow up again in 7 to 8 weeks  Patient does have bilateral arthritic changes of the knees.  Has been doing relatively well and does feel that the viscosupplementation helped initially but no longer feels improvement.  We discussed potential injections and patient declined.  Patient would like to follow-up again in 2 months to further evaluate.  At that time we will consider repeat x-rays with them being nearly 70 years old at that time  Update 03/18/2020 [name] is a 70 y.o. male coming in with complaint of right shoulder and bilateral knee pain. Patient states the knees are painful today. States he is 30 days past his usual. Sitting, standing and stairs are painful. Shoulder has no pain. Still having some issues on the lateral shoulder with extension and abduction.  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  Has responded  fairly well to injections over the course of time.  Patient states starting to have increasing discomfort again affecting daily activities.  Patient though is able to do most daily activities just has more discomfort with him now.  Right shoulder pain was found to have more of a bursitis.  Did do well with physical therapy.  Has noticed minimal pain at this time and can sleep on the side.  Does have tightness still noted of the shoulder     Past Medical History:  Diagnosis Date  . BENIGN PROSTATIC HYPERTROPHY 07/28/2006  . GERD 07/28/2006  . HYPERLIPIDEMIA 07/28/2006  . NEOPLASM, MALIGNANT, PROSTATE 08/01/2007  . NISSEN FUNDOPLICATION, HX OF [id number]  . Unspecified essential hypertension 03/27/2007   Past Surgical History:  Procedure Laterality Date  . NISSEN FUNDOPLICATION  [id number]  . PROSTATECTOMY  2009  . TONSILLECTOMY     Social History   Socioeconomic History  . Marital status: Married    Spouse name: Not on file  . Number of children: Not on file  . Years of education: Not on file  . Highest education level: Not on file  Occupational History  . Not on file  Tobacco Use  . Smoking status: Former Research scientist (life sciences)  . Smokeless tobacco: Never Used  . Tobacco comment: smoked in college  Substance and Sexual Activity  . Alcohol use: Yes    Alcohol/week: 0.0 standard drinks    Comment: rarely; once or twice yearly  . Drug use: No  . Sexual activity: Not on file  Other Topics Concern  . Not on file  Social History Narrative  .  Not on file   Social Determinants of Health   Financial Resource Strain: Low Risk   . Difficulty of Paying Living Expenses: Not hard at all  Food Insecurity: No Food Insecurity  . Worried About Charity fundraiser in the Last Year: Never true  . Ran Out of Food in the Last Year: Never true  Transportation Needs: No Transportation Needs  . Lack of Transportation (Medical): No  . Lack of Transportation (Non-Medical): No  Physical Activity: Sufficiently Active   . Days of Exercise per Week: 5 days  . Minutes of Exercise per Session: 30 min  Stress: No Stress Concern Present  . Feeling of Stress : Not at all  Social Connections: Moderately Isolated  . Frequency of Communication with Friends and Family: More than three times a week  . Frequency of Social Gatherings with Friends and Family: Once a week  . Attends Religious Services: Never  . Active Member of Clubs or Organizations: No  . Attends Archivist Meetings: Never  . Marital Status: Married   No Known Allergies Family History  Problem Relation Age of Onset  . Heart disease Mother   . Prostate cancer Father        prostate  . Colon cancer Neg Hx      Current Outpatient Medications (Cardiovascular):  .  atorvastatin (LIPITOR) 40 MG tablet, TAKE 1 TABLET BY MOUTH EVERY DAY .  losartan-hydrochlorothiazide (HYZAAR) 100-12.5 MG tablet, TAKE 1 TABLET BY MOUTH EVERY DAY     Current Outpatient Medications (Other):  Marland Kitchen  ALPRAZolam (XANAX) 0.5 MG tablet, TAKE 1 TABLET BY MOUTH TWICE A DAY AS NEEDED .  carbamide peroxide (DEBROX) 6.5 % OTIC solution, Place 5 drops into the right ear 2 (two) times daily. .  cholecalciferol (VITAMIN D) 1000 units tablet, Take 2,000 Units by mouth daily. .  Turmeric 500 MG CAPS, Take 2 capsules by mouth daily.   Reviewed prior external information including notes and imaging from  primary care provider As well as notes that were available from care everywhere and other healthcare systems.  Past medical history, social, surgical and family history all reviewed in electronic medical record.  No pertanent information unless stated regarding to the chief complaint.   Review of Systems:  No headache, visual changes, nausea, vomiting, diarrhea, constipation, dizziness, abdominal pain, skin rash, fevers, chills, night sweats, weight loss, swollen lymph nodes, body aches, joint swelling, chest pain, shortness of breath, mood changes. POSITIVE muscle  aches  Objective  Blood pressure 130/90, pulse 65, height 6\' 1"  (1.854 m), weight 242 lb (109.8 kg), SpO2 100 %.   General: No apparent distress alert and oriented x3 mood and affect normal, dressed appropriately.  HEENT: Pupils equal, extraocular movements intact  Respiratory: Patient's speak in full sentences and does not appear short of breath  Cardiovascular: No lower extremity edema, non tender, no erythema  Mild antalgic gait. Knee exam bilaterally shows the patient does have some abnormal thigh to calf ratio.  Very mild instability with valgus and varus force bilaterally.  Lacks last 5 degrees of flexion bilaterally.  Tender to palpation mostly of the medial joint line and mild over the patellofemoral joint  Right shoulder exam shows the patient does some positive impingement noted.  Patient does have tightness noted with apprehension test.  Mildly positive O'Brien's.  After informed written and verbal consent, patient was seated on exam table. Right knee was prepped with alcohol swab and utilizing anterolateral approach, patient's right knee space was  injected with 4:1  marcaine 0.5%: Kenalog 40mg /dL. Patient tolerated the procedure well without immediate complications.  After informed written and verbal consent, patient was seated on exam table. Left knee was prepped with alcohol swab and utilizing anterolateral approach, patient's left knee space was injected with 4:1  marcaine 0.5%: Kenalog 40mg /dL. Patient tolerated the procedure well without immediate complications.    Impression and Recommendations:     The above documentation has been reviewed and is accurate and complete Lyndal Pulley, DO

## 2020-03-21 ENCOUNTER — Other Ambulatory Visit: Payer: Self-pay | Admitting: Family

## 2020-04-08 ENCOUNTER — Other Ambulatory Visit: Payer: Self-pay

## 2020-04-08 ENCOUNTER — Ambulatory Visit: Payer: Medicare HMO | Admitting: Family Medicine

## 2020-04-08 ENCOUNTER — Ambulatory Visit: Payer: Self-pay

## 2020-04-08 ENCOUNTER — Encounter: Payer: Self-pay | Admitting: Family Medicine

## 2020-04-08 VITALS — BP 110/82 | HR 64 | Ht 73.0 in | Wt 238.0 lb

## 2020-04-08 DIAGNOSIS — G8929 Other chronic pain: Secondary | ICD-10-CM

## 2020-04-08 DIAGNOSIS — M19011 Primary osteoarthritis, right shoulder: Secondary | ICD-10-CM

## 2020-04-08 DIAGNOSIS — M7551 Bursitis of right shoulder: Secondary | ICD-10-CM

## 2020-04-08 DIAGNOSIS — M25511 Pain in right shoulder: Secondary | ICD-10-CM | POA: Diagnosis not present

## 2020-04-08 DIAGNOSIS — M19019 Primary osteoarthritis, unspecified shoulder: Secondary | ICD-10-CM | POA: Insufficient documentation

## 2020-04-08 NOTE — Assessment & Plan Note (Signed)
Patient given injection today.  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  Patient could have signs and symptoms consistent with a possible labral tear and I would like further evaluation with MRI if patient does not make improvement.  Patient is in agreement with this and will send [place] a message in 2 weeks.

## 2020-04-08 NOTE — Assessment & Plan Note (Signed)
Moderate arthritic changes, responded well to the injection.  Had improvement in range of motion almost immediately.  Worsening pain advanced imaging is warranted.

## 2020-04-08 NOTE — Progress Notes (Signed)
[place] Phone: [phone] Subjective:   Christopher [name], am serving as a scribe for Dr. Hulan Saas. This visit occurred during the SARS-CoV-2 public health emergency.  Safety protocols were in place, including screening questions prior to the visit, additional usage of staff PPE, and extensive cleaning of exam room while observing appropriate contact time as indicated for disinfecting solutions.   I'm seeing this patient by the request  of:  Libby Maw, MD  CC: Right shoulder pain follow-up  [id number]   03/18/2020 Patient has made improvement but still having some discomfort.  Did not want to do another steroid injection today but will consider in 3 weeks.  At that time ultrasound and decide on the glenohumeral and potentially acromioclavicular injections.  If that does not help then we will need to consider the possibility of an MRI to further evaluate for possible labral pathology that could also be contributing.  Chronic problem with exacerbation.  Has responded well to the injections previously.  Does do well for about 3 months and then will get viscosupplementation approval.  We discussed icing regimen and home exercises, icing regimen encouraged still to monitor weight.  Follow-up again in 3 months  Update 04/08/2020 Christopher [name]. is a 70 y.o. male coming in with complaint of right shoulder and bilateral knee pain. Patient states that the knees are feeling good since last visit.   Continues to have tightness in posterior aspect of shoulder. Problems with horizontal adduction.  Patient states that it seems to be getting tighter at this moment.  He does not think he has made any significant improvement at the moment.  Been doing the exercises fairly regularly.  States that it can be very uncomfortable at night as well.  Denies any radiation down the arm.      Past Medical History:   Diagnosis Date  . BENIGN PROSTATIC HYPERTROPHY 07/28/2006  . GERD 07/28/2006  . HYPERLIPIDEMIA 07/28/2006  . NEOPLASM, MALIGNANT, PROSTATE 08/01/2007  . NISSEN FUNDOPLICATION, HX OF 3/76/2831  . Unspecified essential hypertension 03/27/2007   Past Surgical History:  Procedure Laterality Date  . NISSEN FUNDOPLICATION  5176  . PROSTATECTOMY  2009  . TONSILLECTOMY     Social History   Socioeconomic History  . Marital status: Married    Spouse name: Not on file  . Number of children: Not on file  . Years of education: Not on file  . Highest education level: Not on file  Occupational History  . Not on file  Tobacco Use  . Smoking status: Former Research scientist (life sciences)  . Smokeless tobacco: Never Used  . Tobacco comment: smoked in college  Substance and Sexual Activity  . Alcohol use: Yes    Alcohol/week: 0.0 standard drinks    Comment: rarely; once or twice yearly  . Drug use: [name]  . Sexual activity: Not on file  Other Topics Concern  . Not on file  Social History Narrative  . Not on file   Social Determinants of Health   Financial Resource Strain: Low Risk   . Difficulty of Paying Living Expenses: Not hard at all  Food Insecurity: [name] Food Insecurity  . Worried About Charity fundraiser in the Last Year: Never true  . Ran Out of Food in the Last Year: Never true  Transportation Needs: [name] Transportation Needs  . Lack of Transportation (Medical): [name]  . Lack of Transportation (Non-Medical): [name]  Physical Activity: Sufficiently  Active  . Days of Exercise per Week: 5 days  . Minutes of Exercise per Session: 30 min  Stress: [name] Stress Concern Present  . Feeling of Stress : Not at all  Social Connections: Moderately Isolated  . Frequency of Communication with Friends and Family: More than three times a week  . Frequency of Social Gatherings with Friends and Family: Once a week  . Attends Religious Services: Never  . Active Member of Clubs or Organizations: [name]  . Attends Archivist  Meetings: Never  . Marital Status: Married   [name] Known Allergies Family History  Problem Relation Age of Onset  . Heart disease Mother   . Prostate cancer Father        prostate  . Colon cancer Neg Hx      Current Outpatient Medications (Cardiovascular):  .  atorvastatin (LIPITOR) 40 MG tablet, TAKE 1 TABLET BY MOUTH EVERY DAY .  losartan-hydrochlorothiazide (HYZAAR) 100-12.5 MG tablet, TAKE 1 TABLET BY MOUTH EVERY DAY     Current Outpatient Medications (Other):  Marland Kitchen  ALPRAZolam (XANAX) 0.5 MG tablet, TAKE 1 TABLET BY MOUTH TWICE A DAY AS NEEDED .  carbamide peroxide (DEBROX) 6.5 % OTIC solution, Place 5 drops into the right ear 2 (two) times daily. .  cholecalciferol (VITAMIN D) 1000 units tablet, Take 2,000 Units by mouth daily. .  Turmeric 500 MG CAPS, Take 2 capsules by mouth daily.   Reviewed prior external information including notes and imaging from  primary care provider As well as notes that were available from care everywhere and other healthcare systems.  Past medical history, social, surgical and family history all reviewed in electronic medical record.  [name] pertanent information unless stated regarding to the chief complaint.   Review of Systems:  [name] headache, visual changes, nausea, vomiting, diarrhea, constipation, dizziness, abdominal pain, skin rash, fevers, chills, night sweats, weight loss, swollen lymph nodes, body aches, joint swelling, chest pain, shortness of breath, mood changes. POSITIVE muscle aches  Objective  Blood pressure 110/82, pulse 64, height 6\' 1"  (1.854 m), weight 238 lb (108 kg), SpO2 98 %.   General: [name] apparent distress alert and oriented x3 mood and affect normal, dressed appropriately.  HEENT: Pupils equal, extraocular movements intact  Respiratory: Patient's speak in full sentences and does not appear short of breath  Cardiovascular: [name] lower extremity edema, non tender, [name] erythema  Gait normal with good balance and coordination.   MSK:   Arthritic changes of multiple joints especially knees Patient's right shoulder does show the patient does have positive impingement noted with Neer and Hawkins.  Positive crossover noted.  Rotator cuff strength 4+ out of 5.  Lacks last 5 degrees of external range of motion.   Procedure: Real-time Ultrasound Guided Injection of right glenohumeral joint Device: GE Logiq Q7  Ultrasound guided injection is preferred based studies that show increased duration, increased effect, greater accuracy, decreased procedural pain, increased response rate with ultrasound guided versus blind injection.  Verbal informed consent obtained.  Time-out conducted.  Noted [name] overlying erythema, induration, or other signs of local infection.  Skin prepped in a sterile fashion.  Local anesthesia: Topical Ethyl chloride.  With sterile technique and under real time ultrasound guidance:  Joint visualized.  23g 1  inch needle inserted posterior approach. Pictures taken for needle placement. Patient did have injection of, 2 cc of 0.5% Marcaine, and 1.0 cc of Kenalog 40 mg/dL. Completed without difficulty  Pain immediately resolved suggesting accurate placement of the medication.  Advised to call if fevers/chills, erythema, induration, drainage, or persistent bleeding.  Impression: Technically successful ultrasound guided injection.  Procedure: Real-time Ultrasound Guided Injection of right acromioclavicular joint Device: GE Logiq Q7 Ultrasound guided injection is preferred based studies that show increased duration, increased effect, greater accuracy, decreased procedural pain, increased response rate, and decreased cost with ultrasound guided versus blind injection.  Verbal informed consent obtained.  Time-out conducted.  Noted [name] overlying erythema, induration, or other signs of local infection.  Skin prepped in a sterile fashion.  Local anesthesia: Topical Ethyl chloride.  With sterile technique and under  real time ultrasound guidance: With a 25-gauge half inch needle injected with 0.5 cc of 0.5% Marcaine and 0.5 cc of Kenalog 40 mg/mL Completed without difficulty  Pain immediately resolved suggesting accurate placement of the medication.  Advised to call if fevers/chills, erythema, induration, drainage, or persistent bleeding.  Impression: Technically successful ultrasound guided injection.    Impression and Recommendations:     The above documentation has been reviewed and is accurate and complete Lyndal Pulley, DO

## 2020-04-08 NOTE — Patient Instructions (Signed)
Send a message in a couple weeks and if not better we will order an MRI

## 2020-04-21 ENCOUNTER — Telehealth: Payer: Self-pay | Admitting: Family Medicine

## 2020-04-21 DIAGNOSIS — I10 Essential (primary) hypertension: Secondary | ICD-10-CM | POA: Diagnosis not present

## 2020-04-21 DIAGNOSIS — Z823 Family history of stroke: Secondary | ICD-10-CM | POA: Diagnosis not present

## 2020-04-21 DIAGNOSIS — Z8673 Personal history of transient ischemic attack (TIA), and cerebral infarction without residual deficits: Secondary | ICD-10-CM | POA: Diagnosis not present

## 2020-04-21 DIAGNOSIS — R69 Illness, unspecified: Secondary | ICD-10-CM | POA: Diagnosis not present

## 2020-04-21 DIAGNOSIS — E785 Hyperlipidemia, unspecified: Secondary | ICD-10-CM | POA: Diagnosis not present

## 2020-04-21 DIAGNOSIS — Z8546 Personal history of malignant neoplasm of prostate: Secondary | ICD-10-CM | POA: Diagnosis not present

## 2020-04-21 DIAGNOSIS — Z8249 Family history of ischemic heart disease and other diseases of the circulatory system: Secondary | ICD-10-CM | POA: Diagnosis not present

## 2020-04-21 DIAGNOSIS — E669 Obesity, unspecified: Secondary | ICD-10-CM | POA: Diagnosis not present

## 2020-04-21 DIAGNOSIS — Z809 Family history of malignant neoplasm, unspecified: Secondary | ICD-10-CM | POA: Diagnosis not present

## 2020-04-21 DIAGNOSIS — Z87891 Personal history of nicotine dependence: Secondary | ICD-10-CM | POA: Diagnosis not present

## 2020-04-21 NOTE — Telephone Encounter (Signed)
Patient called asking if you would be able to contact him to discuss his recent injections. He said he would be available before 10am.

## 2020-04-22 ENCOUNTER — Other Ambulatory Visit: Payer: Self-pay

## 2020-04-22 DIAGNOSIS — G8929 Other chronic pain: Secondary | ICD-10-CM

## 2020-04-22 NOTE — Telephone Encounter (Signed)
Spoke with patient. Injection did help but he continues to have sharp pain with sudden movements. Would like to proceed with MRI. Order placed and patient given [place] phone number.

## 2020-04-29 ENCOUNTER — Other Ambulatory Visit: Payer: Self-pay | Admitting: Family

## 2020-05-05 ENCOUNTER — Other Ambulatory Visit: Payer: Self-pay | Admitting: Family Medicine

## 2020-05-05 NOTE — Telephone Encounter (Signed)
Last OV 12/17/19 Last fill 02/08/20  #90/0

## 2020-05-15 ENCOUNTER — Ambulatory Visit
Admission: RE | Admit: 2020-05-15 | Discharge: 2020-05-15 | Disposition: A | Discharge status: Home / Self Care | Payer: Medicare HMO | Source: Ambulatory Visit | Attending: Family Medicine | Admitting: Family Medicine

## 2020-05-15 ENCOUNTER — Other Ambulatory Visit: Payer: Self-pay

## 2020-05-15 DIAGNOSIS — M25511 Pain in right shoulder: Secondary | ICD-10-CM | POA: Diagnosis not present

## 2020-05-15 DIAGNOSIS — G8929 Other chronic pain: Secondary | ICD-10-CM

## 2020-05-15 DIAGNOSIS — S46011A Strain of muscle(s) and tendon(s) of the rotator cuff of right shoulder, initial encounter: Secondary | ICD-10-CM | POA: Diagnosis not present

## 2020-05-15 MED ORDER — IOPAMIDOL (ISOVUE-M 200) INJECTION 41%
12.0000 mL | Freq: Once | INTRAMUSCULAR | Status: AC
  Administered 2020-05-15: 12 mL via INTRA_ARTICULAR

## 2020-05-16 ENCOUNTER — Telehealth: Payer: Self-pay

## 2020-05-16 ENCOUNTER — Encounter: Payer: Self-pay | Admitting: Family Medicine

## 2020-05-16 NOTE — Telephone Encounter (Signed)
Patient called back about MRI wanting to know with a small labral tear does that consist of a minor surgery, with a short time frame in a sling, and PT? or is it extensive surgery as if it was like a full labral tear, longer time in a sling and PT?  Patient tried to respond through mychart but could not get the "Send" button to work. Patient is okay with a response through mychart.

## 2020-06-02 ENCOUNTER — Other Ambulatory Visit: Payer: Self-pay | Admitting: Family

## 2020-06-04 NOTE — Progress Notes (Signed)
[facility] [address] [place] Phone: [phone] Subjective:   I Kandace Blitz am serving as a Education administrator for Dr. Hulan Saas.  This visit occurred during the SARS-CoV-2 public health emergency.  Safety protocols were in place, including screening questions prior to the visit, additional usage of staff PPE, and extensive cleaning of exam room while observing appropriate contact time as indicated for disinfecting solutions.   I'm seeing this patient by the request  of:  Libby Maw, MD  CC: Right shoulder pain follow-up  VOZ:[id number]   04/08/2020 Moderate arthritic changes, responded well to the injection.  Had improvement in range of motion almost immediately.  Worsening pain advanced imaging is warranted.  Patient given injection today.  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  Patient could have signs and symptoms consistent with a possible labral tear and I would like further evaluation with MRI if patient does not make improvement.  Patient is in agreement with this and will send [place] a message in 2 weeks.  Update 06/05/2020 [name] is a 70 y.o. male coming in with complaint of right shoulder pain. Patient here for injection. States 2 weeks ago he slammed his shoulder into something he couldn't see. About 4 or 5 days ago the pain started to get better. States his muscles went into spasm in the upper traps. Wants to know what he can and can't do.   Patient did have the MRI of the shoulder done.  This was independently visualized by me that patient did have a fairly decent sized SLAP tear noted.  Patient states though that was doing better and then unfortunately started having increasing pain when he had the injury again.  States that it feels about the same.  Patient would like to be active but is finding it difficult.     Past Medical History:  Diagnosis Date  . BENIGN PROSTATIC HYPERTROPHY 07/28/2006   . GERD 07/28/2006  . HYPERLIPIDEMIA 07/28/2006  . NEOPLASM, MALIGNANT, PROSTATE 08/01/2007  . NISSEN FUNDOPLICATION, HX OF 2/59/5638  . Unspecified essential hypertension 03/27/2007   Past Surgical History:  Procedure Laterality Date  . NISSEN FUNDOPLICATION  7564  . PROSTATECTOMY  2009  . TONSILLECTOMY     Social History   Socioeconomic History  . Marital status: Married    Spouse name: Not on file  . Number of children: Not on file  . Years of education: Not on file  . Highest education level: Not on file  Occupational History  . Not on file  Tobacco Use  . Smoking status: Former Research scientist (life sciences)  . Smokeless tobacco: Never Used  . Tobacco comment: smoked in college  Substance and Sexual Activity  . Alcohol use: Yes    Alcohol/week: 0.0 standard drinks    Comment: rarely; once or twice yearly  . Drug use: No  . Sexual activity: Not on file  Other Topics Concern  . Not on file  Social History Narrative  . Not on file   Social Determinants of Health   Financial Resource Strain: Low Risk   . Difficulty of Paying Living Expenses: Not hard at all  Food Insecurity: No Food Insecurity  . Worried About Charity fundraiser in the Last Year: Never true  . Ran Out of Food in the Last Year: Never true  Transportation Needs: No Transportation Needs  . Lack of Transportation (Medical): No  . Lack of Transportation (Non-Medical): No  Physical Activity:  Sufficiently Active  . Days of Exercise per Week: 5 days  . Minutes of Exercise per Session: 30 min  Stress: No Stress Concern Present  . Feeling of Stress : Not at all  Social Connections: Moderately Isolated  . Frequency of Communication with Friends and Family: More than three times a week  . Frequency of Social Gatherings with Friends and Family: Once a week  . Attends Religious Services: Never  . Active Member of Clubs or Organizations: No  . Attends Archivist Meetings: Never  . Marital Status: Married   No Known  Allergies Family History  Problem Relation Age of Onset  . Heart disease Mother   . Prostate cancer Father        prostate  . Colon cancer Neg Hx      Current Outpatient Medications (Cardiovascular):  .  atorvastatin (LIPITOR) 40 MG tablet, TAKE 1 TABLET BY MOUTH EVERY DAY .  losartan-hydrochlorothiazide (HYZAAR) 100-12.5 MG tablet, TAKE 1 TABLET BY MOUTH EVERY DAY     Current Outpatient Medications (Other):  Marland Kitchen  ALPRAZolam (XANAX) 0.5 MG tablet, TAKE 1 TABLET BY MOUTH TWICE A DAY AS NEEDED .  carbamide peroxide (DEBROX) 6.5 % OTIC solution, Place 5 drops into the right ear 2 (two) times daily. .  cholecalciferol (VITAMIN D) 1000 units tablet, Take 2,000 Units by mouth daily. .  Turmeric 500 MG CAPS, Take 2 capsules by mouth daily.   Reviewed prior external information including notes and imaging from  primary care provider As well as notes that were available from care everywhere and other healthcare systems.  Past medical history, social, surgical and family history all reviewed in electronic medical record.  No pertanent information unless stated regarding to the chief complaint.   Review of Systems:  No headache, visual changes, nausea, vomiting, diarrhea, constipation, dizziness, abdominal pain, skin rash, fevers, chills, night sweats, weight loss, swollen lymph nodes, body aches, joint swelling, chest pain, shortness of breath, mood changes. POSITIVE muscle aches  Objective  Blood pressure 140/82, pulse 84, height 6\' 1"  (1.854 m), weight 241 lb (109.3 kg), SpO2 99 %.   General: No apparent distress alert and oriented x3 mood and affect normal, dressed appropriately.  HEENT: Pupils equal, extraocular movements intact  Respiratory: Patient's speak in full sentences and does not appear short of breath  Cardiovascular: No lower extremity edema, non tender, no erythema  Gait normal with good balance and coordination.  MSK: Right shoulder exam shows the patient does have a  mild positive crossover and a mild positive O'Brien's.  Rotator cuff strength is intact though. \ Procedure: Real-time Ultrasound Guided Injection of right glenohumeral joint Device: GE Logiq Q7  Ultrasound guided injection is preferred based studies that show increased duration, increased effect, greater accuracy, decreased procedural pain, increased response rate with ultrasound guided versus blind injection.  Verbal informed consent obtained.  Time-out conducted.  Noted no overlying erythema, induration, or other signs of local infection.  Skin prepped in a sterile fashion.  Local anesthesia: Topical Ethyl chloride.  With sterile technique and under real time ultrasound guidance:  Joint visualized.  23g 1  inch needle inserted posterior approach. Pictures taken for needle placement. Patient did have injection of , 2 cc of 0.5% Marcaine, and 1.0 cc of Kenalog 40 mg/dL. Completed without difficulty  Pain immediately resolved suggesting accurate placement of the medication.  Advised to call if fevers/chills, erythema, induration, drainage, or persistent bleeding.  Impression: Technically successful ultrasound guided injection.  Impression and Recommendations:     The above documentation has been reviewed and is accurate and complete Lyndal Pulley, DO

## 2020-06-05 ENCOUNTER — Ambulatory Visit: Payer: Self-pay

## 2020-06-05 ENCOUNTER — Encounter: Payer: Self-pay | Admitting: Family Medicine

## 2020-06-05 ENCOUNTER — Other Ambulatory Visit: Payer: Self-pay

## 2020-06-05 ENCOUNTER — Ambulatory Visit: Payer: Medicare HMO | Admitting: Family Medicine

## 2020-06-05 VITALS — BP 140/82 | HR 84 | Ht 73.0 in | Wt 241.0 lb

## 2020-06-05 DIAGNOSIS — G8929 Other chronic pain: Secondary | ICD-10-CM | POA: Diagnosis not present

## 2020-06-05 DIAGNOSIS — S43439A Superior glenoid labrum lesion of unspecified shoulder, initial encounter: Secondary | ICD-10-CM | POA: Insufficient documentation

## 2020-06-05 DIAGNOSIS — S43431A Superior glenoid labrum lesion of right shoulder, initial encounter: Secondary | ICD-10-CM | POA: Diagnosis not present

## 2020-06-05 DIAGNOSIS — M25511 Pain in right shoulder: Secondary | ICD-10-CM

## 2020-06-05 NOTE — Patient Instructions (Addendum)
   [employer] PT will call you Injected shoulder today See me again in 6-8 weeks

## 2020-06-05 NOTE — Assessment & Plan Note (Signed)
Patient given injection today and tolerated the procedure well.  With patient having this tear we will have patient start with formal physical therapy as well.  Patient wants to avoid any type of surgical intervention and is still taking care of his ailing wife.  Patient will follow up with me again for his knees in the near future and will discuss how patient is responding.

## 2020-06-09 NOTE — Addendum Note (Signed)
Addended by: Judy Pimple R on: 06/09/2020 01:26 PM   Modules accepted: Orders

## 2020-06-10 DIAGNOSIS — M6281 Muscle weakness (generalized): Secondary | ICD-10-CM | POA: Diagnosis not present

## 2020-06-10 DIAGNOSIS — M25511 Pain in right shoulder: Secondary | ICD-10-CM | POA: Diagnosis not present

## 2020-06-12 DIAGNOSIS — M6281 Muscle weakness (generalized): Secondary | ICD-10-CM | POA: Diagnosis not present

## 2020-06-12 DIAGNOSIS — M25511 Pain in right shoulder: Secondary | ICD-10-CM | POA: Diagnosis not present

## 2020-06-12 NOTE — Progress Notes (Signed)
[place] Phone: [phone] Subjective:   Christopher [name], am serving as a scribe for Dr. Hulan Saas. This visit occurred during the SARS-CoV-2 public health emergency.  Safety protocols were in place, including screening questions prior to the visit, additional usage of staff PPE, and extensive cleaning of exam room while observing appropriate contact time as indicated for disinfecting solutions.   I'm seeing this patient by the request  of:  Libby Maw, MD  CC: Bilateral knee pain  [id number]   06/05/2020 Patient given injection today and tolerated the procedure well.  With patient having this tear we will have patient start with formal physical therapy as well.  Patient wants to avoid any type of surgical intervention and is still taking care of his ailing wife.  Patient will follow up with me again for his knees in the near future and will discuss how patient is responding.  03/18/2020 Chronic problem with exacerbation.  Has responded well to the injections previously.  Does do well for about 3 months and then will get viscosupplementation approval.  We discussed icing regimen and home exercises, icing regimen encouraged still to monitor weight.  Follow-up again in 3 months   Update 06/17/2020 Christopher [name]. is a 70 y.o. male coming in with complaint of R shoulder and B knee pain. Synvisc authorized. Patient states that he usually gets a few months of relief.  Patient is looking to have some type of relief.  Finding difficult.  But does not want to have any surgical intervention.  Unable to be quite as active as he is normally been in the past     Past Medical History:  Diagnosis Date  . BENIGN PROSTATIC HYPERTROPHY 07/28/2006  . GERD 07/28/2006  . HYPERLIPIDEMIA 07/28/2006  . NEOPLASM, MALIGNANT, PROSTATE 08/01/2007  . NISSEN FUNDOPLICATION, HX OF 6/81/1572  . Unspecified essential  hypertension 03/27/2007   Past Surgical History:  Procedure Laterality Date  . NISSEN FUNDOPLICATION  6203  . PROSTATECTOMY  2009  . TONSILLECTOMY     Social History   Socioeconomic History  . Marital status: Married    Spouse name: Not on file  . Number of children: Not on file  . Years of education: Not on file  . Highest education level: Not on file  Occupational History  . Not on file  Tobacco Use  . Smoking status: Former Research scientist (life sciences)  . Smokeless tobacco: Never Used  . Tobacco comment: smoked in college  Substance and Sexual Activity  . Alcohol use: Yes    Alcohol/week: 0.0 standard drinks    Comment: rarely; once or twice yearly  . Drug use: [name]  . Sexual activity: Not on file  Other Topics Concern  . Not on file  Social History Narrative  . Not on file   Social Determinants of Health   Financial Resource Strain: Low Risk   . Difficulty of Paying Living Expenses: Not hard at all  Food Insecurity: [name] Food Insecurity  . Worried About Charity fundraiser in the Last Year: Never true  . Ran Out of Food in the Last Year: Never true  Transportation Needs: [name] Transportation Needs  . Lack of Transportation (Medical): [name]  . Lack of Transportation (Non-Medical): [name]  Physical Activity: Sufficiently Active  . Days of Exercise per Week: 5 days  . Minutes of Exercise per Session: 30 min  Stress: [name] Stress Concern Present  . Feeling of  Stress : Not at all  Social Connections: Moderately Isolated  . Frequency of Communication with Friends and Family: More than three times a week  . Frequency of Social Gatherings with Friends and Family: Once a week  . Attends Religious Services: Never  . Active Member of Clubs or Organizations: [name]  . Attends Archivist Meetings: Never  . Marital Status: Married   [name] Known Allergies Family History  Problem Relation Age of Onset  . Heart disease Mother   . Prostate cancer Father        prostate  . Colon cancer Neg Hx       Current Outpatient Medications (Cardiovascular):  .  atorvastatin (LIPITOR) 40 MG tablet, TAKE 1 TABLET BY MOUTH EVERY DAY .  losartan-hydrochlorothiazide (HYZAAR) 100-12.5 MG tablet, TAKE 1 TABLET BY MOUTH EVERY DAY     Current Outpatient Medications (Other):  Marland Kitchen  ALPRAZolam (XANAX) 0.5 MG tablet, TAKE 1 TABLET BY MOUTH TWICE A DAY AS NEEDED .  carbamide peroxide (DEBROX) 6.5 % OTIC solution, Place 5 drops into the right ear 2 (two) times daily. .  cholecalciferol (VITAMIN D) 1000 units tablet, Take 2,000 Units by mouth daily. .  Turmeric 500 MG CAPS, Take 2 capsules by mouth daily.   Reviewed prior external information including notes and imaging from  primary care provider As well as notes that were available from care everywhere and other healthcare systems.  Past medical history, social, surgical and family history all reviewed in electronic medical record.  [name] pertanent information unless stated regarding to the chief complaint.   Review of Systems:  [name] headache, visual changes, nausea, vomiting, diarrhea, constipation, dizziness, abdominal pain, skin rash, fevers, chills, night sweats, weight loss, swollen lymph nodes, body aches, joint swelling, chest pain, shortness of breath, mood changes. POSITIVE muscle aches  Objective  Blood pressure 120/78, pulse 76, height 6\' 1"  (1.854 m), weight 241 lb (109.3 kg), SpO2 98 %.   General: [name] apparent distress alert and oriented x3 mood and affect normal, dressed appropriately.  HEENT: Pupils equal, extraocular movements intact  Respiratory: Patient's speak in full sentences and does not appear short of breath  Cardiovascular: [name] lower extremity edema, non tender, [name] erythema  Gait antalgic  MSK: Bilateral knee exam shows the patient does have lateral tracking of the patella.  Mild instability with valgus and varus force.  Patient does have crepitus noted.  Abnormal thigh to calf ratio bilaterally.  After informed written and  verbal consent, patient was seated on exam table. Right knee was prepped with alcohol swab and utilizing anterolateral approach, patient's right knee space was injected with 48 mg per 5 mL of Synvisc One  (sodium hyaluronate) in a prefilled syringe was injected easily into the knee through a 22-gauge needle..Patient tolerated the procedure well without immediate complications.  After informed written and verbal consent, patient was seated on exam table. Left knee was prepped with alcohol swab and utilizing anterolateral approach, patient's left knee space was injected with 40 mg per 5 mL of Synvisc One(sodium hyaluronate) in a prefilled syringe was injected easily into the knee through a 22-gauge needle..Patient tolerated the procedure well without immediate complications.    Impression and Recommendations:     The above documentation has been reviewed and is accurate and complete Lyndal Pulley, DO

## 2020-06-17 ENCOUNTER — Ambulatory Visit (INDEPENDENT_AMBULATORY_CARE_PROVIDER_SITE_OTHER): Payer: Medicare HMO

## 2020-06-17 ENCOUNTER — Ambulatory Visit: Payer: Medicare HMO | Admitting: Family Medicine

## 2020-06-17 ENCOUNTER — Encounter: Payer: Self-pay | Admitting: Family Medicine

## 2020-06-17 ENCOUNTER — Other Ambulatory Visit: Payer: Self-pay

## 2020-06-17 VITALS — BP 120/78 | HR 76 | Ht 73.0 in | Wt 241.0 lb

## 2020-06-17 DIAGNOSIS — M170 Bilateral primary osteoarthritis of knee: Secondary | ICD-10-CM | POA: Diagnosis not present

## 2020-06-17 DIAGNOSIS — M25562 Pain in left knee: Secondary | ICD-10-CM

## 2020-06-17 DIAGNOSIS — G8929 Other chronic pain: Secondary | ICD-10-CM

## 2020-06-17 DIAGNOSIS — M25561 Pain in right knee: Secondary | ICD-10-CM | POA: Diagnosis not present

## 2020-06-17 NOTE — Patient Instructions (Addendum)
Gel injections today Can injection AC joint at next visit if shoulder is giving you trouble  Xray today Read about PRP See me again in 5-6 weeks

## 2020-06-17 NOTE — Assessment & Plan Note (Signed)
Chronic problem with exacerbation.  Given injections and hopefully will benefit some of it.  Repeat x-rays ordered today as well.  Discussed continuing the home exercises, continuing to monitor weight.  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  Follow-up with me again 6 weeks

## 2020-06-18 ENCOUNTER — Other Ambulatory Visit: Payer: Self-pay | Admitting: Family Medicine

## 2020-06-19 DIAGNOSIS — M25511 Pain in right shoulder: Secondary | ICD-10-CM | POA: Diagnosis not present

## 2020-06-19 DIAGNOSIS — M6281 Muscle weakness (generalized): Secondary | ICD-10-CM | POA: Diagnosis not present

## 2020-06-21 DIAGNOSIS — M25511 Pain in right shoulder: Secondary | ICD-10-CM | POA: Diagnosis not present

## 2020-06-21 DIAGNOSIS — M6281 Muscle weakness (generalized): Secondary | ICD-10-CM | POA: Diagnosis not present

## 2020-06-24 DIAGNOSIS — M6281 Muscle weakness (generalized): Secondary | ICD-10-CM | POA: Diagnosis not present

## 2020-06-24 DIAGNOSIS — M25511 Pain in right shoulder: Secondary | ICD-10-CM | POA: Diagnosis not present

## 2020-06-26 DIAGNOSIS — M25511 Pain in right shoulder: Secondary | ICD-10-CM | POA: Diagnosis not present

## 2020-06-26 DIAGNOSIS — M6281 Muscle weakness (generalized): Secondary | ICD-10-CM | POA: Diagnosis not present

## 2020-07-01 DIAGNOSIS — M25511 Pain in right shoulder: Secondary | ICD-10-CM | POA: Diagnosis not present

## 2020-07-01 DIAGNOSIS — M6281 Muscle weakness (generalized): Secondary | ICD-10-CM | POA: Diagnosis not present

## 2020-07-03 DIAGNOSIS — M25511 Pain in right shoulder: Secondary | ICD-10-CM | POA: Diagnosis not present

## 2020-07-03 DIAGNOSIS — M6281 Muscle weakness (generalized): Secondary | ICD-10-CM | POA: Diagnosis not present

## 2020-07-06 ENCOUNTER — Other Ambulatory Visit: Payer: Self-pay | Admitting: Family

## 2020-07-08 DIAGNOSIS — M6281 Muscle weakness (generalized): Secondary | ICD-10-CM | POA: Diagnosis not present

## 2020-07-08 DIAGNOSIS — M25511 Pain in right shoulder: Secondary | ICD-10-CM | POA: Diagnosis not present

## 2020-07-09 NOTE — Telephone Encounter (Signed)
Please see refill request.

## 2020-07-10 DIAGNOSIS — M25511 Pain in right shoulder: Secondary | ICD-10-CM | POA: Diagnosis not present

## 2020-07-10 DIAGNOSIS — M6281 Muscle weakness (generalized): Secondary | ICD-10-CM | POA: Diagnosis not present

## 2020-07-11 NOTE — Telephone Encounter (Signed)
Refill request for pending Rx patient completely out last OV 12/17/2019 last refill 06/03/2020. Appointment scheduled for follow up visit. Patient would like to know if medication could be sent in to cover until his OV?

## 2020-07-15 DIAGNOSIS — M25511 Pain in right shoulder: Secondary | ICD-10-CM | POA: Diagnosis not present

## 2020-07-15 DIAGNOSIS — M6281 Muscle weakness (generalized): Secondary | ICD-10-CM | POA: Diagnosis not present

## 2020-07-17 DIAGNOSIS — M25511 Pain in right shoulder: Secondary | ICD-10-CM | POA: Diagnosis not present

## 2020-07-17 DIAGNOSIS — M6281 Muscle weakness (generalized): Secondary | ICD-10-CM | POA: Diagnosis not present

## 2020-07-23 ENCOUNTER — Encounter: Payer: Self-pay | Admitting: Family Medicine

## 2020-07-23 ENCOUNTER — Other Ambulatory Visit: Payer: Self-pay

## 2020-07-23 ENCOUNTER — Ambulatory Visit (INDEPENDENT_AMBULATORY_CARE_PROVIDER_SITE_OTHER): Payer: Medicare HMO | Admitting: Family Medicine

## 2020-07-23 VITALS — BP 148/82 | HR 63 | Temp 96.9°F | Ht 73.0 in | Wt 242.6 lb

## 2020-07-23 DIAGNOSIS — E559 Vitamin D deficiency, unspecified: Secondary | ICD-10-CM | POA: Diagnosis not present

## 2020-07-23 DIAGNOSIS — Z8546 Personal history of malignant neoplasm of prostate: Secondary | ICD-10-CM

## 2020-07-23 DIAGNOSIS — I10 Essential (primary) hypertension: Secondary | ICD-10-CM | POA: Diagnosis not present

## 2020-07-23 DIAGNOSIS — E785 Hyperlipidemia, unspecified: Secondary | ICD-10-CM

## 2020-07-23 DIAGNOSIS — H6121 Impacted cerumen, right ear: Secondary | ICD-10-CM

## 2020-07-23 DIAGNOSIS — F1320 Sedative, hypnotic or anxiolytic dependence, uncomplicated: Secondary | ICD-10-CM

## 2020-07-23 DIAGNOSIS — I998 Other disorder of circulatory system: Secondary | ICD-10-CM | POA: Diagnosis not present

## 2020-07-23 DIAGNOSIS — F419 Anxiety disorder, unspecified: Secondary | ICD-10-CM | POA: Diagnosis not present

## 2020-07-23 DIAGNOSIS — R69 Illness, unspecified: Secondary | ICD-10-CM | POA: Diagnosis not present

## 2020-07-23 LAB — LIPID PANEL
Cholesterol: 145 mg/dL (ref 0–200)
HDL: 51.2 mg/dL (ref >39.00)
LDL Cholesterol: 79 mg/dL (ref 0–99)
NonHDL: 94.1
Total CHOL/HDL Ratio: 3
Triglycerides: 75 mg/dL (ref 0.0–149.0)
VLDL: 15 mg/dL (ref 0.0–40.0)

## 2020-07-23 LAB — COMPREHENSIVE METABOLIC PANEL
ALT: 25 U/L (ref 0–53)
AST: 21 U/L (ref 0–37)
Albumin: 4.5 g/dL (ref 3.5–5.2)
Alkaline Phosphatase: 87 U/L (ref 39–117)
BUN: 18 mg/dL (ref 6–23)
CO2: 27 mEq/L (ref 19–32)
Calcium: 9.3 mg/dL (ref 8.4–10.5)
Chloride: 102 mEq/L (ref 96–112)
Creatinine, Ser: 0.89 mg/dL (ref 0.40–1.50)
GFR: 87.14 mL/min (ref >60.00)
Glucose, Bld: 91 mg/dL (ref 70–99)
Potassium: 4.8 mEq/L (ref 3.5–5.1)
Sodium: 136 mEq/L (ref 135–145)
Total Bilirubin: 0.7 mg/dL (ref 0.2–1.2)
Total Protein: 6.4 g/dL (ref 6.0–8.3)

## 2020-07-23 LAB — CBC
HCT: 43.9 % (ref 39.0–52.0)
Hemoglobin: 14.7 g/dL (ref 13.0–17.0)
MCHC: 33.6 g/dL (ref 30.0–36.0)
MCV: 87.8 fl (ref 78.0–100.0)
Platelets: 116 10*3/uL — ABNORMAL LOW (ref 150.0–400.0)
RBC: 5 Mil/uL (ref 4.22–5.81)
RDW: 15 % (ref 11.5–15.5)
WBC: 5.5 10*3/uL (ref 4.0–10.5)

## 2020-07-23 LAB — URINALYSIS, ROUTINE W REFLEX MICROSCOPIC
Bilirubin Urine: NEGATIVE
Hgb urine dipstick: NEGATIVE
Ketones, ur: NEGATIVE
Leukocytes,Ua: NEGATIVE
Nitrite: NEGATIVE
RBC / HPF: NONE SEEN (ref 0–?)
Specific Gravity, Urine: 1.015 (ref 1.000–1.030)
Total Protein, Urine: NEGATIVE
Urine Glucose: NEGATIVE
Urobilinogen, UA: 0.2 (ref 0.0–1.0)
pH: 7.5 (ref 5.0–8.0)

## 2020-07-23 LAB — VITAMIN D 25 HYDROXY (VIT D DEFICIENCY, FRACTURES): VITD: 34.2 ng/mL (ref 30.00–100.00)

## 2020-07-23 MED ORDER — LOSARTAN POTASSIUM-HCTZ 100-25 MG PO TABS: 1.0000 | ORAL_TABLET | Freq: Every day | ORAL | 2 refills | Status: DC

## 2020-07-23 MED ORDER — ATORVASTATIN CALCIUM 40 MG PO TABS: 1.0000 | ORAL_TABLET | Freq: Every day | ORAL | 4 refills | Status: DC

## 2020-07-23 NOTE — Progress Notes (Signed)
Established Patient Office Visit  Subjective:  Patient ID: Christopher [name]., male    DOB: Apr 07, 1950  Age: 70 y.o. MRN: [id number]  CC:  Chief Complaint  Patient presents with  . Follow-up    6 month follow up on medications. No concerns. Patient fasting.     HPI Christopher [name]. presents for follow-up of hypertension, elevated cholesterol, vitamin D deficiency anxiety.  Uses Xanax as needed.  Weight is increased due to decreased back activity secondary to right shoulder pain with labral tear.  Blood pressure has been elevated at home similar to her reading today.  He continues taking his losartan with HCTZ daily.  No issues with atorvastatin.  He is taking 2000 international units of vitamin D daily.   Past Medical History:  Diagnosis Date  . BENIGN PROSTATIC HYPERTROPHY 07/28/2006  . GERD 07/28/2006  . HYPERLIPIDEMIA 07/28/2006  . NEOPLASM, MALIGNANT, PROSTATE 08/01/2007  . NISSEN FUNDOPLICATION, HX OF 0/16/0109  . Unspecified essential hypertension 03/27/2007    Past Surgical History:  Procedure Laterality Date  . NISSEN FUNDOPLICATION  3235  . PROSTATECTOMY  2009  . TONSILLECTOMY      Family History  Problem Relation Age of Onset  . Heart disease Mother   . Prostate cancer Father        prostate  . Colon cancer Neg Hx     Social History   Socioeconomic History  . Marital status: Married    Spouse name: Not on file  . Number of children: Not on file  . Years of education: Not on file  . Highest education level: Not on file  Occupational History  . Not on file  Tobacco Use  . Smoking status: Former Research scientist (life sciences)  . Smokeless tobacco: Never Used  . Tobacco comment: smoked in college  Substance and Sexual Activity  . Alcohol use: Yes    Alcohol/week: 0.0 standard drinks    Comment: rarely; once or twice yearly  . Drug use: No  . Sexual activity: Not on file  Other Topics Concern  . Not on file  Social History Narrative  . Not on file   Social Determinants of  Health   Financial Resource Strain: Low Risk   . Difficulty of Paying Living Expenses: Not hard at all  Food Insecurity: No Food Insecurity  . Worried About Charity fundraiser in the Last Year: Never true  . Ran Out of Food in the Last Year: Never true  Transportation Needs: No Transportation Needs  . Lack of Transportation (Medical): No  . Lack of Transportation (Non-Medical): No  Physical Activity: Sufficiently Active  . Days of Exercise per Week: 5 days  . Minutes of Exercise per Session: 30 min  Stress: No Stress Concern Present  . Feeling of Stress : Not at all  Social Connections: Moderately Isolated  . Frequency of Communication with Friends and Family: More than three times a week  . Frequency of Social Gatherings with Friends and Family: Once a week  . Attends Religious Services: Never  . Active Member of Clubs or Organizations: No  . Attends Archivist Meetings: Never  . Marital Status: Married  Human resources officer Violence: Not At Risk  . Fear of Current or Ex-Partner: No  . Emotionally Abused: No  . Physically Abused: No  . Sexually Abused: No    Outpatient Medications Prior to Visit  Medication Sig Dispense Refill  . ALPRAZolam (XANAX) 0.5 MG tablet TAKE 1 TABLET BY MOUTH TWICE  A DAY AS NEEDED 60 tablet 0  . cholecalciferol (VITAMIN D) 1000 units tablet Take 2,000 Units by mouth daily.    Marland Kitchen atorvastatin (LIPITOR) 40 MG tablet TAKE 1 TABLET BY MOUTH EVERY DAY 90 tablet 0  . losartan-hydrochlorothiazide (HYZAAR) 100-12.5 MG tablet TAKE 1 TABLET BY MOUTH EVERY DAY 90 tablet 1  . carbamide peroxide (DEBROX) 6.5 % OTIC solution Place 5 drops into the right ear 2 (two) times daily. 15 mL 0  . Turmeric 500 MG CAPS Take 2 capsules by mouth daily. (Patient not taking: Reported on 07/23/2020)     No facility-administered medications prior to visit.    No Known Allergies  ROS Review of Systems  Constitutional: Negative.   HENT: Negative.   Eyes: Negative for  photophobia and visual disturbance.  Respiratory: Negative.   Cardiovascular: Negative.   Gastrointestinal: Negative.   Endocrine: Negative for polyphagia and polyuria.  Genitourinary: Negative for difficulty urinating and frequency.  Musculoskeletal: Positive for arthralgias.  Neurological: Negative for speech difficulty and weakness.  Psychiatric/Behavioral: The patient is nervous/anxious.       Objective:    Physical Exam Vitals and nursing note reviewed.  Constitutional:      General: He is not in acute distress.    Appearance: Normal appearance. He is obese. He is not ill-appearing, toxic-appearing or diaphoretic.  HENT:     Head: Normocephalic and atraumatic.     Right Ear: Tympanic membrane, ear canal and external ear normal.     Left Ear: Tympanic membrane, ear canal and external ear normal.     Mouth/Throat:     Mouth: Mucous membranes are moist.     Pharynx: Oropharynx is clear. No oropharyngeal exudate or posterior oropharyngeal erythema.  Eyes:     General: No scleral icterus.       Right eye: No discharge.        Left eye: No discharge.     Extraocular Movements: Extraocular movements intact.     Conjunctiva/sclera: Conjunctivae normal.     Pupils: Pupils are equal, round, and reactive to light.  Neck:     Vascular: No carotid bruit.  Cardiovascular:     Rate and Rhythm: Normal rate and regular rhythm.  Pulmonary:     Effort: Pulmonary effort is normal.     Breath sounds: Normal breath sounds.  Abdominal:     General: Bowel sounds are normal.  Musculoskeletal:     Cervical back: No rigidity or tenderness.  Lymphadenopathy:     Cervical: No cervical adenopathy.  Skin:    General: Skin is warm and dry.  Neurological:     Mental Status: He is alert and oriented to person, place, and time.  Psychiatric:        Mood and Affect: Mood normal.        Behavior: Behavior normal.     BP (!) 148/82   Pulse 63   Temp (!) 96.9 F (36.1 C) (Temporal)   Ht 6'  1" (1.854 m)   Wt 242 lb 9.6 oz (110 kg)   SpO2 100%   BMI 32.01 kg/m  Wt Readings from Last 3 Encounters:  07/23/20 242 lb 9.6 oz (110 kg)  06/17/20 241 lb (109.3 kg)  06/05/20 241 lb (109.3 kg)     Health Maintenance Due  Topic Date Due  . Hepatitis C Screening  Never done  . PNA vac Low Risk Adult (2 of 2 - PPSV23) 12/22/2016    There are no preventive care reminders  to display for this patient.  Lab Results  Component Value Date   TSH 1.29 01/05/2017   Lab Results  Component Value Date   WBC 6.9 12/17/2019   HGB 14.8 12/17/2019   HCT 44.1 12/17/2019   MCV 89.2 12/17/2019   PLT 183.0 12/17/2019   Lab Results  Component Value Date   NA 135 12/17/2019   K 4.8 12/17/2019   CO2 25 12/17/2019   GLUCOSE 95 12/17/2019   BUN 18 12/17/2019   CREATININE 0.91 12/17/2019   BILITOT 0.6 12/17/2019   ALKPHOS 93 12/17/2019   AST 27 12/17/2019   ALT 34 12/17/2019   PROT 6.7 12/17/2019   ALBUMIN 4.4 12/17/2019   CALCIUM 9.5 12/17/2019   GFR 85.45 12/17/2019   Lab Results  Component Value Date   CHOL 147 12/17/2019   Lab Results  Component Value Date   HDL 49.90 12/17/2019   Lab Results  Component Value Date   LDLCALC 81 12/17/2019   Lab Results  Component Value Date   TRIG 80.0 12/17/2019   Lab Results  Component Value Date   CHOLHDL 3 12/17/2019   No results found for: HGBA1C    Assessment & Plan:   Problem List Items Addressed This Visit      Cardiovascular and Mediastinum   Essential hypertension   Relevant Medications   losartan-hydrochlorothiazide (HYZAAR) 100-25 MG tablet   atorvastatin (LIPITOR) 40 MG tablet     Nervous and Auditory   Ceruminosis, right     Other   Dyslipidemia   Relevant Medications   atorvastatin (LIPITOR) 40 MG tablet   Other Relevant Orders   Comprehensive metabolic panel   Lipid panel   Anxiety - Primary   Benzodiazepine dependence (HCC)   History of prostate cancer   Vitamin D deficiency   Relevant Orders    VITAMIN D 25 Hydroxy (Vit-D Deficiency, Fractures)   Poorly controlled blood pressure   Relevant Medications   losartan-hydrochlorothiazide (HYZAAR) 100-25 MG tablet   Other Relevant Orders   CBC   Comprehensive metabolic panel   Urinalysis, Routine w reflex microscopic      Meds ordered this encounter  Medications  . losartan-hydrochlorothiazide (HYZAAR) 100-25 MG tablet    Sig: Take 1 tablet by mouth daily.    Dispense:  100 tablet    Refill:  2  . atorvastatin (LIPITOR) 40 MG tablet    Sig: Take 1 tablet (40 mg total) by mouth daily.    Dispense:  90 tablet    Refill:  4    Follow-up: Return in about 3 months (around 10/23/2020).   Have increased losartan HCTZ to 100/25.  Patient will track and record his blood pressures follow-up in 3 months.  Continue atorvastatin.  Asked him to increase activity just by walking.  Continue shoulder exercises and follow-up with sports med for labral tear.  Alprazolam as needed.  Vitamin D will be adjusted pending results of today's check.  Continue Debrox solution for ceruminosis. Libby Maw, MD

## 2020-07-23 NOTE — Patient Instructions (Signed)
Managing Your Hypertension Hypertension, also called high blood pressure, is when the force of the blood pressing against the walls of the arteries is too strong. Arteries are blood vessels that carry blood from your heart throughout your body. Hypertension forces the heart to work harder to pump blood and may cause the arteries to become narrow or stiff. Understanding blood pressure readings Your personal target blood pressure may vary depending on your medical conditions, your age, and other factors. A blood pressure reading includes a higher number over a lower number. Ideally, your blood pressure should be below 120/80. You should know that:  The first, or top, number is called the systolic pressure. It is a measure of the pressure in your arteries as your heart beats.  The second, or bottom number, is called the diastolic pressure. It is a measure of the pressure in your arteries as the heart relaxes. Blood pressure is classified into four stages. Based on your blood pressure reading, your health care provider may use the following stages to determine what type of treatment you need, if any. Systolic pressure and diastolic pressure are measured in a unit called mmHg. Normal  Systolic pressure: below 120.  Diastolic pressure: below 80. Elevated  Systolic pressure: 120-129.  Diastolic pressure: below 80. Hypertension stage 1  Systolic pressure: 130-139.  Diastolic pressure: 80-89. Hypertension stage 2  Systolic pressure: 140 or above.  Diastolic pressure: 90 or above. How can this condition affect me? Managing your hypertension is an important responsibility. Over time, hypertension can damage the arteries and decrease blood flow to important parts of the body, including the brain, heart, and kidneys. Having untreated or uncontrolled hypertension can lead to:  A heart attack.  A stroke.  A weakened blood vessel (aneurysm).  Heart failure.  Kidney damage.  Eye  damage.  Metabolic syndrome.  Memory and concentration problems.  Vascular dementia. What actions can I take to manage this condition? Hypertension can be managed by making lifestyle changes and possibly by taking medicines. Your health care provider will help you make a plan to bring your blood pressure within a normal range. Nutrition  Eat a diet that is high in fiber and potassium, and low in salt (sodium), added sugar, and fat. An example eating plan is called the Dietary Approaches to Stop Hypertension (DASH) diet. To eat this way: ? Eat plenty of fresh fruits and vegetables. Try to fill one-half of your plate at each meal with fruits and vegetables. ? Eat whole grains, such as whole-wheat pasta, brown rice, or whole-grain bread. Fill about one-fourth of your plate with whole grains. ? Eat low-fat dairy products. ? Avoid fatty cuts of meat, processed or cured meats, and poultry with skin. Fill about one-fourth of your plate with lean proteins such as fish, chicken without skin, beans, eggs, and tofu. ? Avoid pre-made and processed foods. These tend to be higher in sodium, added sugar, and fat.  Reduce your daily sodium intake. Most people with hypertension should eat less than 1,500 mg of sodium a day.   Lifestyle  Work with your health care provider to maintain a healthy body weight or to lose weight. Ask what an ideal weight is for you.  Get at least 30 minutes of exercise that causes your heart to beat faster (aerobic exercise) most days of the week. Activities may include walking, swimming, or biking.  Include exercise to strengthen your muscles (resistance exercise), such as weight lifting, as part of your weekly exercise routine. Try   to do these types of exercises for 30 minutes at least 3 days a week.  Do not use any products that contain nicotine or tobacco, such as cigarettes, e-cigarettes, and chewing tobacco. If you need help quitting, ask your health care  provider.  Control any long-term (chronic) conditions you have, such as high cholesterol or diabetes.  Identify your sources of stress and find ways to manage stress. This may include meditation, deep breathing, or making time for fun activities.   Alcohol use  Do not drink alcohol if: ? Your health care provider tells you not to drink. ? You are pregnant, may be pregnant, or are planning to become pregnant.  If you drink alcohol: ? Limit how much you use to:  0-1 drink a day for women.  0-2 drinks a day for men. ? Be aware of how much alcohol is in your drink. In the [place] one drink equals one 12 oz bottle of beer (355 mL), one 5 oz glass of wine (148 mL), or one 1 oz glass of hard liquor (44 mL). Medicines Your health care provider may prescribe medicine if lifestyle changes are not enough to get your blood pressure under control and if:  Your systolic blood pressure is 130 or higher.  Your diastolic blood pressure is 80 or higher. Take medicines only as told by your health care provider. Follow the directions carefully. Blood pressure medicines must be taken as told by your health care provider. The medicine does not work as well when you skip doses. Skipping doses also puts you at risk for problems. Monitoring Before you monitor your blood pressure:  Do not smoke, drink caffeinated beverages, or exercise within 30 minutes before taking a measurement.  Use the bathroom and empty your bladder (urinate).  Sit quietly for at least 5 minutes before taking measurements. Monitor your blood pressure at home as told by your health care provider. To do this:  Sit with your back straight and supported.  Place your feet flat on the floor. Do not cross your legs.  Support your arm on a flat surface, such as a table. Make sure your upper arm is at heart level.  Each time you measure, take two or three readings one minute apart and record the results. You may also need to have your  blood pressure checked regularly by your health care provider.   General information  Talk with your health care provider about your diet, exercise habits, and other lifestyle factors that may be contributing to hypertension.  Review all the medicines you take with your health care provider because there may be side effects or interactions.  Keep all visits as told by your health care provider. Your health care provider can help you create and adjust your plan for managing your high blood pressure. Where to find more information  National Heart, Lung, and Blood Institute: www.nhlbi.nih.gov  American Heart Association: www.heart.org Contact a health care provider if:  You think you are having a reaction to medicines you have taken.  You have repeated (recurrent) headaches.  You feel dizzy.  You have swelling in your ankles.  You have trouble with your vision. Get help right away if:  You develop a severe headache or confusion.  You have unusual weakness or numbness, or you feel faint.  You have severe pain in your chest or abdomen.  You vomit repeatedly.  You have trouble breathing. These symptoms may represent a serious problem that is an emergency. Do not wait   to see if the symptoms will go away. Get medical help right away. Call your local emergency services (911 in the [place] Do not drive yourself to the hospital. Summary  Hypertension is when the force of blood pumping through your arteries is too strong. If this condition is not controlled, it may put you at risk for serious complications.  Your personal target blood pressure may vary depending on your medical conditions, your age, and other factors. For most people, a normal blood pressure is less than 120/80.  Hypertension is managed by lifestyle changes, medicines, or both.  Lifestyle changes to help manage hypertension include losing weight, eating a healthy, low-sodium diet, exercising more, stopping smoking, and  limiting alcohol. This information is not intended to replace advice given to you by your health care provider. Make sure you discuss any questions you have with your health care provider. Document Revised: 03/30/2019 Document Reviewed: 01/23/2019 Elsevier Patient Education  2021 Elsevier Inc.  

## 2020-07-24 ENCOUNTER — Ambulatory Visit: Payer: Medicare HMO | Admitting: Family Medicine

## 2020-07-24 NOTE — Telephone Encounter (Signed)
Probably not getting enough sun exposure. This would be another reason walk, as I suggested.

## 2020-07-29 NOTE — Progress Notes (Signed)
[place] Phone: [phone] Subjective:   Christopher [name], am serving as a scribe for Dr. Hulan [name]. This visit occurred during the SARS-CoV-2 public health emergency.  Safety protocols were in place, including screening questions prior to the visit, additional usage of staff PPE, and extensive cleaning of exam room while observing appropriate contact time as indicated for disinfecting solutions.   I'm seeing this patient by the request  of:  Christopher Maw, MD  CC: Bilateral knee pain follow-up, shoulder pain follow-up  [id number]   06/17/2020 Chronic problem with exacerbation.  Given injections and hopefully will benefit some of it.  Repeat x-rays ordered today as well.  Discussed continuing the home exercises, continuing to monitor weight.  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  Follow-up with me again 6 weeks  Update 07/30/2020 Christopher [name]. is a 70 y.o. male coming in with complaint of B knee pain. [facility] give last visit.  Last steroid injections were in January 2022. Patient did not have any relief from gel injections.  Patient would like to potentially have the knee injections again.   Patient states that his shoulder pain has improved. Continues to do shoulder exercises. Does note stiffness but [name] pain.        Patient has had a history of the shoulder pain as well.  Was found to have a SLAP tear noted.  Patient was given injection of the shoulder in June 05, 2020 this is the right shoulder.  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  Past Medical History:  Diagnosis Date  . BENIGN PROSTATIC HYPERTROPHY 07/28/2006  . GERD 07/28/2006  . HYPERLIPIDEMIA 07/28/2006  . NEOPLASM, MALIGNANT, PROSTATE 08/01/2007  . NISSEN FUNDOPLICATION, HX OF 10/24/2991  . Unspecified essential hypertension  03/27/2007   Past Surgical History:  Procedure Laterality Date  . NISSEN FUNDOPLICATION  7169  . PROSTATECTOMY  2009  . TONSILLECTOMY     Social History   Socioeconomic History  . Marital status: Married    Spouse name: Not on file  . Number of children: Not on file  . Years of education: Not on file  . Highest education level: Not on file  Occupational History  . Not on file  Tobacco Use  . Smoking status: Former Research scientist (life sciences)  . Smokeless tobacco: Never Used  . Tobacco comment: smoked in college  Substance and Sexual Activity  . Alcohol use: Yes    Alcohol/week: 0.0 standard drinks    Comment: rarely; once or twice yearly  . Drug use: [name]  . Sexual activity: Not on file  Other Topics Concern  . Not on file  Social History Narrative  . Not on file   Social Determinants of Health   Financial Resource Strain: Low Risk   . Difficulty of Paying Living Expenses: Not hard at all  Food Insecurity: [name] Food Insecurity  . Worried About Charity fundraiser in the Last Year: Never true  . Ran Out of Food in the Last Year: Never true  Transportation Needs: [name] Transportation Needs  . Lack of Transportation (Medical): [name]  . Lack of Transportation (Non-Medical): [name]  Physical Activity: Sufficiently Active  . Days of Exercise per Week: 5 days  . Minutes of Exercise per Session: 30 min  Stress: [name] Stress Concern Present  . Feeling of Stress : Not at all  Social  Connections: Moderately Isolated  . Frequency of Communication with Friends and Family: More than three times a week  . Frequency of Social Gatherings with Friends and Family: Once a week  . Attends Religious Services: Never  . Active Member of Clubs or Organizations: [name]  . Attends Archivist Meetings: Never  . Marital Status: Married   [name] Known Allergies Family History  Problem Relation Age of Onset  . Heart disease Mother   . Prostate cancer Father        prostate  . Colon cancer Neg Hx      Current  Outpatient Medications (Cardiovascular):  .  atorvastatin (LIPITOR) 40 MG tablet, Take 1 tablet (40 mg total) by mouth daily. Marland Kitchen  losartan-hydrochlorothiazide (HYZAAR) 100-25 MG tablet, Take 1 tablet by mouth daily.     Current Outpatient Medications (Other):  Marland Kitchen  ALPRAZolam (XANAX) 0.5 MG tablet, TAKE 1 TABLET BY MOUTH TWICE A DAY AS NEEDED .  cholecalciferol (VITAMIN D) 1000 units tablet, Take 2,000 Units by mouth daily.   Reviewed prior external information including notes and imaging from  primary care provider As well as notes that were available from care everywhere and other healthcare systems.  Past medical history, social, surgical and family history all reviewed in electronic medical record.  [name] pertanent information unless stated regarding to the chief complaint.   Review of Systems:  [name] headache, visual changes, nausea, vomiting, diarrhea, constipation, dizziness, abdominal pain, skin rash, fevers, chills, night sweats, weight loss, swollen lymph nodes,  chest pain, shortness of breath, mood changes. POSITIVE muscle aches, body aches and joint swelling  Objective  Blood pressure 108/78, pulse 71, height 6\' 1"  (1.854 m), weight 242 lb (109.8 kg), SpO2 99 %.   General: [name] apparent distress alert and oriented x3 mood and affect normal, dressed appropriately.  Overweight HEENT: Pupils equal, extraocular movements intact  Respiratory: Patient's speak in full sentences and does not appear short of breath  Cardiovascular: [name] lower extremity edema, non tender, [name] erythema  Gait antalgic Knee exam bilaterally does have some arthritic changes.  Trace effusion of the patellofemoral joint bilaterally.  Mild instability with valgus and varus force.  After informed written and verbal consent, patient was seated on exam table. Right knee was prepped with alcohol swab and utilizing anterolateral approach, patient's right knee space was injected with 4:1  marcaine 0.5%: Kenalog 40mg /dL.  Patient tolerated the procedure well without immediate complications.  After informed written and verbal consent, patient was seated on exam table. Left knee was prepped with alcohol swab and utilizing anterolateral approach, patient's left knee space was injected with 4:1  marcaine 0.5%: Kenalog 40mg /dL. Patient tolerated the procedure well without immediate complications.   Impression and Recommendations:     The above documentation has been reviewed and is accurate and complete Christopher Pulley, DO

## 2020-07-30 ENCOUNTER — Encounter: Payer: Self-pay | Admitting: Family Medicine

## 2020-07-30 ENCOUNTER — Ambulatory Visit: Payer: Medicare HMO | Admitting: Family Medicine

## 2020-07-30 ENCOUNTER — Ambulatory Visit: Payer: Self-pay

## 2020-07-30 ENCOUNTER — Other Ambulatory Visit: Payer: Self-pay

## 2020-07-30 VITALS — BP 108/78 | HR 71 | Ht 73.0 in | Wt 242.0 lb

## 2020-07-30 DIAGNOSIS — G8929 Other chronic pain: Secondary | ICD-10-CM | POA: Diagnosis not present

## 2020-07-30 DIAGNOSIS — M170 Bilateral primary osteoarthritis of knee: Secondary | ICD-10-CM

## 2020-07-30 DIAGNOSIS — S43431A Superior glenoid labrum lesion of right shoulder, initial encounter: Secondary | ICD-10-CM

## 2020-07-30 DIAGNOSIS — M25511 Pain in right shoulder: Secondary | ICD-10-CM

## 2020-07-30 NOTE — Patient Instructions (Addendum)
Injected both knees today Lets consider MRI or PRP See me again in 3 months

## 2020-07-30 NOTE — Assessment & Plan Note (Signed)
Chronic problem with exacerbation.  Patient given injections again.  We discussed the potential for MRI of the knees.  Patient declined that at this moment with him being still the primary caregiver for other..  Would not want any surgical intervention at this moment.  Discussed with patient about icing regimen.  Patient can have these injections every 3 months but would like advanced imaging to know for sure and see if patient could be a candidate for possible arthroscopic procedure.  Patient will consider it but will have a follow-up in 3 months

## 2020-07-30 NOTE — Assessment & Plan Note (Signed)
Patient is doing much better with physical therapy and injection.  We will continue to monitor.

## 2020-08-14 ENCOUNTER — Other Ambulatory Visit: Payer: Self-pay | Admitting: Family Medicine

## 2020-08-14 NOTE — Telephone Encounter (Signed)
Refill request for pending Rx last OV 07/23/20 last refill 07/11/20. Please advise

## 2020-09-22 ENCOUNTER — Other Ambulatory Visit: Payer: Self-pay | Admitting: Family Medicine

## 2020-10-13 ENCOUNTER — Encounter: Payer: Self-pay | Admitting: Family Medicine

## 2020-10-15 ENCOUNTER — Other Ambulatory Visit: Payer: Self-pay

## 2020-10-15 ENCOUNTER — Ambulatory Visit (INDEPENDENT_AMBULATORY_CARE_PROVIDER_SITE_OTHER): Payer: Medicare HMO | Admitting: Family Medicine

## 2020-10-15 ENCOUNTER — Encounter: Payer: Self-pay | Admitting: Family Medicine

## 2020-10-15 ENCOUNTER — Ambulatory Visit: Payer: Self-pay

## 2020-10-15 ENCOUNTER — Ambulatory Visit: Payer: Medicare HMO | Admitting: Family Medicine

## 2020-10-15 VITALS — BP 124/80 | HR 67 | Temp 97.2°F | Ht 73.0 in | Wt 240.2 lb

## 2020-10-15 VITALS — BP 116/82 | HR 63 | Ht 73.0 in | Wt 240.0 lb

## 2020-10-15 DIAGNOSIS — M170 Bilateral primary osteoarthritis of knee: Secondary | ICD-10-CM

## 2020-10-15 DIAGNOSIS — S43431D Superior glenoid labrum lesion of right shoulder, subsequent encounter: Secondary | ICD-10-CM

## 2020-10-15 DIAGNOSIS — F419 Anxiety disorder, unspecified: Secondary | ICD-10-CM

## 2020-10-15 DIAGNOSIS — M25511 Pain in right shoulder: Secondary | ICD-10-CM | POA: Diagnosis not present

## 2020-10-15 DIAGNOSIS — H6121 Impacted cerumen, right ear: Secondary | ICD-10-CM

## 2020-10-15 DIAGNOSIS — R69 Illness, unspecified: Secondary | ICD-10-CM | POA: Diagnosis not present

## 2020-10-15 DIAGNOSIS — E669 Obesity, unspecified: Secondary | ICD-10-CM | POA: Diagnosis not present

## 2020-10-15 DIAGNOSIS — D696 Thrombocytopenia, unspecified: Secondary | ICD-10-CM | POA: Diagnosis not present

## 2020-10-15 DIAGNOSIS — G8929 Other chronic pain: Secondary | ICD-10-CM

## 2020-10-15 DIAGNOSIS — I10 Essential (primary) hypertension: Secondary | ICD-10-CM

## 2020-10-15 LAB — CBC
HCT: 43.7 % (ref 39.0–52.0)
Hemoglobin: 14.7 g/dL (ref 13.0–17.0)
MCHC: 33.6 g/dL (ref 30.0–36.0)
MCV: 88.4 fl (ref 78.0–100.0)
Platelets: 108 10*3/uL — ABNORMAL LOW (ref 150.0–400.0)
RBC: 4.94 Mil/uL (ref 4.22–5.81)
RDW: 14.9 % (ref 11.5–15.5)
WBC: 5.7 10*3/uL (ref 4.0–10.5)

## 2020-10-15 NOTE — Assessment & Plan Note (Signed)
Patient feels her shoulder is still doing relatively well.  Continues to have mild improvement he states very slowly.

## 2020-10-15 NOTE — Assessment & Plan Note (Signed)
Patient given repeat injection again.  Procedure well.  Discussed icing regimen and home exercises.  Discussed avoiding certain activities.  Patient feels that the every 3 months seems to be doing relatively well.  Can consider the possibility again of viscosupplementation if needed as well as the PRP.  Patient will continue to consider this.  Thinking about PRP in February.  Follow-up again in 6 to 12 weeks

## 2020-10-15 NOTE — Patient Instructions (Signed)
Please see me back in 6-12 weeks Let us know if you want to do PRP

## 2020-10-15 NOTE — Progress Notes (Signed)
[facility] [place] Phone: [phone] Subjective:   Fontaine No, am serving as a scribe for Dr. Hulan Saas.  This visit occurred during the SARS-CoV-2 public health emergency.  Safety protocols were in place, including screening questions prior to the visit, additional usage of staff PPE, and extensive cleaning of exam room while observing appropriate contact time as indicated for disinfecting solutions.   I'm seeing this patient by the request  of:  Libby Maw, MD  CC: Shoulder and knee pain follow-up  [id number]  Angello Durrer. is a 70 y.o. male coming in with complaint of B knee and R shoulder pain. Patient states that he continues to have pain in anterior aspect. Some progress since last visit. Is able to play cornhole with minimal pain.   Patient at last visit was given bilateral knee injections in May 2022. Would like injections as knee pain is slowly increasing.   Patient felt like he was doing better with the shoulder with physical therapy and injection at last exam  Reviewed patient's previous x-rays of the knee in April of this year that were independently visualized by me showing moderate arthritic changes mostly of the medial compartment.  Past Medical History:  Diagnosis Date   BENIGN PROSTATIC HYPERTROPHY 07/28/2006   GERD 07/28/2006   HYPERLIPIDEMIA 07/28/2006   NEOPLASM, MALIGNANT, PROSTATE 0000000   NISSEN FUNDOPLICATION, HX OF Q000111Q   Unspecified essential hypertension 03/27/2007   Past Surgical History:  Procedure Laterality Date   NISSEN FUNDOPLICATION  99991111   PROSTATECTOMY  2009   TONSILLECTOMY     Social History   Socioeconomic History   Marital status: Married    Spouse name: Not on file   Number of children: Not on file   Years of education: Not on file   Highest education level: Not on file  Occupational History   Not on file  Tobacco Use   Smoking  status: Former   Smokeless tobacco: Never   Tobacco comments:    smoked in college  Substance and Sexual Activity   Alcohol use: Yes    Alcohol/week: 0.0 standard drinks    Comment: rarely; once or twice yearly   Drug use: No   Sexual activity: Not on file  Other Topics Concern   Not on file  Social History Narrative   Not on file   Social Determinants of Health   Financial Resource Strain: Low Risk    Difficulty of Paying Living Expenses: Not hard at all  Food Insecurity: No Food Insecurity   Worried About Charity fundraiser in the Last Year: Never true   [place] in the Last Year: Never true  Transportation Needs: No Transportation Needs   Lack of Transportation (Medical): No   Lack of Transportation (Non-Medical): No  Physical Activity: Sufficiently Active   Days of Exercise per Week: 5 days   Minutes of Exercise per Session: 30 min  Stress: No Stress Concern Present   Feeling of Stress : Not at all  Social Connections: Moderately Isolated   Frequency of Communication with Friends and Family: More than three times a week   Frequency of Social Gatherings with Friends and Family: Once a week   Attends Religious Services: Never   Marine scientist or Organizations: No   Attends Archivist Meetings: Never   Marital Status: Married   No Known Allergies Family History  Problem Relation Age  of Onset   Heart disease Mother    Prostate cancer Father        prostate   Colon cancer Neg Hx      Current Outpatient Medications (Cardiovascular):    atorvastatin (LIPITOR) 40 MG tablet, Take 1 tablet (40 mg total) by mouth daily.   losartan-hydrochlorothiazide (HYZAAR) 100-25 MG tablet, Take 1 tablet by mouth daily.     Current Outpatient Medications (Other):    ALPRAZolam (XANAX) 0.5 MG tablet, TAKE 1 TABLET BY MOUTH TWICE A DAY AS NEEDED   cholecalciferol (VITAMIN D) 1000 units tablet, Take 2,000 Units by mouth daily.   Reviewed prior external  information including notes and imaging from  primary care provider As well as notes that were available from care everywhere and other healthcare systems.  Past medical history, social, surgical and family history all reviewed in electronic medical record.  No pertanent information unless stated regarding to the chief complaint.   Review of Systems:  No headache, visual changes, nausea, vomiting, diarrhea, constipation, dizziness, abdominal pain, skin rash, fevers, chills, night sweats, weight loss, swollen lymph nodes, body aches, joint swelling, chest pain, shortness of breath, mood changes. POSITIVE muscle aches  Objective  Blood pressure 116/82, pulse 63, height '6\' 1"'$  (1.854 m), weight 240 lb (108.9 kg), SpO2 98 %.   General: No apparent distress alert and oriented x3 mood and affect normal, dressed appropriately.  HEENT: Pupils equal, extraocular movements intact  Respiratory: Patient's speak in full sentences and does not appear short of breath  Cardiovascular: No lower extremity edema, non tender, no erythema  Gait normal with good balance and coordination.  MSK: Bilateral knee exams show the patient does have a trace effusion noted.  Patient does have lacking the last 5 degrees. Patient's shoulder on the right side shows the patient still has a positive O'Brien's.  Mild positive crossover noted. 5/ 5 strength of the rotator cuff  After informed written and verbal consent, patient was seated on exam table. Right knee was prepped with alcohol swab and utilizing anterolateral approach, patient's right knee space was injected with 4:1  marcaine 0.5%: Kenalog '40mg'$ /dL. Patient tolerated the procedure well without immediate complications.  After informed written and verbal consent, patient was seated on exam table. Left knee was prepped with alcohol swab and utilizing anterolateral approach, patient's left knee space was injected with 4:1  marcaine 0.5%: Kenalog '40mg'$ /dL. Patient tolerated  the procedure well without immediate complications.   Impression and Recommendations:     The above documentation has been reviewed and is accurate and complete Lyndal Pulley, DO

## 2020-10-15 NOTE — Progress Notes (Addendum)
Established Patient Office Visit  Subjective:  Patient ID: Christopher [name]., male    DOB: 1951/02/22  Age: 70 y.o. MRN: [id number]  CC:  Chief Complaint  Patient presents with   Follow-up    3 month follow up on platelet levels. No concerns patient fasting.     HPI Argelis Favreau. presents for follow-up of hypertension, anxiety, cerumen gnosis and obesity.  Patient has been concerned about his weight.  Maintains that he eats healthy foods.  He does have a bedtime snack.  Blood pressures been under much better control with a higher dose of Hyzaar.  He has noticed decrease swelling in his lower extremities.  Continue Xanax as needed for anxiety.  He has been getting out of the house and walking and going fishing.  This is helped improve his mental outlook.  Past Medical History:  Diagnosis Date   BENIGN PROSTATIC HYPERTROPHY 07/28/2006   GERD 07/28/2006   HYPERLIPIDEMIA 07/28/2006   NEOPLASM, MALIGNANT, PROSTATE 0000000   NISSEN FUNDOPLICATION, HX OF Q000111Q   Unspecified essential hypertension 03/27/2007    Past Surgical History:  Procedure Laterality Date   NISSEN FUNDOPLICATION  99991111   PROSTATECTOMY  2009   TONSILLECTOMY      Family History  Problem Relation Age of Onset   Heart disease Mother    Prostate cancer Father        prostate   Colon cancer Neg Hx     Social History   Socioeconomic History   Marital status: Married    Spouse name: Not on file   Number of children: Not on file   Years of education: Not on file   Highest education level: Not on file  Occupational History   Not on file  Tobacco Use   Smoking status: Former   Smokeless tobacco: Never   Tobacco comments:    smoked in college  Substance and Sexual Activity   Alcohol use: Yes    Alcohol/week: 0.0 standard drinks    Comment: rarely; once or twice yearly   Drug use: No   Sexual activity: Not on file  Other Topics Concern   Not on file  Social History Narrative   Not on file    Social Determinants of Health   Financial Resource Strain: Low Risk    Difficulty of Paying Living Expenses: Not hard at all  Food Insecurity: No Food Insecurity   Worried About Charity fundraiser in the Last Year: Never true   [place] in the Last Year: Never true  Transportation Needs: No Transportation Needs   Lack of Transportation (Medical): No   Lack of Transportation (Non-Medical): No  Physical Activity: Sufficiently Active   Days of Exercise per Week: 5 days   Minutes of Exercise per Session: 30 min  Stress: No Stress Concern Present   Feeling of Stress : Not at all  Social Connections: Moderately Isolated   Frequency of Communication with Friends and Family: More than three times a week   Frequency of Social Gatherings with Friends and Family: Once a week   Attends Religious Services: Never   Marine scientist or Organizations: No   Attends Music therapist: Never   Marital Status: Married  Human resources officer Violence: Not At Risk   Fear of Current or Ex-Partner: No   Emotionally Abused: No   Physically Abused: No   Sexually Abused: No    Outpatient Medications Prior to Visit  Medication Sig  Dispense Refill   ALPRAZolam (XANAX) 0.5 MG tablet TAKE 1 TABLET BY MOUTH TWICE A DAY AS NEEDED 60 tablet 0   atorvastatin (LIPITOR) 40 MG tablet Take 1 tablet (40 mg total) by mouth daily. 90 tablet 4   cholecalciferol (VITAMIN D) 1000 units tablet Take 2,000 Units by mouth daily.     losartan-hydrochlorothiazide (HYZAAR) 100-25 MG tablet Take 1 tablet by mouth daily. 100 tablet 2   No facility-administered medications prior to visit.    No Known Allergies  ROS Review of Systems  Constitutional:  Negative for diaphoresis, fatigue, fever and unexpected weight change.  HENT: Negative.    Eyes:  Negative for photophobia.  Respiratory: Negative.    Cardiovascular: Negative.   Gastrointestinal: Negative.   Genitourinary: Negative.    Musculoskeletal:  Positive for arthralgias. Negative for gait problem and joint swelling.  Neurological:  Negative for speech difficulty and weakness.  Psychiatric/Behavioral:  The patient is nervous/anxious.      Objective:    Physical Exam Vitals and nursing note reviewed.  Constitutional:      General: He is not in acute distress.    Appearance: Normal appearance. He is obese. He is not ill-appearing, toxic-appearing or diaphoretic.  HENT:     Head: Normocephalic and atraumatic.     Right Ear: Tympanic membrane, ear canal and external ear normal.     Left Ear: Tympanic membrane, ear canal and external ear normal.     Ears:     Comments: There is decreased cerumen in both canals. Eyes:     General: No scleral icterus.       Right eye: No discharge.        Left eye: No discharge.     Extraocular Movements: Extraocular movements intact.     Conjunctiva/sclera: Conjunctivae normal.     Pupils: Pupils are equal, round, and reactive to light.  Cardiovascular:     Rate and Rhythm: Normal rate and regular rhythm.  Pulmonary:     Effort: Pulmonary effort is normal.     Breath sounds: Normal breath sounds.  Musculoskeletal:     Right lower leg: No edema.     Left lower leg: No edema.  Skin:    General: Skin is warm and dry.  Neurological:     Mental Status: He is alert and oriented to person, place, and time.  Psychiatric:        Mood and Affect: Mood normal.        Behavior: Behavior normal.    BP 124/80 (BP Location: Left Arm, Patient Position: Sitting, Cuff Size: Large)   Pulse 67   Temp (!) 97.2 F (36.2 C) (Temporal)   Ht '6\' 1"'$  (1.854 m)   Wt 240 lb 3.2 oz (109 kg)   SpO2 98%   BMI 31.69 kg/m  Wt Readings from Last 3 Encounters:  10/15/20 240 lb (108.9 kg)  10/15/20 240 lb 3.2 oz (109 kg)  07/30/20 242 lb (109.8 kg)     Health Maintenance Due  Topic Date Due   Hepatitis C Screening  Never done   Zoster Vaccines- Shingrix (1 of 2) Never done   PNA vac Low  Risk Adult (2 of 2 - PPSV23) 12/22/2016   INFLUENZA VACCINE  10/06/2020    There are no preventive care reminders to display for this patient.  Lab Results  Component Value Date   TSH 1.29 01/05/2017   Lab Results  Component Value Date   WBC 5.7 10/15/2020   HGB  14.7 10/15/2020   HCT 43.7 10/15/2020   MCV 88.4 10/15/2020   PLT 108.0 (L) 10/15/2020   Lab Results  Component Value Date   NA 136 07/23/2020   K 4.8 07/23/2020   CO2 27 07/23/2020   GLUCOSE 91 07/23/2020   BUN 18 07/23/2020   CREATININE 0.89 07/23/2020   BILITOT 0.7 07/23/2020   ALKPHOS 87 07/23/2020   AST 21 07/23/2020   ALT 25 07/23/2020   PROT 6.4 07/23/2020   ALBUMIN 4.5 07/23/2020   CALCIUM 9.3 07/23/2020   GFR 87.14 07/23/2020   Lab Results  Component Value Date   CHOL 145 07/23/2020   Lab Results  Component Value Date   HDL 51.20 07/23/2020   Lab Results  Component Value Date   LDLCALC 79 07/23/2020   Lab Results  Component Value Date   TRIG 75.0 07/23/2020   Lab Results  Component Value Date   CHOLHDL 3 07/23/2020   No results found for: HGBA1C    Assessment & Plan:   Problem List Items Addressed This Visit       Cardiovascular and Mediastinum   Essential hypertension     Nervous and Auditory   Ceruminosis, right     Other   Anxiety - Primary   Thrombocytopenia ([place])   Relevant Orders   CBC (Completed)   Ambulatory referral to Hematology / Oncology   Obesity (BMI 30-39.9)    No orders of the defined types were placed in this encounter.   Follow-up: Return in about 6 months (around 04/17/2021).  Continue higher dose of Hyzaar.  Continue Debrox drops.  Continue Xanax as needed.  Rechecking platelets today.  We discussed his weight today.  Asked him to consider a healthier bedtime snack.  Congratulated him on his increased exercise.  Asked him to consider weight watchers.  He is aware of the [facility] weight loss program and may be interested in pursuing it.  He will let me  know.  Libby Maw, MD

## 2020-10-17 ENCOUNTER — Telehealth: Payer: Self-pay | Admitting: *Deleted

## 2020-10-17 ENCOUNTER — Encounter: Payer: Self-pay | Admitting: Family Medicine

## 2020-10-17 NOTE — Telephone Encounter (Signed)
Per referral Dr. Ethelene Hal - called and gave upcoming appointments - confirmed - mailed welcome packet with calendar

## 2020-10-17 NOTE — Addendum Note (Signed)
Addended by: Jon Billings on: 10/17/2020 08:09 AM   Modules accepted: Orders

## 2020-10-28 ENCOUNTER — Other Ambulatory Visit: Payer: Self-pay | Admitting: Family Medicine

## 2020-10-30 ENCOUNTER — Inpatient Hospital Stay: Payer: Medicare HMO | Admitting: Hematology & Oncology

## 2020-10-30 ENCOUNTER — Inpatient Hospital Stay: Payer: Medicare HMO

## 2020-12-05 ENCOUNTER — Other Ambulatory Visit: Payer: Self-pay | Admitting: Family Medicine

## 2020-12-11 ENCOUNTER — Inpatient Hospital Stay: Payer: Medicare HMO | Attending: Hematology & Oncology

## 2020-12-11 ENCOUNTER — Other Ambulatory Visit: Payer: Self-pay

## 2020-12-11 ENCOUNTER — Encounter: Payer: Self-pay | Admitting: Hematology & Oncology

## 2020-12-11 ENCOUNTER — Inpatient Hospital Stay: Payer: Medicare HMO | Admitting: Hematology & Oncology

## 2020-12-11 VITALS — BP 133/88 | HR 69 | Temp 97.7°F | Resp 18 | Ht 73.0 in | Wt 233.8 lb

## 2020-12-11 DIAGNOSIS — Z87891 Personal history of nicotine dependence: Secondary | ICD-10-CM | POA: Insufficient documentation

## 2020-12-11 DIAGNOSIS — Z8546 Personal history of malignant neoplasm of prostate: Secondary | ICD-10-CM | POA: Insufficient documentation

## 2020-12-11 DIAGNOSIS — D696 Thrombocytopenia, unspecified: Secondary | ICD-10-CM | POA: Diagnosis not present

## 2020-12-11 DIAGNOSIS — Z9079 Acquired absence of other genital organ(s): Secondary | ICD-10-CM | POA: Insufficient documentation

## 2020-12-11 DIAGNOSIS — Z8616 Personal history of COVID-19: Secondary | ICD-10-CM | POA: Diagnosis not present

## 2020-12-11 DIAGNOSIS — Z8042 Family history of malignant neoplasm of prostate: Secondary | ICD-10-CM | POA: Insufficient documentation

## 2020-12-11 LAB — CBC WITH DIFFERENTIAL (CANCER CENTER ONLY)
Abs Immature Granulocytes: 0.21 10*3/uL — ABNORMAL HIGH (ref 0.00–0.07)
Basophils Absolute: 0 10*3/uL (ref 0.0–0.1)
Basophils Relative: 0 %
Eosinophils Absolute: 0 10*3/uL (ref 0.0–0.5)
Eosinophils Relative: 0 %
HCT: 41.5 % (ref 39.0–52.0)
Hemoglobin: 13.7 g/dL (ref 13.0–17.0)
Immature Granulocytes: 3 %
Lymphocytes Relative: 26 %
Lymphs Abs: 2 10*3/uL (ref 0.7–4.0)
MCH: 29.5 pg (ref 26.0–34.0)
MCHC: 33 g/dL (ref 30.0–36.0)
MCV: 89.4 fL (ref 80.0–100.0)
Monocytes Absolute: 0.9 10*3/uL (ref 0.1–1.0)
Monocytes Relative: 11 %
Neutro Abs: 4.7 10*3/uL (ref 1.7–7.7)
Neutrophils Relative %: 60 %
Platelet Count: 148 10*3/uL — ABNORMAL LOW (ref 150–400)
RBC: 4.64 MIL/uL (ref 4.22–5.81)
RDW: 14.5 % (ref 11.5–15.5)
WBC Count: 7.9 10*3/uL (ref 4.0–10.5)
nRBC: 0 % (ref 0.0–0.2)

## 2020-12-11 LAB — CMP (CANCER CENTER ONLY)
ALT: 21 U/L (ref 0–44)
AST: 20 U/L (ref 15–41)
Albumin: 4.2 g/dL (ref 3.5–5.0)
Alkaline Phosphatase: 83 U/L (ref 38–126)
Anion gap: 6 (ref 5–15)
BUN: 18 mg/dL (ref 8–23)
CO2: 29 mmol/L (ref 22–32)
Calcium: 9.6 mg/dL (ref 8.9–10.3)
Chloride: 101 mmol/L (ref 98–111)
Creatinine: 0.89 mg/dL (ref 0.61–1.24)
GFR, Estimated: >60 mL/min (ref >60)
Glucose, Bld: 95 mg/dL (ref 70–99)
Potassium: 5 mmol/L (ref 3.5–5.1)
Sodium: 136 mmol/L (ref 135–145)
Total Bilirubin: 0.6 mg/dL (ref 0.3–1.2)
Total Protein: 6.4 g/dL — ABNORMAL LOW (ref 6.5–8.1)

## 2020-12-11 LAB — SAVE SMEAR(SSMR), FOR PROVIDER SLIDE REVIEW

## 2020-12-11 NOTE — Progress Notes (Signed)
Referral MD  Reason for Referral: Transient thrombocytopenia  Chief Complaint  Patient presents with   New Patient (Initial Visit)  : I was told that my platelets were low.  HPI: Christopher [name] is a very nice 70 year old white male.  He is from [place].  He is retired.  He and his wife are enjoying their retirement.  Unfortunately, back in August he had COVID.  There were not hospitalized.    He is followed by Dr.Kremer.  He has noted that Christopher [name] has had a drop in his platelets.  Back in May, the platelet count was 116,000.  Prior to this, in October 2021, the platelet count was 183,000.  In August 2022, the platelet count 108,000.  He had a normal white cell count of 5.7 and hemoglobin was 14.7.  The MCV was 88.  Because of this, Dr. Ethelene Hal wished for a hematologic evaluation.  Christopher [name] does not smoke.  He does not drink.  He has not had no past surgeries.  He did have past prostate cancer surgery.  This was back in 2009.  He has had no bleeding.  There is been no bruising.  He has had no rashes.  There is been no change in medications.  He really does not take much in the way of medications.  He does not do supplements.  He has had no problems with fever, I saw that with the [place].  He has had no obvious change in bowel or bladder habits.  His last colonoscopy was 5 years ago.  There is been no cough.  He has had no mouth sores.  He has had no swollen lymph nodes.  Overall, I would say his performance status is ECOG 0.    Past Medical History:  Diagnosis Date   BENIGN PROSTATIC HYPERTROPHY 07/28/2006   GERD 07/28/2006   HYPERLIPIDEMIA 07/28/2006   NEOPLASM, MALIGNANT, PROSTATE 10/16/9145   NISSEN FUNDOPLICATION, HX OF 11/04/5619   Unspecified essential hypertension 03/27/2007  :   Past Surgical History:  Procedure Laterality Date   NISSEN FUNDOPLICATION  3086   PROSTATECTOMY  2009   TONSILLECTOMY    :   Current Outpatient Medications:    ALPRAZolam (XANAX) 0.5 MG  tablet, TAKE 1 TABLET BY MOUTH TWICE A DAY AS NEEDED, Disp: 60 tablet, Rfl: 0   atorvastatin (LIPITOR) 40 MG tablet, Take 1 tablet (40 mg total) by mouth daily., Disp: 90 tablet, Rfl: 4   cholecalciferol (VITAMIN D) 1000 units tablet, Take 2,000 Units by mouth daily., Disp: , Rfl:    losartan-hydrochlorothiazide (HYZAAR) 100-25 MG tablet, Take 1 tablet by mouth daily., Disp: 100 tablet, Rfl: 2:  :  No Known Allergies:   Family History  Problem Relation Age of Onset   Heart disease Mother    Prostate cancer Father        prostate   Colon cancer Neg Hx   :   Social History   Socioeconomic History   Marital status: Married    Spouse name: Not on file   Number of children: Not on file   Years of education: Not on file   Highest education level: Not on file  Occupational History   Not on file  Tobacco Use   Smoking status: Former   Smokeless tobacco: Never   Tobacco comments:    smoked in college  Vaping Use   Vaping Use: Never used  Substance and Sexual Activity   Alcohol use: Yes    Alcohol/week: 0.0 standard drinks  Comment: rarely; once or twice yearly   Drug use: No   Sexual activity: Not on file  Other Topics Concern   Not on file  Social History Narrative   Not on file   Social Determinants of Health   Financial Resource Strain: Low Risk    Difficulty of Paying Living Expenses: Not hard at all  Food Insecurity: No Food Insecurity   Worried About [place] in the Last Year: Never true   [place] in the Last Year: Never true  Transportation Needs: No Transportation Needs   Lack of Transportation (Medical): No   Lack of Transportation (Non-Medical): No  Physical Activity: Sufficiently Active   Days of Exercise per Week: 5 days   Minutes of Exercise per Session: 30 min  Stress: No Stress Concern Present   Feeling of Stress : Not at all  Social Connections: Moderately Isolated   Frequency of Communication with Friends and Family: More  than three times a week   Frequency of Social Gatherings with Friends and Family: Once a week   Attends Religious Services: Never   Marine scientist or Organizations: No   Attends Music therapist: Never   Marital Status: Married  Human resources officer Violence: Not At Risk   Fear of Current or Ex-Partner: No   Emotionally Abused: No   Physically Abused: No   Sexually Abused: No  :  Review of Systems  Constitutional: Negative.   HENT: Negative.    Eyes: Negative.   Respiratory: Negative.    Cardiovascular: Negative.   Gastrointestinal: Negative.   Genitourinary: Negative.   Musculoskeletal: Negative.   Skin: Negative.   Neurological: Negative.   Endo/Heme/Allergies: Negative.   Psychiatric/Behavioral: Negative.      Exam: '@IPVITALS' @ Physical Exam Vitals reviewed.  HENT:     Head: Normocephalic and atraumatic.  Eyes:     Pupils: Pupils are equal, round, and reactive to light.  Cardiovascular:     Rate and Rhythm: Normal rate and regular rhythm.     Heart sounds: Normal heart sounds.  Pulmonary:     Effort: Pulmonary effort is normal.     Breath sounds: Normal breath sounds.  Abdominal:     General: Bowel sounds are normal.     Palpations: Abdomen is soft.  Musculoskeletal:        General: No tenderness or deformity. Normal range of motion.     Cervical back: Normal range of motion.  Lymphadenopathy:     Cervical: No cervical adenopathy.  Skin:    General: Skin is warm and dry.     Findings: No erythema or rash.  Neurological:     Mental Status: He is alert and oriented to person, place, and time.  Psychiatric:        Behavior: Behavior normal.        Thought Content: Thought content normal.        Judgment: Judgment normal.     Recent Labs    12/11/20 1052  WBC 7.9  HGB 13.7  HCT 41.5  PLT 148*    Recent Labs    12/11/20 1052  NA 136  K 5.0  CL 101  CO2 29  GLUCOSE 95  BUN 18  CREATININE 0.89  CALCIUM 9.6    Blood smear  review: He has normochromic normocytic population of red blood cells.  There are no nucleated red blood cells.  He has no teardrop cells.  I see no schistocytes or spherocytes.  There are no target cells.  There is no rouleaux formation.  White blood cells.  Normal in morphology and maturation.  He has no hypersegmented polys.  There are no immature myeloid or lymphoid forms.  Platelets are adequate number and size.  He has numerous large platelets.  Platelets are well granulated.  Pathology: None    Assessment and Plan: Christopher [name] is a very nice 70 year old white male.  He had transient thrombocytopenia.  It is hard to say why he had this transient thrombocytopenia.  It started before he had COVID.  He did have the COVID vaccines.  I will know of these may have been a cause.  The platelets I see are quite large.  As such, I would think that what ever thrombocytopenia on he had, was from destruction or sequestration.  It appears that his bone marrow is likely making the platelets adequately.  I cannot find anything on his physical exam that would suggest a hematologic disorder.  I do not suspect anything like myelodysplasia.  I do not see a thing that would look like pernicious anemia.  I do not see anything that would suggest a lymphoproliferative disorder.  He clearly does not need to have a bone marrow biopsy done..  I do not see anything that would look like splenomegaly or liver disease.  From my point of view, I really think that he is okay.  I do not see any indication that he has underlying hematologic disorder.  As nice as he is, I really do not think we have to see him back in the clinic.  He has been followed quite closely by Dr.Kremer.  If there are any problems down the road, we will be more than happy to get him back for an evaluation.

## 2020-12-12 ENCOUNTER — Telehealth: Payer: Self-pay | Admitting: *Deleted

## 2020-12-12 NOTE — Telephone Encounter (Signed)
No 12/11/20 LOS

## 2020-12-22 ENCOUNTER — Encounter: Payer: Self-pay | Admitting: Family Medicine

## 2020-12-24 ENCOUNTER — Ambulatory Visit (INDEPENDENT_AMBULATORY_CARE_PROVIDER_SITE_OTHER): Payer: Medicare HMO | Admitting: Family Medicine

## 2020-12-24 ENCOUNTER — Encounter: Payer: Self-pay | Admitting: Family Medicine

## 2020-12-24 ENCOUNTER — Other Ambulatory Visit: Payer: Self-pay

## 2020-12-24 VITALS — BP 122/78 | HR 72 | Temp 97.4°F | Ht 73.0 in | Wt 231.4 lb

## 2020-12-24 DIAGNOSIS — R0982 Postnasal drip: Secondary | ICD-10-CM

## 2020-12-24 DIAGNOSIS — R69 Illness, unspecified: Secondary | ICD-10-CM | POA: Diagnosis not present

## 2020-12-24 DIAGNOSIS — Z23 Encounter for immunization: Secondary | ICD-10-CM

## 2020-12-24 DIAGNOSIS — H6983 Other specified disorders of Eustachian tube, bilateral: Secondary | ICD-10-CM | POA: Diagnosis not present

## 2020-12-24 DIAGNOSIS — Z0000 Encounter for general adult medical examination without abnormal findings: Secondary | ICD-10-CM

## 2020-12-24 DIAGNOSIS — Z8546 Personal history of malignant neoplasm of prostate: Secondary | ICD-10-CM | POA: Diagnosis not present

## 2020-12-24 DIAGNOSIS — F419 Anxiety disorder, unspecified: Secondary | ICD-10-CM

## 2020-12-24 MED ORDER — FLUTICASONE PROPIONATE 50 MCG/ACT NA SUSP: 2.0000 | Freq: Every day | NASAL | 6 refills | Status: DC

## 2020-12-24 NOTE — Progress Notes (Signed)
Established Patient Office Visit  Subjective:  Patient ID: Christopher [name]., male    DOB: [birth year]  Age: 70 y.o. MRN: [id number]  CC:  Chief Complaint  Patient presents with  . Follow-up    CPE, no concerns.     HPI Christopher [name]. presents for a physical exam, follow-up of anxiety, muffled hearing with postnasal drip.  Has been experiencing some stuffy nose with postnasal drip and muffled hearing.  Usually happens this time of year until we have had a couple of good freezes.  Anxiety has been controlled with as needed Xanax.  He is dependent upon it.  Denies depression.  He has been able to lose some weight.  He remains as active as he can be.  Past Medical History:  Diagnosis Date  . BENIGN PROSTATIC HYPERTROPHY 07/28/2006  . GERD 07/28/2006  . HYPERLIPIDEMIA 07/28/2006  . NEOPLASM, MALIGNANT, PROSTATE 08/01/2007  . NISSEN FUNDOPLICATION, HX OF 1/66/0630  . Unspecified essential hypertension 03/27/2007    Past Surgical History:  Procedure Laterality Date  . NISSEN FUNDOPLICATION  1601  . PROSTATECTOMY  2009  . TONSILLECTOMY      Family History  Problem Relation Age of Onset  . Heart disease Mother   . Prostate cancer Father        prostate  . Colon cancer Neg Hx     Social History   Socioeconomic History  . Marital status: Married    Spouse name: Not on file  . Number of children: Not on file  . Years of education: Not on file  . Highest education level: Not on file  Occupational History  . Not on file  Tobacco Use  . Smoking status: Former  . Smokeless tobacco: Never  . Tobacco comments:    smoked in college  Vaping Use  . Vaping Use: Never used  Substance and Sexual Activity  . Alcohol use: Yes    Alcohol/week: 0.0 standard drinks    Comment: rarely; once or twice yearly  . Drug use: No  . Sexual activity: Not on file  Other Topics Concern  . Not on file  Social History Narrative  . Not on file   Social Determinants of Health   Financial  Resource Strain: Low Risk   . Difficulty of Paying Living Expenses: Not hard at all  Food Insecurity: No Food Insecurity  . Worried About Charity fundraiser in the Last Year: Never true  . Ran Out of Food in the Last Year: Never true  Transportation Needs: No Transportation Needs  . Lack of Transportation (Medical): No  . Lack of Transportation (Non-Medical): No  Physical Activity: Sufficiently Active  . Days of Exercise per Week: 5 days  . Minutes of Exercise per Session: 30 min  Stress: No Stress Concern Present  . Feeling of Stress : Not at all  Social Connections: Moderately Isolated  . Frequency of Communication with Friends and Family: More than three times a week  . Frequency of Social Gatherings with Friends and Family: Once a week  . Attends Religious Services: Never  . Active Member of Clubs or Organizations: No  . Attends Archivist Meetings: Never  . Marital Status: Married  Human resources officer Violence: Not At Risk  . Fear of Current or Ex-Partner: No  . Emotionally Abused: No  . Physically Abused: No  . Sexually Abused: No    Outpatient Medications Prior to Visit  Medication Sig Dispense Refill  . ALPRAZolam Duanne Moron)  0.5 MG tablet TAKE 1 TABLET BY MOUTH TWICE A DAY AS NEEDED 60 tablet 0  . atorvastatin (LIPITOR) 40 MG tablet Take 1 tablet (40 mg total) by mouth daily. 90 tablet 4  . cholecalciferol (VITAMIN D) 1000 units tablet Take 2,000 Units by mouth daily.    Marland Kitchen losartan-hydrochlorothiazide (HYZAAR) 100-25 MG tablet Take 1 tablet by mouth daily. 100 tablet 2   No facility-administered medications prior to visit.    No Known Allergies  ROS Review of Systems  Constitutional:  Negative for chills, diaphoresis, fatigue, fever and unexpected weight change.  HENT:  Positive for congestion, hearing loss and postnasal drip. Negative for sore throat, trouble swallowing and voice change.   Respiratory:  Negative for cough, shortness of breath and wheezing.    Cardiovascular: Negative.   Gastrointestinal: Negative.  Negative for abdominal pain, anal bleeding and blood in stool.  Genitourinary:  Negative for difficulty urinating, frequency and urgency.  Musculoskeletal:  Negative for gait problem and joint swelling.  Skin: Negative.   Neurological:  Negative for speech difficulty and weakness.  Psychiatric/Behavioral:  Negative for dysphoric mood. The patient is not nervous/anxious.  So he will see me Depression screen [facility] 2/9 12/24/2020 10/15/2020 07/23/2020  Decreased Interest 0 0 0  Down, Depressed, Hopeless 0 0 0  PHQ - 2 Score 0 0 0  Altered sleeping - - -  Tired, decreased energy - - -  Change in appetite - - -  Feeling bad or failure about yourself  - - -  Trouble concentrating - - -  Moving slowly or fidgety/restless - - -  Suicidal thoughts - - -  PHQ-9 Score - - -      Objective:    Physical Exam Vitals and nursing note reviewed.  Constitutional:      General: He is not in acute distress.    Appearance: Normal appearance. He is obese. He is not ill-appearing, toxic-appearing or diaphoretic.  HENT:     Head: Normocephalic and atraumatic.     Right Ear: Ear canal and external ear normal.     Left Ear: Ear canal and external ear normal.     Mouth/Throat:     Mouth: Mucous membranes are moist.     Pharynx: Oropharynx is clear. No oropharyngeal exudate or posterior oropharyngeal erythema.  Eyes:     General: No scleral icterus.       Right eye: No discharge.        Left eye: No discharge.     Extraocular Movements: Extraocular movements intact.     Conjunctiva/sclera: Conjunctivae normal.     Pupils: Pupils are equal, round, and reactive to light.  Neck:     Vascular: No carotid bruit.  Cardiovascular:     Rate and Rhythm: Normal rate and regular rhythm.     Pulses: Normal pulses.     Heart sounds: Normal heart sounds.  Abdominal:     General: Bowel sounds are normal. There is no distension.     Palpations: Abdomen is  soft. There is no mass.     Tenderness: There is no abdominal tenderness. There is no guarding or rebound.     Hernia: No hernia is present.  Musculoskeletal:     Cervical back: No rigidity or tenderness.  Lymphadenopathy:     Cervical: No cervical adenopathy.  Skin:    General: Skin is warm and dry.  Neurological:     Mental Status: He is alert and oriented to person, place, and time.  Psychiatric:        Mood and Affect: Mood normal.        Behavior: Behavior normal.    BP 122/78 (BP Location: Right Arm, Patient Position: Sitting, Cuff Size: Normal)   Pulse 72   Temp (!) 97.4 F (36.3 C) (Temporal)   Ht 6\' 1"  (1.854 m)   Wt 231 lb 6.4 oz (105 kg)   SpO2 97%   BMI 30.53 kg/m  Wt Readings from Last 3 Encounters:  12/24/20 231 lb 6.4 oz (105 kg)  12/11/20 233 lb 12.8 oz (106.1 kg)  10/15/20 240 lb (108.9 kg)     Health Maintenance Due  Topic Date Due  . Hepatitis C Screening  Never done  . Zoster Vaccines- Shingrix (1 of 2) Never done  . Pneumonia Vaccine 31+ Years old (2 - PPSV23 or PCV20) 12/22/2016  . INFLUENZA VACCINE  10/06/2020    There are no preventive care reminders to display for this patient.  Lab Results  Component Value Date   TSH 1.29 01/05/2017   Lab Results  Component Value Date   WBC 7.9 12/11/2020   HGB 13.7 12/11/2020   HCT 41.5 12/11/2020   MCV 89.4 12/11/2020   PLT 148 (L) 12/11/2020   Lab Results  Component Value Date   NA 136 12/11/2020   K 5.0 12/11/2020   CO2 29 12/11/2020   GLUCOSE 95 12/11/2020   BUN 18 12/11/2020   CREATININE 0.89 12/11/2020   BILITOT 0.6 12/11/2020   ALKPHOS 83 12/11/2020   AST 20 12/11/2020   ALT 21 12/11/2020   PROT 6.4 (L) 12/11/2020   ALBUMIN 4.2 12/11/2020   CALCIUM 9.6 12/11/2020   ANIONGAP 6 12/11/2020   GFR 87.14 07/23/2020   Lab Results  Component Value Date   CHOL 145 07/23/2020   Lab Results  Component Value Date   HDL 51.20 07/23/2020   Lab Results  Component Value Date   LDLCALC  79 07/23/2020   Lab Results  Component Value Date   TRIG 75.0 07/23/2020   Lab Results  Component Value Date   CHOLHDL 3 07/23/2020   No results found for: HGBA1C    Assessment & Plan:   Problem List Items Addressed This Visit       Nervous and Auditory   Dysfunction of both eustachian tubes   Relevant Medications   fluticasone (FLONASE) 50 MCG/ACT nasal spray     Other   Anxiety   History of prostate cancer   Healthcare maintenance - Primary   Post-nasal drip   Relevant Medications   fluticasone (FLONASE) 50 MCG/ACT nasal spray    Meds ordered this encounter  Medications  . fluticasone (FLONASE) 50 MCG/ACT nasal spray    Sig: Place 2 sprays into both nostrils daily.    Dispense:  16 g    Refill:  6     Follow-up: Return in about 6 months (around 06/24/2021), or if symptoms worsen or fail to improve.  Encouraged him to continue on his weight loss journey.  Given information on postnasal drip and eustachian tube dysfunction.  Encouraged him to use the Flonase on a daily basis for it.  Follow-up if it does not improve.  Also information was given on health maintenance and disease prevention.  Recheck blood work in April.   Libby Maw, MD

## 2020-12-26 DIAGNOSIS — H43813 Vitreous degeneration, bilateral: Secondary | ICD-10-CM | POA: Diagnosis not present

## 2020-12-26 DIAGNOSIS — D3131 Benign neoplasm of right choroid: Secondary | ICD-10-CM | POA: Diagnosis not present

## 2020-12-26 DIAGNOSIS — H2513 Age-related nuclear cataract, bilateral: Secondary | ICD-10-CM | POA: Diagnosis not present

## 2020-12-26 DIAGNOSIS — H524 Presbyopia: Secondary | ICD-10-CM | POA: Diagnosis not present

## 2020-12-28 ENCOUNTER — Encounter: Payer: Self-pay | Admitting: Family Medicine

## 2021-01-07 ENCOUNTER — Ambulatory Visit: Payer: Medicare HMO | Admitting: Family Medicine

## 2021-01-13 NOTE — Progress Notes (Signed)
[place] Phone: [phone] Subjective:   Fontaine No, am serving as a scribe for Dr. Hulan Saas.  This visit occurred during the SARS-CoV-2 public health emergency.  Safety protocols were in place, including screening questions prior to the visit, additional usage of staff PPE, and extensive cleaning of exam room while observing appropriate contact time as indicated for disinfecting solutions.   I'm seeing this patient by the request  of:  Libby Maw, MD  CC: right shoulder and bilateral knee pain   WYO:[id number]  10/15/2020 Patient given repeat injection again.  Procedure well.  Discussed icing regimen and home exercises.  Discussed avoiding certain activities.  Patient feels that the every 3 months seems to be doing relatively well.  Can consider the possibility again of viscosupplementation if needed as well as the PRP.  Patient will continue to consider this.  Thinking about PRP in February.  Follow-up again in 6 to 12 weeks  Patient feels her shoulder is still doing relatively well.  Continues to have mild improvement he states very slowly.  Update 01/14/2021 [name] is a 70 y.o. male coming in with complaint of R shoulder and B knee pain. Injection in the knee 3 months ago. Patient states that his R shoulder pain has improved.   Injections in knees last visit helped after one week. Pain has started to come back in both knees.    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     Past Medical History:  Diagnosis Date   BENIGN PROSTATIC HYPERTROPHY 07/28/2006   GERD 07/28/2006   HYPERLIPIDEMIA 07/28/2006   NEOPLASM, MALIGNANT, PROSTATE 7/41/2878   NISSEN FUNDOPLICATION, HX OF 6/76/7209   Unspecified essential hypertension 03/27/2007   Past Surgical History:  Procedure Laterality Date   NISSEN FUNDOPLICATION  4709   PROSTATECTOMY  2009   TONSILLECTOMY     Social History    Socioeconomic History   Marital status: Married    Spouse name: Not on file   Number of children: Not on file   Years of education: Not on file   Highest education level: Not on file  Occupational History   Not on file  Tobacco Use   Smoking status: Former   Smokeless tobacco: Never   Tobacco comments:    smoked in college  Vaping Use   Vaping Use: Never used  Substance and Sexual Activity   Alcohol use: Yes    Alcohol/week: 0.0 standard drinks    Comment: rarely; once or twice yearly   Drug use: No   Sexual activity: Not on file  Other Topics Concern   Not on file  Social History Narrative   Not on file   Social Determinants of Health   Financial Resource Strain: Not on file  Food Insecurity: Not on file  Transportation Needs: Not on file  Physical Activity: Not on file  Stress: Not on file  Social Connections: Not on file   No Known Allergies Family History  Problem Relation Age of Onset   Heart disease Mother    Prostate cancer Father        prostate   Colon cancer Neg Hx      Current Outpatient Medications (Cardiovascular):    atorvastatin (LIPITOR) 40 MG tablet, Take 1 tablet (40 mg total) by mouth daily.   losartan-hydrochlorothiazide (HYZAAR) 100-25 MG tablet, Take 1 tablet by mouth daily.  Current Outpatient Medications (Respiratory):  fluticasone (FLONASE) 50 MCG/ACT nasal spray, Place 2 sprays into both nostrils daily.    Current Outpatient Medications (Other):    ALPRAZolam (XANAX) 0.5 MG tablet, TAKE 1 TABLET BY MOUTH TWICE A DAY AS NEEDED   cholecalciferol (VITAMIN D) 1000 units tablet, Take 2,000 Units by mouth daily.   Reviewed prior external information including notes and imaging from  primary care provider As well as notes that were available from care everywhere and other healthcare systems.  Past medical history, social, surgical and family history all reviewed in electronic medical record.  No pertanent information unless  stated regarding to the chief complaint.   Review of Systems:  No headache, visual changes, nausea, vomiting, diarrhea, constipation, dizziness, abdominal pain, skin rash, fevers, chills, night sweats, weight loss, swollen lymph nodes, body aches, joint swelling, chest pain, shortness of breath, mood changes. POSITIVE muscle aches  Objective  There were no vitals taken for this visit.   General: No apparent distress alert and oriented x3 mood and affect normal, dressed appropriately.  HEENT: Pupils equal, extraocular movements intact  Respiratory: Patient's speak in full sentences and does not appear short of breath  Cardiovascular: No lower extremity edema, non tender, no erythema  Gait very minorly antalgic MSK: Knee: Bilateral valgus deformity noted. Large thigh to calf ratio.  Tender to palpation over medial and PF joint line.  ROM full in flexion and extension and lower leg rotation. instability with valgus force.  painful patellar compression. Patellar glide with moderate crepitus. Patellar and quadriceps tendons unremarkable. Hamstring and quadriceps strength is normal.  After informed written and verbal consent, patient was seated on exam table. Right knee was prepped with alcohol swab and utilizing anterolateral approach, patient's right knee space was injected with 4:1  marcaine 0.5%: Kenalog 40mg /dL. Patient tolerated the procedure well without immediate complications.  After informed written and verbal consent, patient was seated on exam table. Left knee was prepped with alcohol swab and utilizing anterolateral approach, patient's left knee space was injected with 4:1  marcaine 0.5%: Kenalog 40mg /dL. Patient tolerated the procedure well without immediate complications.    Impression and Recommendations:     The above documentation has been reviewed and is accurate and complete Lyndal Pulley, DO

## 2021-01-14 ENCOUNTER — Encounter: Payer: Self-pay | Admitting: Family Medicine

## 2021-01-14 ENCOUNTER — Other Ambulatory Visit: Payer: Self-pay

## 2021-01-14 ENCOUNTER — Ambulatory Visit: Payer: Self-pay

## 2021-01-14 ENCOUNTER — Ambulatory Visit: Payer: Medicare HMO | Admitting: Family Medicine

## 2021-01-14 ENCOUNTER — Other Ambulatory Visit: Payer: Self-pay | Admitting: Family Medicine

## 2021-01-14 VITALS — BP 120/84 | HR 72 | Ht 73.0 in | Wt 232.0 lb

## 2021-01-14 DIAGNOSIS — M25511 Pain in right shoulder: Secondary | ICD-10-CM | POA: Diagnosis not present

## 2021-01-14 DIAGNOSIS — M170 Bilateral primary osteoarthritis of knee: Secondary | ICD-10-CM

## 2021-01-14 NOTE — Assessment & Plan Note (Signed)
Bilateral injections given today, tolerated the procedure well.  Chronic problem with exacerbation.  Patient is going to consider PRP in the long run as well.  Patient wants to avoid any surgical intervention.  We will continue with the conservative therapy otherwise and the injections when necessary.  Follow-up again in 3 months

## 2021-01-14 NOTE — Patient Instructions (Addendum)
Injected both knees today Hope this last for next 3 months If you decide to do PRP give [place] a message before hand

## 2021-01-28 ENCOUNTER — Ambulatory Visit (INDEPENDENT_AMBULATORY_CARE_PROVIDER_SITE_OTHER): Payer: Medicare HMO

## 2021-01-28 DIAGNOSIS — Z0000 Encounter for general adult medical examination without abnormal findings: Secondary | ICD-10-CM

## 2021-01-28 NOTE — Progress Notes (Signed)
Subjective:   Christopher [name]. is a 70 y.o. male who presents for an Subsequent Medicare Annual Wellness Visit.  I connected with Christopher [name] today by telephone and verified that I am speaking with the correct person using two identifiers. Location patient: home Location provider: work Persons participating in the virtual visit: patient, provider.   I discussed the limitations, risks, security and privacy concerns of performing an evaluation and management service by telephone and the availability of in person appointments. I also discussed with the patient that there may be a patient responsible charge related to this service. The patient expressed understanding and verbally consented to this telephonic visit.    Interactive audio and video telecommunications were attempted between this provider and patient, however failed, due to patient having technical difficulties OR patient did not have access to video capability.  We continued and completed visit with audio only.    Review of Systems     Cardiac Risk Factors include: advanced age (>57men, >37 women);dyslipidemia;male gender;hypertension     Objective:    Today's Vitals   There is no height or weight on file to calculate BMI.  Advanced Directives 01/28/2021 12/11/2020 12/27/2019 08/01/2015 08/22/2014  Does Patient Have a Medical Advance Directive? Yes Yes Yes Yes Yes  Type of Paramedic of [place];Living will [place];Living will [place];Living will [place];Living will [place];Living will  Does patient want to make changes to medical advance directive? - No - Patient declined - No - Patient declined -  Copy of [place] in Chart? No - copy requested No - copy requested No - copy requested No - copy requested -    Current Medications (verified) Outpatient Encounter Medications as of 01/28/2021   Medication Sig   ALPRAZolam (XANAX) 0.5 MG tablet TAKE 1 TABLET BY MOUTH TWICE A DAY AS NEEDED   atorvastatin (LIPITOR) 40 MG tablet Take 1 tablet (40 mg total) by mouth daily.   cholecalciferol (VITAMIN D) 1000 units tablet Take 2,000 Units by mouth daily.   fluticasone (FLONASE) 50 MCG/ACT nasal spray Place 2 sprays into both nostrils daily.   losartan-hydrochlorothiazide (HYZAAR) 100-25 MG tablet Take 1 tablet by mouth daily.   No facility-administered encounter medications on file as of 01/28/2021.    Allergies (verified) Patient has no known allergies.   History: Past Medical History:  Diagnosis Date   BENIGN PROSTATIC HYPERTROPHY 07/28/2006   GERD 07/28/2006   HYPERLIPIDEMIA 07/28/2006   NEOPLASM, MALIGNANT, PROSTATE 5/46/5035   NISSEN FUNDOPLICATION, HX OF 4/65/6812   Unspecified essential hypertension 03/27/2007   Past Surgical History:  Procedure Laterality Date   NISSEN FUNDOPLICATION  7517   PROSTATECTOMY  2009   TONSILLECTOMY     Family History  Problem Relation Age of Onset   Heart disease Mother    Prostate cancer Father        prostate   Colon cancer Neg Hx    Social History   Socioeconomic History   Marital status: Married    Spouse name: Not on file   Number of children: Not on file   Years of education: Not on file   Highest education level: Not on file  Occupational History   Not on file  Tobacco Use   Smoking status: Former   Smokeless tobacco: Never   Tobacco comments:    smoked in college  Vaping Use   Vaping Use: Never used  Substance and  Sexual Activity   Alcohol use: Yes    Alcohol/week: 0.0 standard drinks    Comment: rarely; once or twice yearly   Drug use: No   Sexual activity: Not on file  Other Topics Concern   Not on file  Social History Narrative   Not on file   Social Determinants of Health   Financial Resource Strain: Low Risk    Difficulty of Paying Living Expenses: Not hard at all  Food Insecurity: No Food  Insecurity   Worried About Charity fundraiser in the Last Year: Never true   [place] in the Last Year: Never true  Transportation Needs: No Transportation Needs   Lack of Transportation (Medical): No   Lack of Transportation (Non-Medical): No  Physical Activity: Sufficiently Active   Days of Exercise per Week: 6 days   Minutes of Exercise per Session: 50 min  Stress: No Stress Concern Present   Feeling of Stress : Not at all  Social Connections: Moderately Integrated   Frequency of Communication with Friends and Family: Twice a week   Frequency of Social Gatherings with Friends and Family: Twice a week   Attends Religious Services: 1 to 4 times per year   Active Member of [employer] or Organizations: No   Attends Music therapist: Never   Marital Status: Married    Tobacco Counseling Counseling given: Not Answered Tobacco comments: smoked in college   Clinical Intake:  Pre-visit preparation completed: Yes  Pain : No/denies pain     Nutritional Risks: None Diabetes: No  How often do you need to have someone help you when you read instructions, pamphlets, or other written materials from your doctor or pharmacy?: 1 - Never What is the last grade level you completed in school?: college  Diabetic?no   Interpreter Needed?: No  Information entered by :: [place] of Daily Living In your present state of health, do you have any difficulty performing the following activities: 01/28/2021  Hearing? N  Vision? N  Difficulty concentrating or making decisions? N  Walking or climbing stairs? N  Dressing or bathing? N  Preparing Food and eating ? N  Using the Toilet? N  In the past six months, have you accidently leaked urine? N  Do you have problems with loss of bowel control? N  Managing your Medications? N  Housekeeping or managing your Housekeeping? N  Some recent data might be hidden    Patient Care Team: Libby Maw, MD  as PCP - General (Family Medicine)  Indicate any recent Medical Services you may have received from other than [facility] providers in the past year (date may be approximate).     Assessment:   This is a routine wellness examination for Christopher [name].  Hearing/Vision screen Vision Screening - Comments:: Annual eye exams wears glasses   Dietary issues and exercise activities discussed: Current Exercise Habits: Home exercise routine, Type of exercise: walking, Time (Minutes): 50, Frequency (Times/Week): 5, Weekly Exercise (Minutes/Week): 250, Intensity: Moderate, Exercise limited by: None identified   Goals Addressed             This Visit's Progress    Patient Stated   On track    Eat healthier and work on getting shoulder feeling better       Depression Screen PHQ 2/9 Scores 01/28/2021 01/28/2021 12/24/2020 12/24/2020 10/15/2020 07/23/2020 12/27/2019  PHQ - 2 Score 0 0 0 0 0 0 0  PHQ- 9 Score - - 0 - - - -  Fall Risk Fall Risk  01/28/2021 12/24/2020 10/15/2020 07/23/2020 12/27/2019  Falls in the past year? 0 0 0 0 0  Number falls in past yr: 0 0 - - 0  Injury with Fall? 0 - - - 0  Risk for fall due to : No Fall Risks - - - -  Follow up Falls evaluation completed - - - Falls prevention discussed    FALL RISK PREVENTION PERTAINING TO THE HOME:  Any stairs in or around the home? No  If so, are there any without handrails? No  Home free of loose throw rugs in walkways, pet beds, electrical cords, etc? Yes  Adequate lighting in your home to reduce risk of falls? Yes   ASSISTIVE DEVICES UTILIZED TO PREVENT FALLS:  Life alert? No  Use of a cane, walker or w/c? No  Grab bars in the bathroom? Yes  Shower chair or bench in shower? Yes  Elevated toilet seat or a handicapped toilet? Yes    Cognitive Function:  Normal cognitive status assessed by direct observation by this Nurse Health Advisor. No abnormalities found.     6CIT Screen 12/27/2019  What Year? 0 points  What month? 0  points  What time? 0 points  Count back from 20 0 points  Months in reverse 0 points  Repeat phrase 0 points  Total Score 0    Immunizations Immunization History  Administered Date(s) Administered   Fluad Quad(high Dose 65+) 12/01/2018, 12/17/2019, 12/24/2020   Influenza, High Dose Seasonal PF 12/15/2017   Influenza,inj,Quad PF,6+ Mos 02/08/2013   Moderna Sars-Covid-2 Vaccination 04/02/2019, 04/30/2019   Pneumococcal Conjugate-13 12/23/2015   Tdap 02/09/2011, 08/30/2018   Zoster, Live 02/07/2012    TDAP status: Up to date  Flu Vaccine status: Up to date  Pneumococcal vaccine status: Up to date  Covid-19 vaccine status: Completed vaccines  Qualifies for Shingles Vaccine? Yes   Zostavax completed No   Shingrix Completed?: No.    Education has been provided regarding the importance of this vaccine. Patient has been advised to call insurance company to determine out of pocket expense if they have not yet received this vaccine. Advised may also receive vaccine at local pharmacy or Health Dept. Verbalized acceptance and understanding.  Screening Tests Health Maintenance  Topic Date Due   Hepatitis C Screening  Never done   Zoster Vaccines- Shingrix (1 of 2) Never done   Pneumonia Vaccine 49+ Years old (2 - PPSV23 if available, else PCV20) 12/22/2016   COLONOSCOPY (Pts 45-36yrs Insurance coverage will need to be confirmed)  08/17/2025   TETANUS/TDAP  08/29/2028   INFLUENZA VACCINE  Completed   HPV VACCINES  Aged Out   COVID-19 Vaccine  Discontinued    Health Maintenance  Health Maintenance Due  Topic Date Due   Hepatitis C Screening  Never done   Zoster Vaccines- Shingrix (1 of 2) Never done   Pneumonia Vaccine 23+ Years old (2 - PPSV23 if available, else PCV20) 12/22/2016    Colorectal cancer screening: Type of screening: Colonoscopy. Completed 08/18/2015. Repeat every 10 years  Lung Cancer Screening: (Low Dose CT Chest recommended if Age 33-80 years, 30 pack-year  currently smoking OR have quit w/in 15years.) does not qualify.   Lung Cancer Screening Referral: n/a  Additional Screening:  Hepatitis C Screening: does qualify;  Vision Screening: Recommended annual ophthalmology exams for early detection of glaucoma and other disorders of the eye. Is the patient up to date with their annual eye exam?  Yes  Who is  the provider or what is the name of the office in which the patient attends annual eye exams? [facility]  If pt is not established with a provider, would they like to be referred to a provider to establish care? No .   Dental Screening: Recommended annual dental exams for proper oral hygiene  Community Resource Referral / Chronic Care Management: CRR required this visit?  No   CCM required this visit?  No      Plan:     I have personally reviewed and noted the following in the patient's chart:   Medical and social history Use of alcohol, tobacco or illicit drugs  Current medications and supplements including opioid prescriptions. Patient is not currently taking opioid prescriptions. Functional ability and status Nutritional status Physical activity Advanced directives List of other physicians Hospitalizations, surgeries, and ER visits in previous 12 months Vitals Screenings to include cognitive, depression, and falls Referrals and appointments  In addition, I have reviewed and discussed with patient certain preventive protocols, quality metrics, and best practice recommendations. A written personalized care plan for preventive services as well as general preventive health recommendations were provided to patient.     Randel Pigg, LPN   [birth year]16   Nurse Notes: none

## 2021-01-28 NOTE — Patient Instructions (Signed)
[name] , Thank you for taking time to come for your Medicare Wellness Visit. I appreciate your ongoing commitment to your health goals. Please review the following plan we discussed and let me know if I can assist you in the future.   Screening recommendations/referrals: Colonoscopy: 08/18/2015 Recommended yearly ophthalmology/optometry visit for glaucoma screening and checkup Recommended yearly dental visit for hygiene and checkup  Vaccinations: Influenza vaccine: completed  Pneumococcal vaccine: completed  Tdap vaccine: 08/30/2018 Shingles vaccine: will consider     Advanced directives: will provide copies   Conditions/risks identified: none   Next appointment: none   Preventive Care 70 Years and Older, Male Preventive care refers to lifestyle choices and visits with your health care provider that can promote health and wellness. What does preventive care include? A yearly physical exam. This is also called an annual well check. Dental exams once or twice a year. Routine eye exams. Ask your health care provider how often you should have your eyes checked. Personal lifestyle choices, including: Daily care of your teeth and gums. Regular physical activity. Eating a healthy diet. Avoiding tobacco and drug use. Limiting alcohol use. Practicing safe sex. Taking low doses of aspirin every day. Taking vitamin and mineral supplements as recommended by your health care provider. What happens during an annual well check? The services and screenings done by your health care provider during your annual well check will depend on your age, overall health, lifestyle risk factors, and family history of disease. Counseling  Your health care provider may ask you questions about your: Alcohol use. Tobacco use. Drug use. Emotional well-being. Home and relationship well-being. Sexual activity. Eating habits. History of falls. Memory and ability to understand (cognition). Work and work  Statistician. Screening  You may have the following tests or measurements: Height, weight, and BMI. Blood pressure. Lipid and cholesterol levels. These may be checked every 5 years, or more frequently if you are over 53 years old. Skin check. Lung cancer screening. You may have this screening every year starting at age 79 if you have a 30-pack-year history of smoking and currently smoke or have quit within the past 15 years. Fecal occult blood test (FOBT) of the stool. You may have this test every year starting at age 57. Flexible sigmoidoscopy or colonoscopy. You may have a sigmoidoscopy every 5 years or a colonoscopy every 10 years starting at age 25. Prostate cancer screening. Recommendations will vary depending on your family history and other risks. Hepatitis C blood test. Hepatitis B blood test. Sexually transmitted disease (STD) testing. Diabetes screening. This is done by checking your blood sugar (glucose) after you have not eaten for a while (fasting). You may have this done every 1-3 years. Abdominal aortic aneurysm (AAA) screening. You may need this if you are a current or former smoker. Osteoporosis. You may be screened starting at age 25 if you are at high risk. Talk with your health care provider about your test results, treatment options, and if necessary, the need for more tests. Vaccines  Your health care provider may recommend certain vaccines, such as: Influenza vaccine. This is recommended every year. Tetanus, diphtheria, and acellular pertussis (Tdap, Td) vaccine. You may need a Td booster every 10 years. Zoster vaccine. You may need this after age 28. Pneumococcal 13-valent conjugate (PCV13) vaccine. One dose is recommended after age 72. Pneumococcal polysaccharide (PPSV23) vaccine. One dose is recommended after age 3. Talk to your health care provider about which screenings and vaccines you need and how  often you need them. This information is not intended to replace  advice given to you by your health care provider. Make sure you discuss any questions you have with your health care provider. Document Released: 03/21/2015 Document Revised: 11/12/2015 Document Reviewed: 12/24/2014 Elsevier Interactive Patient Education  2017 [place] Prevention in the Home Falls can cause injuries. They can happen to people of all ages. There are many things you can do to make your home safe and to help prevent falls. What can I do on the outside of my home? Regularly fix the edges of walkways and driveways and fix any cracks. Remove anything that might make you trip as you walk through a door, such as a raised step or threshold. Trim any bushes or trees on the path to your home. Use bright outdoor lighting. Clear any walking paths of anything that might make someone trip, such as rocks or tools. Regularly check to see if handrails are loose or broken. Make sure that both sides of any steps have handrails. Any raised decks and porches should have guardrails on the edges. Have any leaves, snow, or ice cleared regularly. Use sand or salt on walking paths during winter. Clean up any spills in your garage right away. This includes oil or grease spills. What can I do in the bathroom? Use night lights. Install grab bars by the toilet and in the tub and shower. Do not use towel bars as grab bars. Use non-skid mats or decals in the tub or shower. If you need to sit down in the shower, use a plastic, non-slip stool. Keep the floor dry. Clean up any water that spills on the floor as soon as it happens. Remove soap buildup in the tub or shower regularly. Attach bath mats securely with double-sided non-slip rug tape. Do not have throw rugs and other things on the floor that can make you trip. What can I do in the bedroom? Use night lights. Make sure that you have a light by your bed that is easy to reach. Do not use any sheets or blankets that are too big for your bed.  They should not hang down onto the floor. Have a firm chair that has side arms. You can use this for support while you get dressed. Do not have throw rugs and other things on the floor that can make you trip. What can I do in the kitchen? Clean up any spills right away. Avoid walking on wet floors. Keep items that you use a lot in easy-to-reach places. If you need to reach something above you, use a strong step stool that has a grab bar. Keep electrical cords out of the way. Do not use floor polish or wax that makes floors slippery. If you must use wax, use non-skid floor wax. Do not have throw rugs and other things on the floor that can make you trip. What can I do with my stairs? Do not leave any items on the stairs. Make sure that there are handrails on both sides of the stairs and use them. Fix handrails that are broken or loose. Make sure that handrails are as long as the stairways. Check any carpeting to make sure that it is firmly attached to the stairs. Fix any carpet that is loose or worn. Avoid having throw rugs at the top or bottom of the stairs. If you do have throw rugs, attach them to the floor with carpet tape. Make sure that you have a  light switch at the top of the stairs and the bottom of the stairs. If you do not have them, ask someone to add them for you. What else can I do to help prevent falls? Wear shoes that: Do not have high heels. Have rubber bottoms. Are comfortable and fit you well. Are closed at the toe. Do not wear sandals. If you use a stepladder: Make sure that it is fully opened. Do not climb a closed stepladder. Make sure that both sides of the stepladder are locked into place. Ask someone to hold it for you, if possible. Clearly mark and make sure that you can see: Any grab bars or handrails. First and last steps. Where the edge of each step is. Use tools that help you move around (mobility aids) if they are needed. These  include: Canes. Walkers. Scooters. Crutches. Turn on the lights when you go into a dark area. Replace any light bulbs as soon as they burn out. Set up your furniture so you have a clear path. Avoid moving your furniture around. If any of your floors are uneven, fix them. If there are any pets around you, be aware of where they are. Review your medicines with your doctor. Some medicines can make you feel dizzy. This can increase your chance of falling. Ask your doctor what other things that you can do to help prevent falls. This information is not intended to replace advice given to you by your health care provider. Make sure you discuss any questions you have with your health care provider. Document Released: 12/19/2008 Document Revised: 07/31/2015 Document Reviewed: 03/29/2014 Elsevier Interactive Patient Education  2017 [employer].

## 2021-02-19 ENCOUNTER — Other Ambulatory Visit: Payer: Self-pay | Admitting: Family Medicine

## 2021-02-19 NOTE — Telephone Encounter (Signed)
Refill request for: Alprazolam 0.5 mg LR 01/14/21, #60, 0 rf LOV 12/24/20 FOV  06/24/21  Please review and advise.  Thanks.  Dm/cma

## 2021-03-04 DIAGNOSIS — Z0100 Encounter for examination of eyes and vision without abnormal findings: Secondary | ICD-10-CM | POA: Diagnosis not present

## 2021-03-27 ENCOUNTER — Other Ambulatory Visit: Payer: Self-pay | Admitting: Family Medicine

## 2021-04-14 NOTE — Progress Notes (Signed)
[facility] [place] Phone: [phone] Subjective:   Christopher [name], am serving as a scribe for Dr. Hulan Saas.  This visit occurred during the SARS-CoV-2 public health emergency.  Safety protocols were in place, including screening questions prior to the visit, additional usage of staff PPE, and extensive cleaning of exam room while observing appropriate contact time as indicated for disinfecting solutions.  I'm seeing this patient by the request  of:  Libby Maw, MD  CC: Bilateral knee pain  HAL:[id number]  01/14/2021 Bilateral injections given today, tolerated the procedure well.  Chronic problem with exacerbation.  Patient is going to consider PRP in the long run as well.  Patient wants to avoid any surgical intervention.  We will continue with the conservative therapy otherwise and the injections when necessary.  Follow-up again in 3 months   Christopher [name]. is a 71 y.o. male coming in with complaint of B knee pain. Patient is here for PRP today. Last injections did help more than 1st round.      Past Medical History:  Diagnosis Date   BENIGN PROSTATIC HYPERTROPHY 07/28/2006   GERD 07/28/2006   HYPERLIPIDEMIA 07/28/2006   NEOPLASM, MALIGNANT, PROSTATE 3/53/2992   NISSEN FUNDOPLICATION, HX OF 07/02/8339   Unspecified essential hypertension 03/27/2007   Past Surgical History:  Procedure Laterality Date   NISSEN FUNDOPLICATION  9622   PROSTATECTOMY  2009   TONSILLECTOMY     Social History   Socioeconomic History   Marital status: Married    Spouse name: Not on file   Number of children: Not on file   Years of education: Not on file   Highest education level: Not on file  Occupational History   Not on file  Tobacco Use   Smoking status: Former   Smokeless tobacco: Never   Tobacco comments:    smoked in college  Vaping Use   Vaping Use: Never used  Substance and Sexual Activity   Alcohol  use: Yes    Alcohol/week: 0.0 standard drinks    Comment: rarely; once or twice yearly   Drug use: [name]   Sexual activity: Not on file  Other Topics Concern   Not on file  Social History Narrative   Not on file   Social Determinants of Health   Financial Resource Strain: Low Risk    Difficulty of Paying Living Expenses: Not hard at all  Food Insecurity: [name] Food Insecurity   Worried About Charity fundraiser in the Last Year: Never true   [place] in the Last Year: Never true  Transportation Needs: [name] Transportation Needs   Lack of Transportation (Medical): [name]   Lack of Transportation (Non-Medical): [name]  Physical Activity: Sufficiently Active   Days of Exercise per Week: 6 days   Minutes of Exercise per Session: 50 min  Stress: [name] Stress Concern Present   Feeling of Stress : Not at all  Social Connections: Moderately Integrated   Frequency of Communication with Friends and Family: Twice a week   Frequency of Social Gatherings with Friends and Family: Twice a week   Attends Religious Services: 1 to 4 times per year   Active Member of [employer] or Organizations: [name]   Attends Archivist Meetings: Never   Marital Status: Married   [name] Known Allergies Family History  Problem Relation Age of Onset   Heart disease Mother    Prostate cancer Father  prostate   Colon cancer Neg Hx      Current Outpatient Medications (Cardiovascular):    atorvastatin (LIPITOR) 40 MG tablet, Take 1 tablet (40 mg total) by mouth daily.   losartan-hydrochlorothiazide (HYZAAR) 100-25 MG tablet, Take 1 tablet by mouth daily.  Current Outpatient Medications (Respiratory):    fluticasone (FLONASE) 50 MCG/ACT nasal spray, Place 2 sprays into both nostrils daily.    Current Outpatient Medications (Other):    ALPRAZolam (XANAX) 0.5 MG tablet, TAKE 1 TABLET BY MOUTH TWICE A DAY AS NEEDED   cholecalciferol (VITAMIN D) 1000 units tablet, Take 2,000 Units by mouth daily.    Objective   Blood pressure (!) 136/92, pulse 78, height 6\' 1"  (1.854 m), weight 233 lb (105.7 kg), SpO2 99 %.   General: [name] apparent distress alert and oriented x3 mood and affect normal, dressed appropriately.  HEENT: Pupils equal, extraocular movements intact  Respiratory: Patient's speak in full sentences and does not appear short of breath  Cardiovascular: [name] lower extremity edema, non tender, [name] erythema  Gait antalgic After informed written and verbal consent, patient was seated on exam table. Right knee was prepped with alcohol swab and utilizing anterolateral approach, patient's right knee space was injected with 21-gauge 2 inch needle with 2 cc of 0.5% and injected with 3 cc of PRP leukocyte poor.. Patient tolerated the procedure well without immediate complications.  After informed written and verbal consent, patient was seated on exam table. Left knee was prepped with alcohol swab and utilizing anterolateral approach, patient's left knee space was injected with 21-gauge 2 inch needle with 2 cc of 0.5% Marcaine 3 cc of PRP leukocyte poor.  Patient tolerated the procedure well without immediate complications.   Impression and Recommendations:     The above documentation has been reviewed and is accurate and complete Lyndal Pulley, DO

## 2021-04-15 ENCOUNTER — Other Ambulatory Visit: Payer: Self-pay

## 2021-04-15 ENCOUNTER — Encounter: Payer: Self-pay | Admitting: Family Medicine

## 2021-04-15 ENCOUNTER — Ambulatory Visit (INDEPENDENT_AMBULATORY_CARE_PROVIDER_SITE_OTHER): Payer: Self-pay | Admitting: Family Medicine

## 2021-04-15 DIAGNOSIS — M170 Bilateral primary osteoarthritis of knee: Secondary | ICD-10-CM

## 2021-04-15 NOTE — Assessment & Plan Note (Addendum)
Patient given PRP today.  Tolerated the procedure well.  I anticipate patient doing relatively well overall.  Discussed icing regimen and exercise.  Follow-up again in 5 to 6 weeks.

## 2021-04-15 NOTE — Patient Instructions (Signed)
No ice or IBU for 3 days Heat and Tylenol ok See me again in 4-6 weeks

## 2021-04-17 ENCOUNTER — Ambulatory Visit: Payer: Medicare HMO | Admitting: Family Medicine

## 2021-04-17 ENCOUNTER — Other Ambulatory Visit: Payer: Self-pay | Admitting: Family Medicine

## 2021-04-17 DIAGNOSIS — I998 Other disorder of circulatory system: Secondary | ICD-10-CM

## 2021-04-30 ENCOUNTER — Other Ambulatory Visit: Payer: Self-pay | Admitting: Family Medicine

## 2021-05-15 NOTE — Progress Notes (Signed)
?Charlann Boxer D.O. ?[facility] ?[place] ?Phone: [phone] ?Subjective:   ?I, Christopher [name], am serving as a scribe for Dr. Hulan Saas. ? ?This visit occurred during the SARS-CoV-2 public health emergency.  Safety protocols were in place, including screening questions prior to the visit, additional usage of staff PPE, and extensive cleaning of exam room while observing appropriate contact time as indicated for disinfecting solutions.  ? ? ?I'm seeing this patient by the request  of:  Libby Maw, MD ? ?CC: bilateral knee pain  ? ?[id number]  ?04/15/2021 ?Patient given PRP today.  Tolerated the procedure well.  I anticipate patient doing relatively well overall.  Discussed icing regimen and exercise.  Follow-up again in 5 to 6 weeks. ? ?Update 05/18/2021 ?Christopher [name]. is a 71 y.o. male coming in with complaint of B knee pain. Patient states that pain has improving. Knees were tight 2 days later. Sit to stand still bothers patient.  ? ? ? ?  ? ?Past Medical History:  ?Diagnosis Date  ? BENIGN PROSTATIC HYPERTROPHY 07/28/2006  ? GERD 07/28/2006  ? HYPERLIPIDEMIA 07/28/2006  ? NEOPLASM, MALIGNANT, PROSTATE 08/01/2007  ? NISSEN FUNDOPLICATION, HX OF 5/95/6387  ? Unspecified essential hypertension 03/27/2007  ? ?Past Surgical History:  ?Procedure Laterality Date  ? NISSEN FUNDOPLICATION  5643  ? PROSTATECTOMY  2009  ? TONSILLECTOMY    ? ?Social History  ? ?Socioeconomic History  ? Marital status: Married  ?  Spouse name: Not on file  ? Number of children: Not on file  ? Years of education: Not on file  ? Highest education level: Not on file  ?Occupational History  ? Not on file  ?Tobacco Use  ? Smoking status: Former  ? Smokeless tobacco: Never  ? Tobacco comments:  ?  smoked in college  ?Vaping Use  ? Vaping Use: Never used  ?Substance and Sexual Activity  ? Alcohol use: Yes  ?  Alcohol/week: 0.0 standard drinks  ?  Comment: rarely; once or twice yearly  ? Drug  use: No  ? Sexual activity: Not on file  ?Other Topics Concern  ? Not on file  ?Social History Narrative  ? Not on file  ? ?Social Determinants of Health  ? ?Financial Resource Strain: Low Risk   ? Difficulty of Paying Living Expenses: Not hard at all  ?Food Insecurity: No Food Insecurity  ? Worried About Charity fundraiser in the Last Year: Never true  ? Ran Out of Food in the Last Year: Never true  ?Transportation Needs: No Transportation Needs  ? Lack of Transportation (Medical): No  ? Lack of Transportation (Non-Medical): No  ?Physical Activity: Sufficiently Active  ? Days of Exercise per Week: 6 days  ? Minutes of Exercise per Session: 50 min  ?Stress: No Stress Concern Present  ? Feeling of Stress : Not at all  ?Social Connections: Moderately Integrated  ? Frequency of Communication with Friends and Family: Twice a week  ? Frequency of Social Gatherings with Friends and Family: Twice a week  ? Attends Religious Services: 1 to 4 times per year  ? Active Member of Clubs or Organizations: No  ? Attends Archivist Meetings: Never  ? Marital Status: Married  ? ?No Known Allergies ?Family History  ?Problem Relation Age of Onset  ? Heart disease Mother   ? Prostate cancer Father   ?     prostate  ? Colon cancer Neg Hx   ? ? ? ?  Current Outpatient Medications (Cardiovascular):  ?  atorvastatin (LIPITOR) 40 MG tablet, Take 1 tablet (40 mg total) by mouth daily. ?  losartan-hydrochlorothiazide (HYZAAR) 100-25 MG tablet, TAKE 1 TABLET BY MOUTH EVERY DAY ? ?Current Outpatient Medications (Respiratory):  ?  fluticasone (FLONASE) 50 MCG/ACT nasal spray, Place 2 sprays into both nostrils daily. ? ? ? ?Current Outpatient Medications (Other):  ?  ALPRAZolam (XANAX) 0.5 MG tablet, TAKE 1 TABLET BY MOUTH TWICE A DAY AS NEEDED ?  cholecalciferol (VITAMIN D) 1000 units tablet, Take 2,000 Units by mouth daily. ? ? ? ?Objective  ?Blood pressure 112/80, pulse 63, height '6\' 1"'$  (1.854 m), weight 232 lb (105.2 kg), SpO2 99  %. ?  ?General: No apparent distress alert and oriented x3 mood and affect normal, dressed appropriately.  ?HEENT: Pupils equal, extraocular movements intact  ?Respiratory: Patient's speak in full sentences and does not appear short of breath  ?Cardiovascular: No lower extremity edema, non tender, no erythema  ?Antalgic  ?Mild trace effusion noted.  Improvement noted from previous exam. ?Instability noted  ?Great range of motion at this moment. ?  ?Impression and Recommendations:  ?  ?The above documentation has been reviewed and is accurate and complete Lyndal Pulley, DO ? ? ? ?

## 2021-05-18 ENCOUNTER — Encounter: Payer: Self-pay | Admitting: Family Medicine

## 2021-05-18 ENCOUNTER — Other Ambulatory Visit: Payer: Self-pay

## 2021-05-18 ENCOUNTER — Ambulatory Visit: Payer: Medicare HMO | Admitting: Family Medicine

## 2021-05-18 DIAGNOSIS — M170 Bilateral primary osteoarthritis of knee: Secondary | ICD-10-CM

## 2021-05-18 NOTE — Assessment & Plan Note (Signed)
Patient is doing much better after the PRP.  We discussed the potential for repeating.  Patient wants to see how he does with the conservative therapy.  Follow-up with me again in 3 months ?

## 2021-05-18 NOTE — Patient Instructions (Signed)
See me in 2 months ?Glad you are doing so well ?

## 2021-06-04 ENCOUNTER — Other Ambulatory Visit: Payer: Self-pay | Admitting: Family Medicine

## 2021-06-24 ENCOUNTER — Encounter: Payer: Self-pay | Admitting: Family Medicine

## 2021-06-24 ENCOUNTER — Ambulatory Visit (INDEPENDENT_AMBULATORY_CARE_PROVIDER_SITE_OTHER): Payer: Medicare HMO | Admitting: Family Medicine

## 2021-06-24 VITALS — BP 122/80 | HR 66 | Temp 97.1°F | Ht 73.0 in | Wt 238.2 lb

## 2021-06-24 DIAGNOSIS — Z8546 Personal history of malignant neoplasm of prostate: Secondary | ICD-10-CM

## 2021-06-24 DIAGNOSIS — I10 Essential (primary) hypertension: Secondary | ICD-10-CM | POA: Diagnosis not present

## 2021-06-24 DIAGNOSIS — R69 Illness, unspecified: Secondary | ICD-10-CM | POA: Diagnosis not present

## 2021-06-24 DIAGNOSIS — Z0000 Encounter for general adult medical examination without abnormal findings: Secondary | ICD-10-CM

## 2021-06-24 DIAGNOSIS — F419 Anxiety disorder, unspecified: Secondary | ICD-10-CM

## 2021-06-24 DIAGNOSIS — E559 Vitamin D deficiency, unspecified: Secondary | ICD-10-CM | POA: Diagnosis not present

## 2021-06-24 DIAGNOSIS — E785 Hyperlipidemia, unspecified: Secondary | ICD-10-CM | POA: Diagnosis not present

## 2021-06-24 DIAGNOSIS — Z23 Encounter for immunization: Secondary | ICD-10-CM | POA: Diagnosis not present

## 2021-06-24 DIAGNOSIS — D696 Thrombocytopenia, unspecified: Secondary | ICD-10-CM

## 2021-06-24 LAB — CBC WITH DIFFERENTIAL/PLATELET
Basophils Absolute: 0 10*3/uL (ref 0.0–0.1)
Basophils Relative: 0.2 % (ref 0.0–3.0)
Eosinophils Absolute: 0 10*3/uL (ref 0.0–0.7)
Eosinophils Relative: 0.3 % (ref 0.0–5.0)
HCT: 42.6 % (ref 39.0–52.0)
Hemoglobin: 14.2 g/dL (ref 13.0–17.0)
Lymphocytes Relative: 32.9 % (ref 12.0–46.0)
Lymphs Abs: 1.5 10*3/uL (ref 0.7–4.0)
MCHC: 33.3 g/dL (ref 30.0–36.0)
MCV: 87.5 fl (ref 78.0–100.0)
Monocytes Absolute: 0.5 10*3/uL (ref 0.1–1.0)
Monocytes Relative: 11.8 % (ref 3.0–12.0)
Neutro Abs: 2.5 10*3/uL (ref 1.4–7.7)
Neutrophils Relative %: 54.8 % (ref 43.0–77.0)
Platelets: 94 10*3/uL — ABNORMAL LOW (ref 150.0–400.0)
RBC: 4.87 Mil/uL (ref 4.22–5.81)
RDW: 14.7 % (ref 11.5–15.5)
WBC: 4.5 10*3/uL (ref 4.0–10.5)

## 2021-06-24 LAB — VITAMIN D 25 HYDROXY (VIT D DEFICIENCY, FRACTURES): VITD: 41.52 ng/mL (ref 30.00–100.00)

## 2021-06-24 LAB — BASIC METABOLIC PANEL
BUN: 14 mg/dL (ref 6–23)
CO2: 27 mEq/L (ref 19–32)
Calcium: 9.1 mg/dL (ref 8.4–10.5)
Chloride: 104 mEq/L (ref 96–112)
Creatinine, Ser: 0.88 mg/dL (ref 0.40–1.50)
GFR: 86.87 mL/min (ref >60.00)
Glucose, Bld: 94 mg/dL (ref 70–99)
Potassium: 4.3 mEq/L (ref 3.5–5.1)
Sodium: 138 mEq/L (ref 135–145)

## 2021-06-24 LAB — LIPID PANEL
Cholesterol: 131 mg/dL (ref 0–200)
HDL: 49.4 mg/dL (ref >39.00)
LDL Cholesterol: 63 mg/dL (ref 0–99)
NonHDL: 82.07
Total CHOL/HDL Ratio: 3
Triglycerides: 96 mg/dL (ref 0.0–149.0)
VLDL: 19.2 mg/dL (ref 0.0–40.0)

## 2021-06-24 LAB — URINALYSIS, ROUTINE W REFLEX MICROSCOPIC
Bilirubin Urine: NEGATIVE
Hgb urine dipstick: NEGATIVE
Ketones, ur: NEGATIVE
Leukocytes,Ua: NEGATIVE
Nitrite: NEGATIVE
RBC / HPF: NONE SEEN (ref 0–?)
Specific Gravity, Urine: 1.01 (ref 1.000–1.030)
Total Protein, Urine: NEGATIVE
Urine Glucose: NEGATIVE
Urobilinogen, UA: 0.2 (ref 0.0–1.0)
WBC, UA: NONE SEEN (ref 0–?)
pH: 7.5 (ref 5.0–8.0)

## 2021-06-24 LAB — PSA: PSA: 0 ng/mL — ABNORMAL LOW (ref 0.10–4.00)

## 2021-06-24 NOTE — Progress Notes (Addendum)
Established Patient Office Visit  Subjective   Patient ID: Christopher Ellender., male    DOB: 02/24/1951  Age: 71 y.o. MRN: [id number]  Chief Complaint  Patient presents with   Follow-up    6 month follow up, no concerns. Patient fasting.     HPI a physical exam and follow-up of hypertension, hyperlipidemia, vitamin D deficiency, anxiety.  Status post recent plasma enriched platelet injections in both knees.  Seem to have helped.  Back to exercising by walking.  Blood pressures been well controlled with Hyzaar.  Continues atorvastatin at moderate dose for cholesterol.  Continues with alprazolam for anxiety.  History of prostatectomy secondary to prostate cancer.    Review of Systems  Constitutional: Negative.   HENT: Negative.    Eyes:  Negative for blurred vision, discharge and redness.  Respiratory: Negative.    Cardiovascular: Negative.   Gastrointestinal:  Negative for abdominal pain.  Genitourinary: Negative.   Musculoskeletal:  Positive for joint pain. Negative for myalgias.  Skin:  Negative for rash.  Neurological:  Negative for tingling, loss of consciousness and weakness.  Endo/Heme/Allergies:  Negative for polydipsia.  Psychiatric/Behavioral:  The patient is nervous/anxious.       Objective:     BP 122/80 (BP Location: Right Arm, Patient Position: Sitting, Cuff Size: Large)   Pulse 66   Temp (!) 97.1 F (36.2 C) (Temporal)   Ht '6\' 1"'$  (1.854 m)   Wt 238 lb 3.2 oz (108 kg)   SpO2 97%   BMI 31.43 kg/m    Physical Exam Constitutional:      General: He is not in acute distress.    Appearance: Normal appearance. He is not ill-appearing, toxic-appearing or diaphoretic.  HENT:     Head: Normocephalic and atraumatic.     Right Ear: External ear normal.     Left Ear: External ear normal.     Mouth/Throat:     Mouth: Mucous membranes are moist.     Pharynx: Oropharynx is clear. No oropharyngeal exudate or posterior oropharyngeal erythema.  Eyes:     General: No  scleral icterus.       Right eye: No discharge.        Left eye: No discharge.     Extraocular Movements: Extraocular movements intact.     Conjunctiva/sclera: Conjunctivae normal.     Pupils: Pupils are equal, round, and reactive to light.  Cardiovascular:     Rate and Rhythm: Normal rate and regular rhythm.  Pulmonary:     Effort: Pulmonary effort is normal. No respiratory distress.     Breath sounds: Normal breath sounds.  Abdominal:     General: Bowel sounds are normal.  Musculoskeletal:     Cervical back: No rigidity or tenderness.  Skin:    General: Skin is warm and dry.  Neurological:     Mental Status: He is alert and oriented to person, place, and time.  Psychiatric:        Mood and Affect: Mood normal.        Behavior: Behavior normal.      Results for orders placed or performed in visit on 06/24/21  CBC with Differential/Platelet  Result Value Ref Range   WBC 4.5 4.0 - 10.5 K/uL   RBC 4.87 4.22 - 5.81 Mil/uL   Hemoglobin 14.2 13.0 - 17.0 g/dL   HCT 42.6 39.0 - 52.0 %   MCV 87.5 78.0 - 100.0 fl   MCHC 33.3 30.0 - 36.0 g/dL   RDW  14.7 11.5 - 15.5 %   Platelets 94.0 (L) 150.0 - 400.0 K/uL   Neutrophils Relative % 54.8 43.0 - 77.0 %   Lymphocytes Relative 32.9 12.0 - 46.0 %   Monocytes Relative 11.8 3.0 - 12.0 %   Eosinophils Relative 0.3 0.0 - 5.0 %   Basophils Relative 0.2 0.0 - 3.0 %   Neutro Abs 2.5 1.4 - 7.7 K/uL   Lymphs Abs 1.5 0.7 - 4.0 K/uL   Monocytes Absolute 0.5 0.1 - 1.0 K/uL   Eosinophils Absolute 0.0 0.0 - 0.7 K/uL   Basophils Absolute 0.0 0.0 - 0.1 K/uL  Basic metabolic panel  Result Value Ref Range   Sodium 138 135 - 145 mEq/L   Potassium 4.3 3.5 - 5.1 mEq/L   Chloride 104 96 - 112 mEq/L   CO2 27 19 - 32 mEq/L   Glucose, Bld 94 70 - 99 mg/dL   BUN 14 6 - 23 mg/dL   Creatinine, Ser 0.88 0.40 - 1.50 mg/dL   GFR 86.87 >60.00 mL/min   Calcium 9.1 8.4 - 10.5 mg/dL  VITAMIN D 25 Hydroxy (Vit-D Deficiency, Fractures)  Result Value Ref Range    VITD 41.52 30.00 - 100.00 ng/mL  Urinalysis, Routine w reflex microscopic  Result Value Ref Range   Color, Urine YELLOW Yellow;Lt. Yellow;Straw;Dark Yellow;Amber;Green;Red;Brown   APPearance CLEAR Clear;Turbid;Slightly Cloudy;Cloudy   Specific Gravity, Urine 1.010 1.000 - 1.030   pH 7.5 5.0 - 8.0   Total Protein, Urine NEGATIVE Negative   Urine Glucose NEGATIVE Negative   Ketones, ur NEGATIVE Negative   Bilirubin Urine NEGATIVE Negative   Hgb urine dipstick NEGATIVE Negative   Urobilinogen, UA 0.2 0.0 - 1.0   Leukocytes,Ua NEGATIVE Negative   Nitrite NEGATIVE Negative   WBC, UA none seen 0-2/hpf   RBC / HPF none seen 0-2/hpf  PSA  Result Value Ref Range   PSA 0.00 (L) 0.10 - 4.00 ng/mL  Lipid panel  Result Value Ref Range   Cholesterol 131 0 - 200 mg/dL   Triglycerides 96.0 0.0 - 149.0 mg/dL   HDL 49.40 >39.00 mg/dL   VLDL 19.2 0.0 - 40.0 mg/dL   LDL Cholesterol 63 0 - 99 mg/dL   Total CHOL/HDL Ratio 3    NonHDL 82.07       The 10-year ASCVD risk score (Arnett DK, et al., 2019) is: 17.1%    Assessment & Plan:   Problem List Items Addressed This Visit       Cardiovascular and Mediastinum   Essential hypertension   Relevant Orders   CBC with Differential/Platelet (Completed)   Urinalysis   Basic metabolic panel (Completed)   Urinalysis, Routine w reflex microscopic (Completed)     Hematopoietic and Hemostatic   Thrombocytopenia ([place])   Relevant Orders   Ambulatory referral to Hematology / Oncology     Other   Dyslipidemia   Relevant Orders   Lipid panel (Completed)   Anxiety   History of prostate cancer   Relevant Orders   PSA (Completed)   Healthcare maintenance   Vitamin D deficiency   Relevant Orders   VITAMIN D 25 Hydroxy (Vit-D Deficiency, Fractures) (Completed)   Need for pneumococcal 20-valent conjugate vaccination - Primary   Relevant Orders   Pneumococcal conjugate vaccine 20-valent (Prevnar 20) (Completed)    Return in about 6 months  (around 12/24/2021).  Continue all medicines as above.  Encouraged ongoing exercise with weight loss.  Maintenance and disease prevention.  Adjustments will be made results blood work  Prevnar 20 today.  Information was given on Shingrix.  Libby Maw, MD

## 2021-07-02 DIAGNOSIS — Z008 Encounter for other general examination: Secondary | ICD-10-CM | POA: Diagnosis not present

## 2021-07-02 DIAGNOSIS — Z720 Tobacco use: Secondary | ICD-10-CM | POA: Diagnosis not present

## 2021-07-02 DIAGNOSIS — Z6831 Body mass index (BMI) 31.0-31.9, adult: Secondary | ICD-10-CM | POA: Diagnosis not present

## 2021-07-02 DIAGNOSIS — M1990 Unspecified osteoarthritis, unspecified site: Secondary | ICD-10-CM | POA: Diagnosis not present

## 2021-07-02 DIAGNOSIS — E785 Hyperlipidemia, unspecified: Secondary | ICD-10-CM | POA: Diagnosis not present

## 2021-07-02 DIAGNOSIS — Z8673 Personal history of transient ischemic attack (TIA), and cerebral infarction without residual deficits: Secondary | ICD-10-CM | POA: Diagnosis not present

## 2021-07-02 DIAGNOSIS — E669 Obesity, unspecified: Secondary | ICD-10-CM | POA: Diagnosis not present

## 2021-07-02 DIAGNOSIS — I10 Essential (primary) hypertension: Secondary | ICD-10-CM | POA: Diagnosis not present

## 2021-07-02 DIAGNOSIS — R69 Illness, unspecified: Secondary | ICD-10-CM | POA: Diagnosis not present

## 2021-07-09 ENCOUNTER — Other Ambulatory Visit: Payer: Self-pay | Admitting: Family Medicine

## 2021-07-21 NOTE — Progress Notes (Deleted)
[place] Phone: [phone] Subjective:    I'm seeing this patient by the request  of:  Libby Maw, MD  CC:   [id number]  05/18/2021 Patient is doing much better after the PRP.  We discussed the potential for repeating.  Patient wants to see how he does with the conservative therapy.  Follow-up with me again in 3 months  Update 07/27/2021 Resean Brander. is a 71 y.o. male coming in with complaint of B knee pain. Patient states        Past Medical History:  Diagnosis Date   BENIGN PROSTATIC HYPERTROPHY 07/28/2006   GERD 07/28/2006   HYPERLIPIDEMIA 07/28/2006   NEOPLASM, MALIGNANT, PROSTATE 07/11/3974   NISSEN FUNDOPLICATION, HX OF 7/34/1937   Unspecified essential hypertension 03/27/2007   Past Surgical History:  Procedure Laterality Date   NISSEN FUNDOPLICATION  9024   PROSTATECTOMY  2009   TONSILLECTOMY     Social History   Socioeconomic History   Marital status: Married    Spouse name: Not on file   Number of children: Not on file   Years of education: Not on file   Highest education level: Not on file  Occupational History   Not on file  Tobacco Use   Smoking status: Former   Smokeless tobacco: Never   Tobacco comments:    smoked in college  Vaping Use   Vaping Use: Never used  Substance and Sexual Activity   Alcohol use: Yes    Alcohol/week: 0.0 standard drinks    Comment: rarely; once or twice yearly   Drug use: No   Sexual activity: Not on file  Other Topics Concern   Not on file  Social History Narrative   Not on file   Social Determinants of Health   Financial Resource Strain: Low Risk    Difficulty of Paying Living Expenses: Not hard at all  Food Insecurity: No Food Insecurity   Worried About Charity fundraiser in the Last Year: Never true   [place] in the Last Year: Never true  Transportation Needs: No Transportation Needs   Lack of  Transportation (Medical): No   Lack of Transportation (Non-Medical): No  Physical Activity: Sufficiently Active   Days of Exercise per Week: 6 days   Minutes of Exercise per Session: 50 min  Stress: No Stress Concern Present   Feeling of Stress : Not at all  Social Connections: Moderately Integrated   Frequency of Communication with Friends and Family: Twice a week   Frequency of Social Gatherings with Friends and Family: Twice a week   Attends Religious Services: 1 to 4 times per year   Active Member of [employer] or Organizations: No   Attends Music therapist: Never   Marital Status: Married   No Known Allergies Family History  Problem Relation Age of Onset   Heart disease Mother    Prostate cancer Father        prostate   Colon cancer Neg Hx      Current Outpatient Medications (Cardiovascular):    atorvastatin (LIPITOR) 40 MG tablet, Take 1 tablet (40 mg total) by mouth daily.   losartan-hydrochlorothiazide (HYZAAR) 100-25 MG tablet, TAKE 1 TABLET BY MOUTH EVERY DAY  Current Outpatient Medications (Respiratory):    fluticasone (FLONASE) 50 MCG/ACT nasal spray, Place 2 sprays into both nostrils daily.    Current Outpatient Medications (Other):    ALPRAZolam (XANAX) 0.5  MG tablet, TAKE 1 TABLET BY MOUTH TWICE A DAY AS NEEDED   cholecalciferol (VITAMIN D) 1000 units tablet, Take 2,000 Units by mouth daily.   Reviewed prior external information including notes and imaging from  primary care provider As well as notes that were available from care everywhere and other healthcare systems.  Past medical history, social, surgical and family history all reviewed in electronic medical record.  No pertanent information unless stated regarding to the chief complaint.   Review of Systems:  No headache, visual changes, nausea, vomiting, diarrhea, constipation, dizziness, abdominal pain, skin rash, fevers, chills, night sweats, weight loss, swollen lymph nodes, body aches,  joint swelling, chest pain, shortness of breath, mood changes. POSITIVE muscle aches  Objective  There were no vitals taken for this visit.   General: No apparent distress alert and oriented x3 mood and affect normal, dressed appropriately.  HEENT: Pupils equal, extraocular movements intact  Respiratory: Patient's speak in full sentences and does not appear short of breath  Cardiovascular: No lower extremity edema, non tender, no erythema  Gait normal with good balance and coordination.  MSK:  Non tender with full range of motion and good stability and symmetric strength and tone of shoulders, elbows, wrist, hip, knee and ankles bilaterally.     Impression and Recommendations:     The above documentation has been reviewed and is accurate and complete [name]

## 2021-07-27 ENCOUNTER — Ambulatory Visit: Payer: Medicare HMO | Admitting: Family Medicine

## 2021-08-13 ENCOUNTER — Telehealth: Payer: Self-pay | Admitting: *Deleted

## 2021-08-13 ENCOUNTER — Other Ambulatory Visit: Payer: Self-pay | Admitting: Family Medicine

## 2021-08-13 ENCOUNTER — Telehealth: Payer: Self-pay

## 2021-08-13 ENCOUNTER — Telehealth: Payer: Self-pay | Admitting: Family Medicine

## 2021-08-13 NOTE — Telephone Encounter (Signed)
Called and lvm for a callback to schedule a est 30 follow up with Dr. Marin Olp.

## 2021-08-13 NOTE — Addendum Note (Signed)
Addended by: Jon Billings on: 08/13/2021 11:58 AM   Modules accepted: Orders

## 2021-08-13 NOTE — Telephone Encounter (Signed)
Patient called and doesn't understand why he needs to be seen. I told him that he would have to call Dr. Bebe Shaggy office and he said that he would. It looks like that he doesn't want to seen as a follow up with Dr. Marin Olp.

## 2021-08-13 NOTE — Telephone Encounter (Signed)
Pt called and is concerned that his blood work was either incorrect or something was found. He was called by the referral team to make an appt with [facility]. Please call to clarify the findings.

## 2021-08-13 NOTE — Telephone Encounter (Signed)
Returned patient call, went over labs. Duplicate message.

## 2021-08-13 NOTE — Telephone Encounter (Signed)
Patient called would like to speak with Dr. Ethelene Hal little upset with time frame of lab results from last visit patient received a call from Hematology wanting to schedule appointment for evaluation of low platelets today patient states that he had labs over 1 month ago wanting to discuss this with Dr. Ethelene Hal. Please advise. I did apologized for the timing on patient receiving results and went over labs with patient, he said he would call hematology back to schedule appointment. But still would like to speak with Dr. Ethelene Hal.

## 2021-08-16 ENCOUNTER — Encounter: Payer: Self-pay | Admitting: Family Medicine

## 2021-09-07 ENCOUNTER — Other Ambulatory Visit: Payer: Self-pay

## 2021-09-07 DIAGNOSIS — D696 Thrombocytopenia, unspecified: Secondary | ICD-10-CM

## 2021-09-09 ENCOUNTER — Inpatient Hospital Stay: Payer: Medicare HMO | Attending: Hematology & Oncology

## 2021-09-09 ENCOUNTER — Inpatient Hospital Stay: Payer: Medicare HMO | Admitting: Hematology & Oncology

## 2021-09-09 ENCOUNTER — Encounter: Payer: Self-pay | Admitting: Hematology & Oncology

## 2021-09-09 VITALS — BP 146/81 | HR 68 | Temp 98.1°F | Resp 19 | Wt 235.0 lb

## 2021-09-09 DIAGNOSIS — D696 Thrombocytopenia, unspecified: Secondary | ICD-10-CM | POA: Insufficient documentation

## 2021-09-09 LAB — CMP (CANCER CENTER ONLY)
ALT: 20 U/L (ref 0–44)
AST: 20 U/L (ref 15–41)
Albumin: 4.6 g/dL (ref 3.5–5.0)
Alkaline Phosphatase: 94 U/L (ref 38–126)
Anion gap: 6 (ref 5–15)
BUN: 15 mg/dL (ref 8–23)
CO2: 28 mmol/L (ref 22–32)
Calcium: 9.7 mg/dL (ref 8.9–10.3)
Chloride: 105 mmol/L (ref 98–111)
Creatinine: 0.96 mg/dL (ref 0.61–1.24)
GFR, Estimated: >60 mL/min (ref >60)
Glucose, Bld: 127 mg/dL — ABNORMAL HIGH (ref 70–99)
Potassium: 4.4 mmol/L (ref 3.5–5.1)
Sodium: 139 mmol/L (ref 135–145)
Total Bilirubin: 0.6 mg/dL (ref 0.3–1.2)
Total Protein: 6.5 g/dL (ref 6.5–8.1)

## 2021-09-09 LAB — CBC WITH DIFFERENTIAL (CANCER CENTER ONLY)
Abs Immature Granulocytes: 0.54 10*3/uL — ABNORMAL HIGH (ref 0.00–0.07)
Basophils Absolute: 0 10*3/uL (ref 0.0–0.1)
Basophils Relative: 0 %
Eosinophils Absolute: 0 10*3/uL (ref 0.0–0.5)
Eosinophils Relative: 0 %
HCT: 43.6 % (ref 39.0–52.0)
Hemoglobin: 14.1 g/dL (ref 13.0–17.0)
Immature Granulocytes: 10 %
Lymphocytes Relative: 35 %
Lymphs Abs: 1.9 10*3/uL (ref 0.7–4.0)
MCH: 28.8 pg (ref 26.0–34.0)
MCHC: 32.3 g/dL (ref 30.0–36.0)
MCV: 89 fL (ref 80.0–100.0)
Monocytes Absolute: 0.6 10*3/uL (ref 0.1–1.0)
Monocytes Relative: 11 %
Neutro Abs: 2.3 10*3/uL (ref 1.7–7.7)
Neutrophils Relative %: 44 %
Platelet Count: 127 10*3/uL — ABNORMAL LOW (ref 150–400)
RBC: 4.9 MIL/uL (ref 4.22–5.81)
RDW: 13.7 % (ref 11.5–15.5)
WBC Count: 5.4 10*3/uL (ref 4.0–10.5)
nRBC: 0 % (ref 0.0–0.2)

## 2021-09-09 LAB — SAVE SMEAR(SSMR), FOR PROVIDER SLIDE REVIEW

## 2021-09-09 NOTE — Progress Notes (Signed)
Hematology and Oncology Follow Up Visit  Christopher [name] [id number] 12/07/1950 71 y.o. 09/09/2021   Principle Diagnosis:  Thrombocytopenia-chronic-mild  Current Therapy:   Observation     Interim History:  Christopher [name] is back for follow-up.  This is his second office visit.  We first saw him back in October of last year.  At that time, his platelet count was actually pretty normal.  His blood smear was unremarkable.  His platelet count does tend to fluctuate.  Back in April of this year, platelet count was 95,000.  He has had no problems with bleeding.  There is no bruising.  He has had no nausea or vomiting.  He has had no change in bowel or bladder habits.  He has had no problems with rashes.  He did have COVID last year.  I think he is gotten over this.  He has had no headache.  Currently, I would say his performance status is probably ECOG 0.  Medications:  Current Outpatient Medications:    ALPRAZolam (XANAX) 0.5 MG tablet, TAKE 1 TABLET BY MOUTH TWICE A DAY AS NEEDED, Disp: 60 tablet, Rfl: 0   atorvastatin (LIPITOR) 40 MG tablet, Take 1 tablet (40 mg total) by mouth daily., Disp: 90 tablet, Rfl: 4   cholecalciferol (VITAMIN D) 1000 units tablet, Take 5,000 Units by mouth daily., Disp: , Rfl:    fluticasone (FLONASE) 50 MCG/ACT nasal spray, Place 2 sprays into both nostrils daily., Disp: 16 g, Rfl: 6   losartan-hydrochlorothiazide (HYZAAR) 100-25 MG tablet, TAKE 1 TABLET BY MOUTH EVERY DAY, Disp: 90 tablet, Rfl: 3  Allergies: No Known Allergies  Past Medical History, Surgical history, Social history, and Family History were reviewed and updated.  Review of Systems: Review of Systems  Constitutional: Negative.   HENT:  Negative.    Eyes: Negative.   Respiratory: Negative.    Cardiovascular: Negative.   Gastrointestinal: Negative.   Endocrine: Negative.   Genitourinary: Negative.    Musculoskeletal: Negative.   Skin: Negative.   Neurological: Negative.    Hematological: Negative.   Psychiatric/Behavioral: Negative.      Physical Exam:  weight is 235 lb (106.6 kg). His oral temperature is 98.1 F (36.7 C). His blood pressure is 146/81 (abnormal) and his pulse is 68. His respiration is 19 and oxygen saturation is 100%.   Wt Readings from Last 3 Encounters:  09/09/21 235 lb (106.6 kg)  06/24/21 238 lb 3.2 oz (108 kg)  05/18/21 232 lb (105.2 kg)    Physical Exam Vitals reviewed.  HENT:     Head: Normocephalic and atraumatic.  Eyes:     Pupils: Pupils are equal, round, and reactive to light.  Cardiovascular:     Rate and Rhythm: Normal rate and regular rhythm.     Heart sounds: Normal heart sounds.  Pulmonary:     Effort: Pulmonary effort is normal.     Breath sounds: Normal breath sounds.  Abdominal:     General: Bowel sounds are normal.     Palpations: Abdomen is soft.  Musculoskeletal:        General: No tenderness or deformity. Normal range of motion.     Cervical back: Normal range of motion.  Lymphadenopathy:     Cervical: No cervical adenopathy.  Skin:    General: Skin is warm and dry.     Findings: No erythema or rash.  Neurological:     Mental Status: He is alert and oriented to person, place, and time.  Psychiatric:  Behavior: Behavior normal.        Thought Content: Thought content normal.        Judgment: Judgment normal.     Lab Results  Component Value Date   WBC 5.4 09/09/2021   HGB 14.1 09/09/2021   HCT 43.6 09/09/2021   MCV 89.0 09/09/2021   PLT 127 (L) 09/09/2021     Chemistry      Component Value Date/Time   NA 139 09/09/2021 0915   K 4.4 09/09/2021 0915   CL 105 09/09/2021 0915   CO2 28 09/09/2021 0915   BUN 15 09/09/2021 0915   CREATININE 0.96 09/09/2021 0915      Component Value Date/Time   CALCIUM 9.7 09/09/2021 0915   ALKPHOS 94 09/09/2021 0915   AST 20 09/09/2021 0915   ALT 20 09/09/2021 0915   BILITOT 0.6 09/09/2021 0915      Impression and Plan: Christopher [name] is a very  nice 71 year old white male.  He has mild thrombocytopenia.  His platelet count does tend to fluctuate.  I looked at his blood smear under the microscope.  Again, I do not see anything that looked suspicious for any underlying hematologic issue.  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  He does have a few large platelets.  For right now, I do not see need for a bone marrow biopsy.  I just do not think this is going to help out right now.  I think the yield would be quite low as to diagnosis.  I think we can just follow him along.  This plan to get him back after the Holidays.  We will get him back in January.   Volanda Napoleon, MD 7/5/202310:23 AM

## 2021-09-18 ENCOUNTER — Other Ambulatory Visit: Payer: Self-pay | Admitting: Family Medicine

## 2021-09-21 ENCOUNTER — Encounter: Payer: Self-pay | Admitting: Family Medicine

## 2021-09-24 ENCOUNTER — Ambulatory Visit: Payer: Medicare HMO | Admitting: Family Medicine

## 2021-10-16 ENCOUNTER — Other Ambulatory Visit: Payer: Self-pay | Admitting: Family Medicine

## 2021-10-16 DIAGNOSIS — E785 Hyperlipidemia, unspecified: Secondary | ICD-10-CM

## 2021-10-25 ENCOUNTER — Other Ambulatory Visit: Payer: Self-pay | Admitting: Family Medicine

## 2021-11-26 ENCOUNTER — Other Ambulatory Visit: Payer: Self-pay | Admitting: Family Medicine

## 2021-11-26 ENCOUNTER — Ambulatory Visit: Payer: Medicare HMO | Admitting: Family Medicine

## 2021-12-24 ENCOUNTER — Ambulatory Visit (INDEPENDENT_AMBULATORY_CARE_PROVIDER_SITE_OTHER): Payer: Medicare HMO | Admitting: Family Medicine

## 2021-12-24 ENCOUNTER — Encounter: Payer: Self-pay | Admitting: Family Medicine

## 2021-12-24 VITALS — BP 128/72 | HR 65 | Temp 97.2°F | Ht 73.0 in | Wt 236.4 lb

## 2021-12-24 DIAGNOSIS — Z23 Encounter for immunization: Secondary | ICD-10-CM | POA: Diagnosis not present

## 2021-12-24 DIAGNOSIS — E785 Hyperlipidemia, unspecified: Secondary | ICD-10-CM

## 2021-12-24 DIAGNOSIS — F419 Anxiety disorder, unspecified: Secondary | ICD-10-CM | POA: Diagnosis not present

## 2021-12-24 DIAGNOSIS — I10 Essential (primary) hypertension: Secondary | ICD-10-CM

## 2021-12-24 DIAGNOSIS — R69 Illness, unspecified: Secondary | ICD-10-CM | POA: Diagnosis not present

## 2021-12-24 LAB — BASIC METABOLIC PANEL
BUN: 18 mg/dL (ref 6–23)
CO2: 27 mEq/L (ref 19–32)
Calcium: 9.6 mg/dL (ref 8.4–10.5)
Chloride: 102 mEq/L (ref 96–112)
Creatinine, Ser: 0.91 mg/dL (ref 0.40–1.50)
GFR: 84.85 mL/min (ref >60.00)
Glucose, Bld: 94 mg/dL (ref 70–99)
Potassium: 4.3 mEq/L (ref 3.5–5.1)
Sodium: 135 mEq/L (ref 135–145)

## 2021-12-24 NOTE — Progress Notes (Signed)
Established Patient Office Visit  Subjective   Patient ID: Christopher [name]., male    DOB: 10-Dec-1950  Age: 71 y.o. MRN: [id number]  Chief Complaint  Patient presents with   Follow-up    6 month follow up, no concerns. Patient fasting.     HPI follow-up of hypertension, anxiety and dyslipidemia.  Blood pressure well controlled with Hyzaar 100/25.  Continues moderate dose of atorvastatin at 40 mg for mixed hyperlipidemia and elevated ASCVD risk score.  Continues exercising by walking.  Continue Xanax twice daily for anxiety.    Review of Systems  Constitutional: Negative.   HENT: Negative.    Eyes:  Negative for blurred vision, discharge and redness.  Respiratory: Negative.    Cardiovascular: Negative.   Gastrointestinal:  Negative for abdominal pain.  Genitourinary: Negative.   Musculoskeletal: Negative.  Negative for myalgias.  Skin:  Negative for rash.  Neurological:  Negative for tingling, loss of consciousness and weakness.  Endo/Heme/Allergies:  Negative for polydipsia.      Objective:     BP 128/72 (BP Location: Right Arm, Patient Position: Sitting, Cuff Size: Normal)   Pulse 65   Temp (!) 97.2 F (36.2 C) (Temporal)   Ht '6\' 1"'$  (1.854 m)   Wt 236 lb 6.4 oz (107.2 kg)   SpO2 98%   BMI 31.19 kg/m    Physical Exam Constitutional:      General: He is not in acute distress.    Appearance: Normal appearance. He is not ill-appearing, toxic-appearing or diaphoretic.  HENT:     Head: Normocephalic and atraumatic.     Right Ear: External ear normal.     Left Ear: External ear normal.     Mouth/Throat:     Mouth: Mucous membranes are moist.     Pharynx: Oropharynx is clear. No oropharyngeal exudate or posterior oropharyngeal erythema.  Eyes:     General: No scleral icterus.       Right eye: No discharge.        Left eye: No discharge.     Extraocular Movements: Extraocular movements intact.     Conjunctiva/sclera: Conjunctivae normal.     Pupils: Pupils are  equal, round, and reactive to light.  Cardiovascular:     Rate and Rhythm: Normal rate and regular rhythm.  Pulmonary:     Effort: Pulmonary effort is normal. No respiratory distress.     Breath sounds: Normal breath sounds.  Musculoskeletal:     Cervical back: No rigidity or tenderness.  Skin:    General: Skin is warm and dry.  Neurological:     Mental Status: He is alert and oriented to person, place, and time.  Psychiatric:        Mood and Affect: Mood normal.        Behavior: Behavior normal.      No results found for any visits on 12/24/21.    The 10-year ASCVD risk score (Arnett DK, et al., 2019) is: 18.5%    Assessment & Plan:   Problem List Items Addressed This Visit       Cardiovascular and Mediastinum   Essential hypertension   Relevant Orders   Basic metabolic panel     Other   Dyslipidemia   Anxiety   Need for influenza vaccination - Primary   Relevant Orders   Flu vaccine HIGH DOSE PF (Fluzone High dose) (Completed)    Return in about 6 months (around 06/25/2022).  Continue above medicines at current doses.  Encouraged him  to continue regular exercise by walking and lifting weights.  He is due for his second dose of Shingrix.  Advised the seasonal COVID-vaccine as well as the RSV vaccine.  Continue follow-up with sports medicine.   Libby Maw, MD

## 2022-01-01 ENCOUNTER — Other Ambulatory Visit: Payer: Self-pay | Admitting: Family Medicine

## 2022-01-22 ENCOUNTER — Encounter: Payer: Self-pay | Admitting: Family Medicine

## 2022-01-26 ENCOUNTER — Ambulatory Visit: Payer: Medicare HMO | Admitting: Family Medicine

## 2022-01-26 DIAGNOSIS — H43813 Vitreous degeneration, bilateral: Secondary | ICD-10-CM | POA: Diagnosis not present

## 2022-01-26 DIAGNOSIS — H2513 Age-related nuclear cataract, bilateral: Secondary | ICD-10-CM | POA: Diagnosis not present

## 2022-01-26 DIAGNOSIS — H524 Presbyopia: Secondary | ICD-10-CM | POA: Diagnosis not present

## 2022-01-26 DIAGNOSIS — H52203 Unspecified astigmatism, bilateral: Secondary | ICD-10-CM | POA: Diagnosis not present

## 2022-01-26 DIAGNOSIS — D3131 Benign neoplasm of right choroid: Secondary | ICD-10-CM | POA: Diagnosis not present

## 2022-01-27 ENCOUNTER — Ambulatory Visit: Payer: Medicare HMO | Admitting: Family Medicine

## 2022-02-05 ENCOUNTER — Other Ambulatory Visit: Payer: Self-pay | Admitting: Family Medicine

## 2022-03-11 ENCOUNTER — Other Ambulatory Visit: Payer: Self-pay | Admitting: Family Medicine

## 2022-03-19 ENCOUNTER — Telehealth: Payer: Self-pay | Admitting: Family Medicine

## 2022-03-19 NOTE — Telephone Encounter (Signed)
Left message for patient to call back and schedule Medicare Annual Wellness Visit (AWV) in office.   If not able to come in office, please offer to do virtually or by telephone.  Left office number and my jabber [phone].  Last AWV:01/28/2021   Please schedule at anytime with Nurse Health Advisor.

## 2022-03-22 NOTE — Progress Notes (Deleted)
[name] Phone: [phone] Subjective:    I'm seeing this patient by the request  of:  Libby Maw, MD  CC:   [id number]  05/18/2021 Patient is doing much better after the PRP.  We discussed the potential for repeating.  Patient wants to see how he does with the conservative therapy.  Follow-up with me again in 3 months   Update 03/25/2022 Christopher [name]. is a 72 y.o. male coming in with complaint of B knee and R shoulder pain. Patient states      Past Medical History:  Diagnosis Date   BENIGN PROSTATIC HYPERTROPHY 07/28/2006   GERD 07/28/2006   HYPERLIPIDEMIA 07/28/2006   NEOPLASM, MALIGNANT, PROSTATE 0000000   NISSEN FUNDOPLICATION, HX OF [id number]   Unspecified essential hypertension 03/27/2007   Past Surgical History:  Procedure Laterality Date   NISSEN FUNDOPLICATION  [id number]   PROSTATECTOMY  2009   TONSILLECTOMY     Social History   Socioeconomic History   Marital status: Married    Spouse name: Not on file   Number of children: Not on file   Years of education: Not on file   Highest education level: Not on file  Occupational History   Not on file  Tobacco Use   Smoking status: Former   Smokeless tobacco: Never   Tobacco comments:    smoked in college  Vaping Use   Vaping Use: Never used  Substance and Sexual Activity   Alcohol use: Yes    Alcohol/week: 0.0 standard drinks of alcohol    Comment: rarely; once or twice yearly   Drug use: No   Sexual activity: Not on file  Other Topics Concern   Not on file  Social History Narrative   Not on file   Social Determinants of Health   Financial Resource Strain: Low Risk  (01/28/2021)   Overall Financial Resource Strain (CARDIA)    Difficulty of Paying Living Expenses: Not hard at all  Food Insecurity: No Food Insecurity (01/28/2021)   Hunger Vital Sign    Worried About Running Out of Food in the Last Year: Never true     Ran Out of Food in the Last Year: Never true  Transportation Needs: No Transportation Needs (01/28/2021)   PRAPARE - Hydrologist (Medical): No    Lack of Transportation (Non-Medical): No  Physical Activity: Sufficiently Active (01/28/2021)   Exercise Vital Sign    Days of Exercise per Week: 6 days    Minutes of Exercise per Session: 50 min  Stress: No Stress Concern Present (01/28/2021)   [place]    Feeling of Stress : Not at all  Social Connections: Moderately Integrated (01/28/2021)   Social Connection and Isolation Panel [NHANES]    Frequency of Communication with Friends and Family: Twice a week    Frequency of Social Gatherings with Friends and Family: Twice a week    Attends Religious Services: 1 to 4 times per year    Active Member of [employer] or Organizations: No    Attends Archivist Meetings: Never    Marital Status: Married   No Known Allergies Family History  Problem Relation Age of Onset   Heart disease Mother    Prostate cancer Father        prostate   Colon cancer Neg Hx      Current Outpatient Medications (Cardiovascular):  atorvastatin (LIPITOR) 40 MG tablet, TAKE 1 TABLET BY MOUTH EVERY DAY   losartan-hydrochlorothiazide (HYZAAR) 100-25 MG tablet, TAKE 1 TABLET BY MOUTH EVERY DAY  Current Outpatient Medications (Respiratory):    fluticasone (FLONASE) 50 MCG/ACT nasal spray, Place 2 sprays into both nostrils daily.    Current Outpatient Medications (Other):    ALPRAZolam (XANAX) 0.5 MG tablet, TAKE 1 TABLET BY MOUTH TWICE A DAY AS NEEDED   cholecalciferol (VITAMIN D) 1000 units tablet, Take 5,000 Units by mouth daily.   Reviewed prior external information including notes and imaging from  primary care provider As well as notes that were available from care everywhere and other healthcare systems.  Past medical history, social, surgical and  family history all reviewed in electronic medical record.  No pertanent information unless stated regarding to the chief complaint.   Review of Systems:  No headache, visual changes, nausea, vomiting, diarrhea, constipation, dizziness, abdominal pain, skin rash, fevers, chills, night sweats, weight loss, swollen lymph nodes, body aches, joint swelling, chest pain, shortness of breath, mood changes. POSITIVE muscle aches  Objective  There were no vitals taken for this visit.   General: No apparent distress alert and oriented x3 mood and affect normal, dressed appropriately.  HEENT: Pupils equal, extraocular movements intact  Respiratory: Patient's speak in full sentences and does not appear short of breath  Cardiovascular: No lower extremity edema, non tender, no erythema      Impression and Recommendations:

## 2022-03-25 ENCOUNTER — Ambulatory Visit: Payer: Medicare HMO | Admitting: Family Medicine

## 2022-03-29 ENCOUNTER — Inpatient Hospital Stay: Payer: Medicare HMO | Attending: Hematology & Oncology

## 2022-03-29 ENCOUNTER — Other Ambulatory Visit: Payer: Self-pay

## 2022-03-29 ENCOUNTER — Inpatient Hospital Stay: Payer: Medicare HMO | Admitting: Hematology & Oncology

## 2022-03-29 ENCOUNTER — Encounter: Payer: Self-pay | Admitting: Hematology & Oncology

## 2022-03-29 VITALS — BP 138/87 | HR 62 | Temp 97.6°F | Resp 18 | Wt 233.0 lb

## 2022-03-29 DIAGNOSIS — D696 Thrombocytopenia, unspecified: Secondary | ICD-10-CM | POA: Diagnosis not present

## 2022-03-29 LAB — CBC WITH DIFFERENTIAL (CANCER CENTER ONLY)
Abs Immature Granulocytes: 0.1 10*3/uL — ABNORMAL HIGH (ref 0.00–0.07)
Basophils Absolute: 0 10*3/uL (ref 0.0–0.1)
Basophils Relative: 0 %
Eosinophils Absolute: 0.1 10*3/uL (ref 0.0–0.5)
Eosinophils Relative: 2 %
HCT: 42.5 % (ref 39.0–52.0)
Hemoglobin: 13.9 g/dL (ref 13.0–17.0)
Lymphocytes Relative: 28 %
Lymphs Abs: 1.5 10*3/uL (ref 0.7–4.0)
MCH: 28.8 pg (ref 26.0–34.0)
MCHC: 32.7 g/dL (ref 30.0–36.0)
MCV: 88 fL (ref 80.0–100.0)
Monocytes Absolute: 0.8 10*3/uL (ref 0.1–1.0)
Monocytes Relative: 14 %
Myelocytes: 1 %
Neutro Abs: 3 10*3/uL (ref 1.7–7.7)
Neutrophils Relative %: 55 %
Platelet Count: 139 10*3/uL — ABNORMAL LOW (ref 150–400)
RBC: 4.83 MIL/uL (ref 4.22–5.81)
RDW: 14.1 % (ref 11.5–15.5)
WBC Count: 5.5 10*3/uL (ref 4.0–10.5)
nRBC: 0 % (ref 0.0–0.2)

## 2022-03-29 LAB — CMP (CANCER CENTER ONLY)
ALT: 25 U/L (ref 0–44)
AST: 22 U/L (ref 15–41)
Albumin: 4.7 g/dL (ref 3.5–5.0)
Alkaline Phosphatase: 103 U/L (ref 38–126)
Anion gap: 6 (ref 5–15)
BUN: 19 mg/dL (ref 8–23)
CO2: 29 mmol/L (ref 22–32)
Calcium: 10 mg/dL (ref 8.9–10.3)
Chloride: 102 mmol/L (ref 98–111)
Creatinine: 0.87 mg/dL (ref 0.61–1.24)
GFR, Estimated: >60 mL/min (ref >60)
Glucose, Bld: 84 mg/dL (ref 70–99)
Potassium: 4.7 mmol/L (ref 3.5–5.1)
Sodium: 137 mmol/L (ref 135–145)
Total Bilirubin: 0.5 mg/dL (ref 0.3–1.2)
Total Protein: 6.8 g/dL (ref 6.5–8.1)

## 2022-03-29 LAB — SAVE SMEAR(SSMR), FOR PROVIDER SLIDE REVIEW

## 2022-03-29 NOTE — Progress Notes (Signed)
Hematology and Oncology Follow Up Visit  Carole Deere [id number] [birth year] 72 y.o. 03/29/2022   Principle Diagnosis:  Thrombocytopenia-chronic-mild  Current Therapy:   Observation     Interim History:  Mr. Mesta is back for follow-up.  The bad news is that his wife passed away in March 06, 2023.  She apparently had liver cancer that had metastasized.  Thankfully, he is at a retirement center that he has a lot of support with.    Otherwise, he seems to be doing pretty well.  His platelet count keeps coming up.  I am happy about that.  He has had no problems with bleeding or bruising.  He has had no issues with his appetite.  Obviously, when his wife passed away, he started not eat as much.  There is been no change in bowel or bladder habits.  There is been no leg swelling.  He does wear compression stockings.  He has been very diligent with avoiding COVID.  Overall, I would say that his performance status is probably ECOG 0.    Medications:  Current Outpatient Medications:    ALPRAZolam (XANAX) 0.5 MG tablet, TAKE 1 TABLET BY MOUTH TWICE A DAY AS NEEDED, Disp: 60 tablet, Rfl: 0   atorvastatin (LIPITOR) 40 MG tablet, TAKE 1 TABLET BY MOUTH EVERY DAY, Disp: 90 tablet, Rfl: 4   cholecalciferol (VITAMIN D) 1000 units tablet, Take 5,000 Units by mouth daily., Disp: , Rfl:    fluticasone (FLONASE) 50 MCG/ACT nasal spray, Place 2 sprays into both nostrils daily., Disp: 16 g, Rfl: 6   losartan-hydrochlorothiazide (HYZAAR) 100-25 MG tablet, TAKE 1 TABLET BY MOUTH EVERY DAY, Disp: 90 tablet, Rfl: 3  Allergies: No Known Allergies  Past Medical History, Surgical history, Social history, and Family History were reviewed and updated.  Review of Systems: Review of Systems  Constitutional: Negative.   HENT:  Negative.    Eyes: Negative.   Respiratory: Negative.    Cardiovascular: Negative.   Gastrointestinal: Negative.   Endocrine: Negative.   Genitourinary: Negative.    Musculoskeletal:  Negative.   Skin: Negative.   Neurological: Negative.   Hematological: Negative.   Psychiatric/Behavioral: Negative.      Physical Exam:  weight is 233 lb (105.7 kg). His oral temperature is 97.6 F (36.4 C). His blood pressure is 138/87 and his pulse is 62. His respiration is 18 and oxygen saturation is 99%.   Wt Readings from Last 3 Encounters:  03/29/22 233 lb (105.7 kg)  12/24/21 236 lb 6.4 oz (107.2 kg)  09/09/21 235 lb (106.6 kg)    Physical Exam Vitals reviewed.  HENT:     Head: Normocephalic and atraumatic.  Eyes:     Pupils: Pupils are equal, round, and reactive to light.  Cardiovascular:     Rate and Rhythm: Normal rate and regular rhythm.     Heart sounds: Normal heart sounds.  Pulmonary:     Effort: Pulmonary effort is normal.     Breath sounds: Normal breath sounds.  Abdominal:     General: Bowel sounds are normal.     Palpations: Abdomen is soft.  Musculoskeletal:        General: No tenderness or deformity. Normal range of motion.     Cervical back: Normal range of motion.  Lymphadenopathy:     Cervical: No cervical adenopathy.  Skin:    General: Skin is warm and dry.     Findings: No erythema or rash.  Neurological:     Mental Status: He  is alert and oriented to person, place, and time.  Psychiatric:        Behavior: Behavior normal.        Thought Content: Thought content normal.        Judgment: Judgment normal.     Lab Results  Component Value Date   WBC 5.5 03/29/2022   HGB 13.9 03/29/2022   HCT 42.5 03/29/2022   MCV 88.0 03/29/2022   PLT 139 (L) 03/29/2022     Chemistry      Component Value Date/Time   NA 137 03/29/2022 0938   K 4.7 03/29/2022 0938   CL 102 03/29/2022 0938   CO2 29 03/29/2022 0938   BUN 19 03/29/2022 0938   CREATININE 0.87 03/29/2022 0938      Component Value Date/Time   CALCIUM 10.0 03/29/2022 0938   ALKPHOS 103 03/29/2022 0938   AST 22 03/29/2022 0938   ALT 25 03/29/2022 0938   BILITOT 0.5 03/29/2022 0938       Impression and Plan: Mr. Antuna is a very nice 72 year old white male.  He has mild thrombocytopenia.  His platelet count does tend to fluctuate.  I looked at his blood smear under the microscope.  Again, I do not see anything that looked suspicious for any underlying hematologic issue.  It is possible that Mr. Abreu may have mild immune thrombocytopenia.  He does have a few large platelets.  I am happy that his platelet count is going up.  As such, I think that we can probably see him back in 1 year.  I think a very thing looks stable in 1 year, then maybe we can let him go from the clinic.  Again I feel bad about his poor wife passing on.  It sounds like she just had a very tough time even though she did not last long.    Volanda Napoleon, MD 1/22/202410:23 AM

## 2022-04-09 ENCOUNTER — Telehealth: Payer: Self-pay | Admitting: Family Medicine

## 2022-04-09 NOTE — Telephone Encounter (Signed)
error 

## 2022-04-12 ENCOUNTER — Other Ambulatory Visit: Payer: Self-pay | Admitting: Family Medicine

## 2022-04-12 DIAGNOSIS — I998 Other disorder of circulatory system: Secondary | ICD-10-CM

## 2022-04-14 ENCOUNTER — Other Ambulatory Visit: Payer: Self-pay | Admitting: Family Medicine

## 2022-04-14 DIAGNOSIS — I781 Nevus, non-neoplastic: Secondary | ICD-10-CM | POA: Diagnosis not present

## 2022-04-14 DIAGNOSIS — L723 Sebaceous cyst: Secondary | ICD-10-CM | POA: Diagnosis not present

## 2022-04-14 DIAGNOSIS — L821 Other seborrheic keratosis: Secondary | ICD-10-CM | POA: Diagnosis not present

## 2022-04-14 DIAGNOSIS — L0291 Cutaneous abscess, unspecified: Secondary | ICD-10-CM | POA: Diagnosis not present

## 2022-04-14 DIAGNOSIS — L814 Other melanin hyperpigmentation: Secondary | ICD-10-CM | POA: Diagnosis not present

## 2022-04-29 ENCOUNTER — Telehealth: Payer: Self-pay | Admitting: Family Medicine

## 2022-04-29 NOTE — Telephone Encounter (Signed)
[place]. to schedule their annual wellness visit. Appointment made for 05/24/22.  [facility] AWV direct phone # [phone]

## 2022-05-12 DIAGNOSIS — L814 Other melanin hyperpigmentation: Secondary | ICD-10-CM | POA: Diagnosis not present

## 2022-05-12 DIAGNOSIS — I781 Nevus, non-neoplastic: Secondary | ICD-10-CM | POA: Diagnosis not present

## 2022-05-12 DIAGNOSIS — L821 Other seborrheic keratosis: Secondary | ICD-10-CM | POA: Diagnosis not present

## 2022-05-12 DIAGNOSIS — L723 Sebaceous cyst: Secondary | ICD-10-CM | POA: Diagnosis not present

## 2022-05-12 DIAGNOSIS — L0291 Cutaneous abscess, unspecified: Secondary | ICD-10-CM | POA: Diagnosis not present

## 2022-05-15 ENCOUNTER — Other Ambulatory Visit: Payer: Self-pay | Admitting: Family Medicine

## 2022-05-24 ENCOUNTER — Ambulatory Visit (INDEPENDENT_AMBULATORY_CARE_PROVIDER_SITE_OTHER): Payer: Medicare HMO

## 2022-05-24 VITALS — Ht 73.0 in | Wt 232.0 lb

## 2022-05-24 DIAGNOSIS — Z0000 Encounter for general adult medical examination without abnormal findings: Secondary | ICD-10-CM | POA: Diagnosis not present

## 2022-05-24 NOTE — Progress Notes (Signed)
I connected with  Christopher [name]. on 05/24/22 by a audio enabled telemedicine application and verified that I am speaking with the correct person using two identifiers.  Patient Location: Home  Provider Location: Office/Clinic  I discussed the limitations of evaluation and management by telemedicine. The patient expressed understanding and agreed to proceed.  Subjective:   Christopher [name]. is a 72 y.o. male who presents for Medicare Annual/Subsequent preventive examination.  Review of Systems     Cardiac Risk Factors include: advanced age (>53men, >58 women);dyslipidemia;hypertension;male gender;obesity (BMI >30kg/m2)     Objective:    Today's Vitals   05/24/22 1100  Weight: 232 lb (105.2 kg)  Height: 6\' 1"  (1.854 m)   Body mass index is 30.61 kg/m.     05/24/2022   11:04 AM 03/29/2022   10:09 AM 09/09/2021    9:34 AM 01/28/2021    3:13 PM 12/11/2020   11:06 AM 12/27/2019    9:04 AM 08/01/2015   10:21 AM  Advanced Directives  Does Patient Have a Medical Advance Directive? Yes Yes Yes Yes Yes Yes Yes  Type of Paramedic of [place];Living will Living will;Healthcare Power of [place];Living will [place];Living will [place];Living will [place];Living will [place];Living will  Does patient want to make changes to medical advance directive?  No - Patient declined No - Patient declined  No - Patient declined  No - Patient declined  Copy of [place] in Chart? No - copy requested  No - copy requested No - copy requested No - copy requested No - copy requested No - copy requested    Current Medications (verified) Outpatient Encounter Medications as of 05/24/2022  Medication Sig   ALPRAZolam (XANAX) 0.5 MG tablet TAKE 1 TABLET BY MOUTH TWICE A DAY AS NEEDED   atorvastatin (LIPITOR) 40 MG tablet TAKE 1 TABLET BY MOUTH EVERY DAY    cholecalciferol (VITAMIN D) 1000 units tablet Take 5,000 Units by mouth daily.   diphenhydrAMINE HCl, Sleep, (ZZZQUIL) 50 MG/30ML LIQD Take by mouth. Has been taking about a teaspoon some nights   fluticasone (FLONASE) 50 MCG/ACT nasal spray Place 2 sprays into both nostrils daily.   losartan-hydrochlorothiazide (HYZAAR) 100-25 MG tablet TAKE 1 TABLET BY MOUTH EVERY DAY   No facility-administered encounter medications on file as of 05/24/2022.    Allergies (verified) Patient has no known allergies.   History: Past Medical History:  Diagnosis Date   BENIGN PROSTATIC HYPERTROPHY 07/28/2006   GERD 07/28/2006   HYPERLIPIDEMIA 07/28/2006   NEOPLASM, MALIGNANT, PROSTATE 0000000   NISSEN FUNDOPLICATION, HX OF [id number]   Unspecified essential hypertension 03/27/2007   Past Surgical History:  Procedure Laterality Date   NISSEN FUNDOPLICATION  [id number]   PROSTATECTOMY  2009   TONSILLECTOMY     Family History  Problem Relation Age of Onset   Heart disease Mother    Prostate cancer Father        prostate   Colon cancer Neg Hx    Social History   Socioeconomic History   Marital status: Widowed    Spouse name: Not on file   Number of children: Not on file   Years of education: Not on file   Highest education level: Not on file  Occupational History   Not on file  Tobacco Use   Smoking status: Former   Smokeless tobacco: Never   Tobacco comments:  smoked in college  Vaping Use   Vaping Use: Never used  Substance and Sexual Activity   Alcohol use: Yes    Alcohol/week: 0.0 standard drinks of alcohol    Comment: rarely; once or twice yearly   Drug use: No   Sexual activity: Not on file  Other Topics Concern   Not on file  Social History Narrative   Not on file   Social Determinants of Health   Financial Resource Strain: Low Risk  (05/24/2022)   Overall Financial Resource Strain (CARDIA)    Difficulty of Paying Living Expenses: Not hard at all  Food Insecurity: No Food  Insecurity (05/24/2022)   Hunger Vital Sign    Worried About Running Out of Food in the Last Year: Never true    [place] in the Last Year: Never true  Transportation Needs: No Transportation Needs (05/24/2022)   PRAPARE - Hydrologist (Medical): No    Lack of Transportation (Non-Medical): No  Physical Activity: Sufficiently Active (05/24/2022)   Exercise Vital Sign    Days of Exercise per Week: 5 days    Minutes of Exercise per Session: 50 min  Stress: Stress Concern Present (05/24/2022)   [place]    Feeling of Stress : To some extent  Social Connections: Moderately Integrated (01/28/2021)   Social Connection and Isolation Panel [NHANES]    Frequency of Communication with Friends and Family: Twice a week    Frequency of Social Gatherings with Friends and Family: Twice a week    Attends Religious Services: 1 to 4 times per year    Active Member of [employer] or Organizations: No    Attends Music therapist: Never    Marital Status: Married    Tobacco Counseling Counseling given: Not Answered Tobacco comments: smoked in college   Clinical Intake:  Pre-visit preparation completed: Yes  Pain : No/denies pain     Nutritional Status: BMI > 30  Obese Nutritional Risks: None Diabetes: No  How often do you need to have someone help you when you read instructions, pamphlets, or other written materials from your doctor or pharmacy?: 1 - Never  Diabetic? no  Interpreter Needed?: No  Information entered by :: NAllen LPN   Activities of Daily Living    05/24/2022   11:05 AM  In your present state of health, do you have any difficulty performing the following activities:  Hearing? 1  Comment slightly muffled  Vision? 0  Difficulty concentrating or making decisions? 0  Walking or climbing stairs? 0  Dressing or bathing? 0  Doing errands, shopping? 0  Preparing  Food and eating ? N  Using the Toilet? N  In the past six months, have you accidently leaked urine? Y  Do you have problems with loss of bowel control? N  Managing your Medications? N  Managing your Finances? N  Housekeeping or managing your Housekeeping? N    Patient Care Team: Libby Maw, MD as PCP - General (Family Medicine)  Indicate any recent Medical Services you may have received from other than [facility] providers in the past year (date may be approximate).     Assessment:   This is a routine wellness examination for Christopher [name].  Hearing/Vision screen Vision Screening - Comments:: Regular eye exams,  Opth  Dietary issues and exercise activities discussed: Current Exercise Habits: Home exercise routine, Type of exercise: walking, Time (Minutes): 45, Frequency (Times/Week): 5,  Weekly Exercise (Minutes/Week): 225   Goals Addressed             This Visit's Progress    Patient Stated       05/24/2022, going through wife's belongings       Depression Screen    05/24/2022   11:04 AM 12/24/2021    9:22 AM 06/24/2021    9:22 AM 01/28/2021    3:21 PM 01/28/2021    3:11 PM 12/24/2020   12:07 PM 12/24/2020    9:32 AM  PHQ 2/9 Scores  PHQ - 2 Score 0 0 0 0 0 0 0  PHQ- 9 Score      0     Fall Risk    05/24/2022   11:04 AM 12/24/2021    9:22 AM 06/24/2021    9:22 AM 01/28/2021    3:13 PM 12/24/2020    9:32 AM  [place] in the past year? 0 0 0 0 0  Number falls in past yr: 0 0 0 0 0  Injury with Fall? 0   0   Risk for fall due to : Medication side effect   No Fall Risks   Follow up Falls prevention discussed;Education provided;Falls evaluation completed   Falls evaluation completed     FALL RISK PREVENTION PERTAINING TO THE HOME:  Any stairs in or around the home? No  If so, are there any without handrails? N/a Home free of loose throw rugs in walkways, pet beds, electrical cords, etc? Yes  Adequate lighting in your home to reduce risk  of falls? Yes   ASSISTIVE DEVICES UTILIZED TO PREVENT FALLS:  Life alert? No  Use of a cane, walker or w/c? No  Grab bars in the bathroom? Yes  Shower chair or bench in shower? Yes  Elevated toilet seat or a handicapped toilet? Yes   TIMED UP AND GO:  Was the test performed? No .      Cognitive Function:        05/24/2022   11:07 AM 12/27/2019    9:13 AM  6CIT Screen  What Year? 0 points 0 points  What month? 0 points 0 points  What time? 0 points 0 points  Count back from 20 0 points 0 points  Months in reverse 0 points 0 points  Repeat phrase 2 points 0 points  Total Score 2 points 0 points    Immunizations Immunization History  Administered Date(s) Administered   Fluad Quad(high Dose 65+) 12/01/2018, 12/17/2019, 12/24/2020   Influenza, High Dose Seasonal PF 12/15/2017, 12/24/2021   Influenza,inj,Quad PF,6+ Mos 02/08/2013   Moderna Sars-Covid-2 Vaccination 04/02/2019, 04/30/2019   PNEUMOCOCCAL CONJUGATE-20 06/24/2021   Pneumococcal Conjugate-13 12/23/2015   Tdap 02/09/2011, 08/30/2018   Zoster Recombinat (Shingrix) 07/01/2021, 01/09/2022   Zoster, Live 02/07/2012    TDAP status: Up to date  Flu Vaccine status: Up to date  Pneumococcal vaccine status: Up to date  Covid-19 vaccine status: Completed vaccines  Qualifies for Shingles Vaccine? Yes   Zostavax completed Yes   Shingrix Completed?: Yes  Screening Tests Health Maintenance  Topic Date Due   Medicare Annual Wellness (AWV)  01/28/2022   COLONOSCOPY (Pts 45-64yrs Insurance coverage will need to be confirmed)  08/17/2025   DTaP/Tdap/Td (3 - Td or Tdap) 08/29/2028   Pneumonia Vaccine 53+ Years old  Completed   INFLUENZA VACCINE  Completed   Zoster Vaccines- Shingrix  Completed   HPV VACCINES  Aged Out   COVID-19 Vaccine  Discontinued  Hepatitis C Screening  Discontinued    Health Maintenance  Health Maintenance Due  Topic Date Due   Medicare Annual Wellness (AWV)  01/28/2022     Colorectal cancer screening: Type of screening: Colonoscopy. Completed 08/18/2015. Repeat every 10 years  Lung Cancer Screening: (Low Dose CT Chest recommended if Age 66-80 years, 30 pack-year currently smoking OR have quit w/in 15years.) does not qualify.   Lung Cancer Screening Referral: no  Additional Screening:  Hepatitis C Screening: does not qualify;  Vision Screening: Recommended annual ophthalmology exams for early detection of glaucoma and other disorders of the eye. Is the patient up to date with their annual eye exam?  Yes  Who is the provider or what is the name of the office in which the patient attends annual eye exams? [facility] If pt is not established with a provider, would they like to be referred to a provider to establish care? No .   Dental Screening: Recommended annual dental exams for proper oral hygiene  Community Resource Referral / Chronic Care Management: CRR required this visit?  No   CCM required this visit?  No      Plan:     I have personally reviewed and noted the following in the patient's chart:   Medical and social history Use of alcohol, tobacco or illicit drugs  Current medications and supplements including opioid prescriptions. Patient is not currently taking opioid prescriptions. Functional ability and status Nutritional status Physical activity Advanced directives List of other physicians Hospitalizations, surgeries, and ER visits in previous 12 months Vitals Screenings to include cognitive, depression, and falls Referrals and appointments  In addition, I have reviewed and discussed with patient certain preventive protocols, quality metrics, and best practice recommendations. A written personalized care plan for preventive services as well as general preventive health recommendations were provided to patient.     Kellie Simmering, LPN   [id number]   Nurse Notes: having trouble sleeping. Has to get up to go to the  bathroom, but tries to prolong it because he wants to sleep. Getting to the point that feels dull from having lack of sleep. Has tried over the counter remedies with little help.   Due to this being a virtual visit, the after visit summary with patients personalized plan was offered to patient via mail or my-chart.  Patient would like to access on my-chart

## 2022-05-24 NOTE — Patient Instructions (Signed)
[name] , Thank you for taking time to come for your Medicare Wellness Visit. I appreciate your ongoing commitment to your health goals. Please review the following plan we discussed and let me know if I can assist you in the future.   These are the goals we discussed:  Goals      Patient Stated     Eat healthier and work on getting shoulder feeling better     Patient Stated     05/24/2022, going through wife's belongings        This is a list of the screening recommended for you and due dates:  Health Maintenance  Topic Date Due   Medicare Annual Wellness Visit  05/24/2023   Colon Cancer Screening  08/17/2025   DTaP/Tdap/Td vaccine (3 - Td or Tdap) 08/29/2028   Pneumonia Vaccine  Completed   Flu Shot  Completed   Zoster (Shingles) Vaccine  Completed   HPV Vaccine  Aged Out   COVID-19 Vaccine  Discontinued   Hepatitis C Screening: USPSTF Recommendation to screen - Ages 56-79 yo.  Discontinued    Advanced directives: Please bring a copy of your POA (Power of Attorney) and/or Living Will to your next appointment.   Conditions/risks identified: trouble sleeping  Next appointment: Follow up in one year for your annual wellness visit. none  Preventive Care 47 Years and Older, Male  Preventive care refers to lifestyle choices and visits with your health care provider that can promote health and wellness. What does preventive care include? A yearly physical exam. This is also called an annual well check. Dental exams once or twice a year. Routine eye exams. Ask your health care provider how often you should have your eyes checked. Personal lifestyle choices, including: Daily care of your teeth and gums. Regular physical activity. Eating a healthy diet. Avoiding tobacco and drug use. Limiting alcohol use. Practicing safe sex. Taking low doses of aspirin every day. Taking vitamin and mineral supplements as recommended by your health care provider. What happens during an  annual well check? The services and screenings done by your health care provider during your annual well check will depend on your age, overall health, lifestyle risk factors, and family history of disease. Counseling  Your health care provider may ask you questions about your: Alcohol use. Tobacco use. Drug use. Emotional well-being. Home and relationship well-being. Sexual activity. Eating habits. History of falls. Memory and ability to understand (cognition). Work and work Statistician. Screening  You may have the following tests or measurements: Height, weight, and BMI. Blood pressure. Lipid and cholesterol levels. These may be checked every 5 years, or more frequently if you are over 69 years old. Skin check. Lung cancer screening. You may have this screening every year starting at age 74 if you have a 30-pack-year history of smoking and currently smoke or have quit within the past 15 years. Fecal occult blood test (FOBT) of the stool. You may have this test every year starting at age 45. Flexible sigmoidoscopy or colonoscopy. You may have a sigmoidoscopy every 5 years or a colonoscopy every 10 years starting at age 65. Prostate cancer screening. Recommendations will vary depending on your family history and other risks. Hepatitis C blood test. Hepatitis B blood test. Sexually transmitted disease (STD) testing. Diabetes screening. This is done by checking your blood sugar (glucose) after you have not eaten for a while (fasting). You may have this done every 1-3 years. Abdominal aortic aneurysm (AAA) screening. You may need  this if you are a current or former smoker. Osteoporosis. You may be screened starting at age 32 if you are at high risk. Talk with your health care provider about your test results, treatment options, and if necessary, the need for more tests. Vaccines  Your health care provider may recommend certain vaccines, such as: Influenza vaccine. This is recommended  every year. Tetanus, diphtheria, and acellular pertussis (Tdap, Td) vaccine. You may need a Td booster every 10 years. Zoster vaccine. You may need this after age 65. Pneumococcal 13-valent conjugate (PCV13) vaccine. One dose is recommended after age 28. Pneumococcal polysaccharide (PPSV23) vaccine. One dose is recommended after age 25. Talk to your health care provider about which screenings and vaccines you need and how often you need them. This information is not intended to replace advice given to you by your health care provider. Make sure you discuss any questions you have with your health care provider. Document Released: 03/21/2015 Document Revised: 11/12/2015 Document Reviewed: 12/24/2014 Elsevier Interactive Patient Education  2017 [place] Prevention in the Home Falls can cause injuries. They can happen to people of all ages. There are many things you can do to make your home safe and to help prevent falls. What can I do on the outside of my home? Regularly fix the edges of walkways and driveways and fix any cracks. Remove anything that might make you trip as you walk through a door, such as a raised step or threshold. Trim any bushes or trees on the path to your home. Use bright outdoor lighting. Clear any walking paths of anything that might make someone trip, such as rocks or tools. Regularly check to see if handrails are loose or broken. Make sure that both sides of any steps have handrails. Any raised decks and porches should have guardrails on the edges. Have any leaves, snow, or ice cleared regularly. Use sand or salt on walking paths during winter. Clean up any spills in your garage right away. This includes oil or grease spills. What can I do in the bathroom? Use night lights. Install grab bars by the toilet and in the tub and shower. Do not use towel bars as grab bars. Use non-skid mats or decals in the tub or shower. If you need to sit down in the shower,  use a plastic, non-slip stool. Keep the floor dry. Clean up any water that spills on the floor as soon as it happens. Remove soap buildup in the tub or shower regularly. Attach bath mats securely with double-sided non-slip rug tape. Do not have throw rugs and other things on the floor that can make you trip. What can I do in the bedroom? Use night lights. Make sure that you have a light by your bed that is easy to reach. Do not use any sheets or blankets that are too big for your bed. They should not hang down onto the floor. Have a firm chair that has side arms. You can use this for support while you get dressed. Do not have throw rugs and other things on the floor that can make you trip. What can I do in the kitchen? Clean up any spills right away. Avoid walking on wet floors. Keep items that you use a lot in easy-to-reach places. If you need to reach something above you, use a strong step stool that has a grab bar. Keep electrical cords out of the way. Do not use floor polish or wax that makes floors  slippery. If you must use wax, use non-skid floor wax. Do not have throw rugs and other things on the floor that can make you trip. What can I do with my stairs? Do not leave any items on the stairs. Make sure that there are handrails on both sides of the stairs and use them. Fix handrails that are broken or loose. Make sure that handrails are as long as the stairways. Check any carpeting to make sure that it is firmly attached to the stairs. Fix any carpet that is loose or worn. Avoid having throw rugs at the top or bottom of the stairs. If you do have throw rugs, attach them to the floor with carpet tape. Make sure that you have a light switch at the top of the stairs and the bottom of the stairs. If you do not have them, ask someone to add them for you. What else can I do to help prevent falls? Wear shoes that: Do not have high heels. Have rubber bottoms. Are comfortable and fit you  well. Are closed at the toe. Do not wear sandals. If you use a stepladder: Make sure that it is fully opened. Do not climb a closed stepladder. Make sure that both sides of the stepladder are locked into place. Ask someone to hold it for you, if possible. Clearly mark and make sure that you can see: Any grab bars or handrails. First and last steps. Where the edge of each step is. Use tools that help you move around (mobility aids) if they are needed. These include: Canes. Walkers. Scooters. Crutches. Turn on the lights when you go into a dark area. Replace any light bulbs as soon as they burn out. Set up your furniture so you have a clear path. Avoid moving your furniture around. If any of your floors are uneven, fix them. If there are any pets around you, be aware of where they are. Review your medicines with your doctor. Some medicines can make you feel dizzy. This can increase your chance of falling. Ask your doctor what other things that you can do to help prevent falls. This information is not intended to replace advice given to you by your health care provider. Make sure you discuss any questions you have with your health care provider. Document Released: 12/19/2008 Document Revised: 07/31/2015 Document Reviewed: 03/29/2014 Elsevier Interactive Patient Education  2017 [employer].

## 2022-05-25 ENCOUNTER — Ambulatory Visit (INDEPENDENT_AMBULATORY_CARE_PROVIDER_SITE_OTHER): Payer: Medicare HMO | Admitting: Family Medicine

## 2022-05-25 ENCOUNTER — Encounter: Payer: Self-pay | Admitting: Family Medicine

## 2022-05-25 VITALS — BP 130/78 | HR 70 | Temp 97.6°F | Ht 73.0 in | Wt 235.6 lb

## 2022-05-25 DIAGNOSIS — R69 Illness, unspecified: Secondary | ICD-10-CM | POA: Diagnosis not present

## 2022-05-25 DIAGNOSIS — F4321 Adjustment disorder with depressed mood: Secondary | ICD-10-CM | POA: Diagnosis not present

## 2022-05-25 DIAGNOSIS — G4709 Other insomnia: Secondary | ICD-10-CM

## 2022-05-25 MED ORDER — MIRTAZAPINE 7.5 MG PO TABS: 7.5000 mg | ORAL_TABLET | Freq: Every day | ORAL | 1 refills | Status: DC

## 2022-05-25 NOTE — Progress Notes (Signed)
Established Patient Office Visit   Subjective:  Patient ID: Christopher [name]., male    DOB: February 22, 1951  Age: 72 y.o. MRN: [id number]  Chief Complaint  Patient presents with   Insomnia    Trouble sleeping. Has to get up to go to the bathroom, but tries to prolong it because he wants to sleep. Getting to the point that feels dull from having lack of sleep, over the counter remedies little help.       Insomnia PMH includes: no depression.    Encounter Diagnoses  Name Primary?   Other insomnia Yes   Grieving    Wife passed back in December diagnosed abdominal cancer.  He is grieving but also relieved that she is no longer in the pain that she had been experiencing.   Review of Systems  Constitutional: Negative.   HENT: Negative.    Eyes:  Negative for blurred vision, discharge and redness.  Respiratory: Negative.    Cardiovascular: Negative.   Gastrointestinal:  Negative for abdominal pain.  Genitourinary: Negative.   Musculoskeletal: Negative.  Negative for myalgias.  Skin:  Negative for rash.  Neurological:  Negative for tingling, loss of consciousness and weakness.  Endo/Heme/Allergies:  Negative for polydipsia.  Psychiatric/Behavioral:  Negative for depression. The patient is nervous/anxious and has insomnia.      Current Outpatient Medications:    ALPRAZolam (XANAX) 0.5 MG tablet, TAKE 1 TABLET BY MOUTH TWICE A DAY AS NEEDED, Disp: 60 tablet, Rfl: 0   atorvastatin (LIPITOR) 40 MG tablet, TAKE 1 TABLET BY MOUTH EVERY DAY, Disp: 90 tablet, Rfl: 4   cholecalciferol (VITAMIN D) 1000 units tablet, Take 5,000 Units by mouth daily., Disp: , Rfl:    diphenhydrAMINE HCl, Sleep, (ZZZQUIL) 50 MG/30ML LIQD, Take by mouth. Has been taking about a teaspoon some nights, Disp: , Rfl:    fluticasone (FLONASE) 50 MCG/ACT nasal spray, Place 2 sprays into both nostrils daily., Disp: 16 g, Rfl: 6   losartan-hydrochlorothiazide (HYZAAR) 100-25 MG tablet, TAKE 1 TABLET BY MOUTH EVERY DAY,  Disp: 90 tablet, Rfl: 3   mirtazapine (REMERON) 7.5 MG tablet, Take 1 tablet (7.5 mg total) by mouth at bedtime., Disp: 30 tablet, Rfl: 1   Objective:     BP 130/78 (BP Location: Right Arm, Patient Position: Sitting, Cuff Size: Large)   Pulse 70   Temp 97.6 F (36.4 C) (Temporal)   Ht 6\' 1"  (1.854 m)   Wt 235 lb 9.6 oz (106.9 kg)   SpO2 98%   BMI 31.08 kg/m    Physical Exam Constitutional:      General: He is not in acute distress.    Appearance: Normal appearance. He is not ill-appearing, toxic-appearing or diaphoretic.  HENT:     Head: Normocephalic and atraumatic.     Right Ear: External ear normal.     Left Ear: External ear normal.  Eyes:     General: No scleral icterus.       Right eye: No discharge.        Left eye: No discharge.     Extraocular Movements: Extraocular movements intact.     Conjunctiva/sclera: Conjunctivae normal.  Pulmonary:     Effort: Pulmonary effort is normal. No respiratory distress.  Skin:    General: Skin is warm and dry.  Neurological:     Mental Status: He is alert and oriented to person, place, and time.  Psychiatric:        Mood and Affect: Mood normal.  Behavior: Behavior normal.      No results found for any visits on 05/25/22.    The 10-year ASCVD risk score (Arnett DK, et al., 2019) is: 19%    Assessment & Plan:   Other insomnia -     Mirtazapine; Take 1 tablet (7.5 mg total) by mouth at bedtime.  Dispense: 30 tablet; Refill: 1  Grieving    Return Has follow-up scheduled for yearly physical next month..  Advised patient to expect daytime drowsiness for the first 3 or 4 days after taking medication.  That should wear off.  May continue with twice daily Xanax.  Information was given on insomnia and sleep hygiene.  Libby Maw, MD

## 2022-05-27 ENCOUNTER — Ambulatory Visit: Payer: Medicare HMO | Admitting: Family Medicine

## 2022-06-18 ENCOUNTER — Other Ambulatory Visit: Payer: Self-pay | Admitting: Family Medicine

## 2022-06-18 DIAGNOSIS — G4709 Other insomnia: Secondary | ICD-10-CM

## 2022-06-18 NOTE — Telephone Encounter (Signed)
Last refill: 05/17/22 #60, 0 Last OV: 05/25/22 dx. Insomnia, grieving  Next OV: 06/29/22

## 2022-06-25 ENCOUNTER — Ambulatory Visit: Payer: Medicare HMO | Admitting: Family Medicine

## 2022-06-29 ENCOUNTER — Encounter: Payer: Self-pay | Admitting: Family Medicine

## 2022-06-29 ENCOUNTER — Ambulatory Visit (INDEPENDENT_AMBULATORY_CARE_PROVIDER_SITE_OTHER): Payer: Medicare HMO | Admitting: Family Medicine

## 2022-06-29 VITALS — BP 124/82 | HR 65 | Temp 97.8°F | Ht 73.0 in | Wt 237.6 lb

## 2022-06-29 DIAGNOSIS — I10 Essential (primary) hypertension: Secondary | ICD-10-CM | POA: Diagnosis not present

## 2022-06-29 DIAGNOSIS — Z0000 Encounter for general adult medical examination without abnormal findings: Secondary | ICD-10-CM | POA: Diagnosis not present

## 2022-06-29 DIAGNOSIS — H9113 Presbycusis, bilateral: Secondary | ICD-10-CM

## 2022-06-29 DIAGNOSIS — G4700 Insomnia, unspecified: Secondary | ICD-10-CM | POA: Diagnosis not present

## 2022-06-29 LAB — URINALYSIS, ROUTINE W REFLEX MICROSCOPIC
Bilirubin Urine: NEGATIVE
Hgb urine dipstick: NEGATIVE
Ketones, ur: NEGATIVE
Leukocytes,Ua: NEGATIVE
Nitrite: NEGATIVE
RBC / HPF: NONE SEEN (ref 0–?)
Specific Gravity, Urine: 1.025 (ref 1.000–1.030)
Total Protein, Urine: NEGATIVE
Urine Glucose: NEGATIVE
Urobilinogen, UA: 0.2 (ref 0.0–1.0)
pH: 6 (ref 5.0–8.0)

## 2022-06-29 LAB — LIPID PANEL
Cholesterol: 125 mg/dL (ref 0–200)
HDL: 40.8 mg/dL (ref >39.00)
LDL Cholesterol: 65 mg/dL (ref 0–99)
NonHDL: 83.91
Total CHOL/HDL Ratio: 3
Triglycerides: 95 mg/dL (ref 0.0–149.0)
VLDL: 19 mg/dL (ref 0.0–40.0)

## 2022-06-29 LAB — COMPREHENSIVE METABOLIC PANEL
ALT: 21 U/L (ref 0–53)
AST: 22 U/L (ref 0–37)
Albumin: 4.3 g/dL (ref 3.5–5.2)
Alkaline Phosphatase: 93 U/L (ref 39–117)
BUN: 18 mg/dL (ref 6–23)
CO2: 28 mEq/L (ref 19–32)
Calcium: 9.2 mg/dL (ref 8.4–10.5)
Chloride: 104 mEq/L (ref 96–112)
Creatinine, Ser: 0.9 mg/dL (ref 0.40–1.50)
GFR: 85.67 mL/min (ref >60.00)
Glucose, Bld: 88 mg/dL (ref 70–99)
Potassium: 4.5 mEq/L (ref 3.5–5.1)
Sodium: 138 mEq/L (ref 135–145)
Total Bilirubin: 0.6 mg/dL (ref 0.2–1.2)
Total Protein: 6.2 g/dL (ref 6.0–8.3)

## 2022-06-29 LAB — PSA: PSA: 0 ng/mL — ABNORMAL LOW (ref 0.10–4.00)

## 2022-06-29 NOTE — Addendum Note (Signed)
Addended by: Andrez Grime on: 06/29/2022 10:33 AM   Modules accepted: Orders

## 2022-06-29 NOTE — Progress Notes (Addendum)
Established Patient Office Visit   Subjective:  Patient ID: Christopher [name]., male    DOB: October 16, 1950  Age: 72 y.o. MRN: [id number]  Chief Complaint  Patient presents with   Medical Management of Chronic Issues    6 month follow up, concerns about hearing sometimes seems muffled or little crackling sounds.     HPI Encounter Diagnoses  Name Primary?   Healthcare maintenance Yes   Presbycusis of both ears    Essential hypertension    Insomnia, unspecified type    For follow-up of check.  Mirtazapine has helped sleep a great deal.  Blood pressure is controlled with Hyzaar.  Continues atorvastatin for elevated cholesterol.  Reports decreased hearing and ear congestion.  He is using Flonase intermittently.  Active physically around his house clearing things out after his leg cast.  He does walk 20 to 30 minutes daily.  Continues follow-up with hematology for thrombocytopenia.  Last colonoscopy was in 2019.   Review of Systems  Constitutional: Negative.   HENT:  Positive for hearing loss.   Eyes:  Negative for blurred vision, discharge and redness.  Respiratory: Negative.    Cardiovascular: Negative.   Gastrointestinal:  Negative for abdominal pain.  Genitourinary: Negative.   Musculoskeletal: Negative.  Negative for myalgias.  Skin:  Negative for rash.  Neurological:  Negative for tingling, loss of consciousness and weakness.  Endo/Heme/Allergies:  Negative for polydipsia.     Current Outpatient Medications:    ALPRAZolam (XANAX) 0.5 MG tablet, TAKE 1 TABLET BY MOUTH TWICE A DAY AS NEEDED, Disp: 60 tablet, Rfl: 0   atorvastatin (LIPITOR) 40 MG tablet, TAKE 1 TABLET BY MOUTH EVERY DAY, Disp: 90 tablet, Rfl: 4   cholecalciferol (VITAMIN D) 1000 units tablet, Take 5,000 Units by mouth daily., Disp: , Rfl:    fluticasone (FLONASE) 50 MCG/ACT nasal spray, Place 2 sprays into both nostrils daily., Disp: 16 g, Rfl: 6   losartan-hydrochlorothiazide (HYZAAR) 100-25 MG tablet, TAKE 1  TABLET BY MOUTH EVERY DAY, Disp: 90 tablet, Rfl: 3   mirtazapine (REMERON) 7.5 MG tablet, TAKE 1 TABLET BY MOUTH AT BEDTIME., Disp: 90 tablet, Rfl: 1   diphenhydrAMINE HCl, Sleep, (ZZZQUIL) 50 MG/30ML LIQD, Take by mouth. Has been taking about a teaspoon some nights (Patient not taking: Reported on 06/29/2022), Disp: , Rfl:    Objective:     BP 124/82 (BP Location: Right Arm, Patient Position: Sitting, Cuff Size: Large)   Pulse 65   Temp 97.8 F (36.6 C) (Temporal)   Ht  (1.854 m)   Wt 237 lb 9.6 oz (107.8 kg)   SpO2 97%   BMI 31.35 kg/m    Physical Exam Constitutional:      General: He is not in acute distress.    Appearance: Normal appearance. He is not ill-appearing, toxic-appearing or diaphoretic.  HENT:     Head: Normocephalic and atraumatic.     Right Ear: External ear normal.     Left Ear: External ear normal.     Ears:     Comments: Nonobstructing cerumen.  TMs are opacified.  Air conduction greater than bone conduction with tuning fork    Mouth/Throat:     Mouth: Mucous membranes are moist.     Pharynx: Oropharynx is clear. No oropharyngeal exudate or posterior oropharyngeal erythema.  Eyes:     General: No scleral icterus.       Right eye: No discharge.        Left eye: No discharge.  Extraocular Movements: Extraocular movements intact.     Conjunctiva/sclera: Conjunctivae normal.     Pupils: Pupils are equal, round, and reactive to light.  Cardiovascular:     Rate and Rhythm: Normal rate and regular rhythm.  Pulmonary:     Effort: Pulmonary effort is normal. No respiratory distress.     Breath sounds: Normal breath sounds.  Abdominal:     General: Bowel sounds are normal.     Tenderness: There is no abdominal tenderness. There is no guarding.     Hernia: A hernia is present. Hernia is present in the ventral area.  Musculoskeletal:     Cervical back: No rigidity or tenderness.  Skin:    General: Skin is warm and dry.  Neurological:     Mental  Status: He is alert and oriented to person, place, and time.  Psychiatric:        Mood and Affect: Mood normal.        Behavior: Behavior normal.      No results found for any visits on 06/29/22.    The 10-year ASCVD risk score (Arnett DK, et al., 2019) is: 17.6%    Assessment & Plan:   Healthcare maintenance -     Lipid panel -     PSA -     Urinalysis, Routine w reflex microscopic  Presbycusis of both ears -     Ambulatory referral to ENT  Essential hypertension -     Comprehensive metabolic panel -     Urinalysis, Routine w reflex microscopic  Insomnia, unspecified type    No follow-ups on file.   Continue all medicines as above.  Continue mirtazapine at low-dose.  Increase exercise by walking for 30 minutes daily.  Please use Flonase daily.  Information was given on health maintenance and disease prevention. Mliss Sax, MD

## 2022-07-01 ENCOUNTER — Telehealth: Payer: Self-pay | Admitting: Family Medicine

## 2022-07-01 NOTE — Telephone Encounter (Signed)
Pt called, has appt next week.   Would like to repeat PRP bil knees.  I have changed appt notes, just wanted to be sure this was OK with Dr. Katrinka Blazing and is this 1 injection or 2?

## 2022-07-01 NOTE — Telephone Encounter (Signed)
Confirmed last PRP was bil knees with 1 split injections, scheduled.

## 2022-07-06 NOTE — Progress Notes (Unsigned)
[name] [address] [place] [address] Phone: [phone] Subjective:   Bruce Donath, am serving as a scribe for Dr. Antoine Primas.  I'm seeing this patient by the request  of:  Mliss Sax, MD  CC: Bilateral knee pain  [id number]  Christopher [name]. is a 72 y.o. male coming in with complaint of B knee pain. Here for PRP today. Last seen March 2023. Patient states that he had pain with sit to stand. Also feels somewhat unstable. Overall feels like PRP has helped in the past.       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/01/2007   NISSEN FUNDOPLICATION, HX OF 07/28/2006   Unspecified essential hypertension 03/27/2007   Past Surgical History:  Procedure Laterality Date   NISSEN FUNDOPLICATION  2001   PROSTATECTOMY  2009   TONSILLECTOMY     Social History   Socioeconomic History   Marital status: Widowed    Spouse name: Not on file   Number of children: Not on file   Years of education: Not on file   Highest education level: Not on file  Occupational History   Not on file  Tobacco Use   Smoking status: Former   Smokeless tobacco: Never   Tobacco comments:    smoked in college  Vaping Use   Vaping Use: Never used  Substance and Sexual Activity   Alcohol use: Yes    Alcohol/week: 0.0 standard drinks of alcohol    Comment: rarely; once or twice yearly   Drug use: No   Sexual activity: Not on file  Other Topics Concern   Not on file  Social History Narrative   Not on file   Social Determinants of Health   Financial Resource Strain: Low Risk  (05/24/2022)   Overall Financial Resource Strain (CARDIA)    Difficulty of Paying Living Expenses: Not hard at all  Food Insecurity: No Food Insecurity (05/24/2022)   Hunger Vital Sign    Worried About Running Out of Food in the Last Year: Never true     Ran Out of Food in the Last Year: Never true  Transportation Needs: No Transportation Needs (05/24/2022)   PRAPARE - Administrator, Civil Service (Medical): No    Lack of Transportation (Non-Medical): No  Physical Activity: Sufficiently Active (05/24/2022)   Exercise Vital Sign    Days of Exercise per Week: 5 days    Minutes of Exercise per Session: 50 min  Stress: Stress Concern Present (05/24/2022)   [employer] of Occupational Health - Occupational Stress Questionnaire    Feeling of Stress : To some extent  Social Connections: Moderately Integrated (01/28/2021)   Social Connection and Isolation Panel [NHANES]    Frequency of Communication with Friends and Family: Twice a week    Frequency of Social Gatherings with Friends and Family: Twice a week    Attends Religious Services: 1 to 4 times per year    Active Member of [employer] or Organizations: No    Attends Banker Meetings: Never    Marital Status: Married   No Known Allergies Family History  Problem Relation Age of Onset   Heart disease Mother    Prostate cancer Father        prostate   Colon cancer Neg Hx      Current Outpatient Medications (Cardiovascular):  atorvastatin (LIPITOR) 40 MG tablet, TAKE 1 TABLET BY MOUTH EVERY DAY   losartan-hydrochlorothiazide (HYZAAR) 100-25 MG tablet, TAKE 1 TABLET BY MOUTH EVERY DAY  Current Outpatient Medications (Respiratory):    fluticasone (FLONASE) 50 MCG/ACT nasal spray, Place 2 sprays into both nostrils daily.    Current Outpatient Medications (Other):    ALPRAZolam (XANAX) 0.5 MG tablet, TAKE 1 TABLET BY MOUTH TWICE A DAY AS NEEDED   cholecalciferol (VITAMIN D) 1000 units tablet, Take 5,000 Units by mouth daily.   diphenhydrAMINE HCl, Sleep, (ZZZQUIL) 50 MG/30ML LIQD, Take by mouth. Has been taking about a teaspoon some nights   mirtazapine (REMERON) 7.5 MG tablet, TAKE 1 TABLET BY MOUTH AT BEDTIME.   Objective  Blood pressure 112/80,  pulse (!) 58, height 6\' 1"  (1.854 m), weight 239 lb (108.4 kg), SpO2 98 %.   General: No apparent distress alert and oriented x3 mood and affect normal, dressed appropriately.   After informed written and verbal consent, patient was seated on exam table. Right knee was prepped with alcohol swab and utilizing anterolateral approach, patient's right knee space was injected with 2 cc of Marcaine 0.5% and 3 cc of PRP. Patient tolerated the procedure well without immediate complications.  After informed written and verbal consent, patient was seated on exam table. Left knee was prepped with alcohol swab and utilizing anterolateral approach, patient's left knee space was injected with 2 cc of  marcaine 0.5%: And 3 cc of PRP. Patient tolerated the procedure well without immediate complications.    Impression and Recommendations:     The above documentation has been reviewed and is accurate and complete Judi Saa, DO

## 2022-07-07 ENCOUNTER — Ambulatory Visit (INDEPENDENT_AMBULATORY_CARE_PROVIDER_SITE_OTHER): Payer: Self-pay | Admitting: Family Medicine

## 2022-07-07 ENCOUNTER — Encounter: Payer: Self-pay | Admitting: Family Medicine

## 2022-07-07 VITALS — BP 112/80 | HR 58 | Ht 73.0 in | Wt 239.0 lb

## 2022-07-07 DIAGNOSIS — M170 Bilateral primary osteoarthritis of knee: Secondary | ICD-10-CM

## 2022-07-07 NOTE — Patient Instructions (Signed)
Good to see you No ice or IBU for 3 days Heat and Tylenol are ok See me again in 6 weeks 

## 2022-07-07 NOTE — Assessment & Plan Note (Addendum)
Repeat injection given today and tolerated the procedure well, discussed icing regimen and home exercises, after the first 72 hours which activities to do and which ones to avoid.  Follow-up with me again in 6 to 8 weeks

## 2022-07-26 ENCOUNTER — Other Ambulatory Visit: Payer: Self-pay | Admitting: Family Medicine

## 2022-07-29 DIAGNOSIS — H6993 Unspecified Eustachian tube disorder, bilateral: Secondary | ICD-10-CM | POA: Diagnosis not present

## 2022-08-18 ENCOUNTER — Ambulatory Visit: Payer: Medicare HMO | Admitting: Family Medicine

## 2022-08-23 DIAGNOSIS — L720 Epidermal cyst: Secondary | ICD-10-CM | POA: Diagnosis not present

## 2022-08-23 DIAGNOSIS — L0390 Cellulitis, unspecified: Secondary | ICD-10-CM | POA: Diagnosis not present

## 2022-08-23 DIAGNOSIS — L02212 Cutaneous abscess of back [any part, except buttock]: Secondary | ICD-10-CM | POA: Diagnosis not present

## 2022-08-25 IMAGING — XA DG FLUORO GUIDE NDL PLC/BX
1 series · 1 of 1 positions shown · IV contrast (multihance)
Comparison: none

CLINICAL DATA: Right shoulder pain.

EXAM:
RIGHT SHOULDER INJECTION UNDER FLUOROSCOPY
TECHNIQUE: An appropriate skin entrance site was determined. The site was
marked, prepped with Betadine, draped in the usual sterile fashion,
and infiltrated locally with 1% lidocaine. A 22 gauge spinal needle
was advanced to the superomedial margin of the humeral head under
intermittent fluoroscopy. 1 mL of 1% lidocaine injected easily. A
mixture of 0.1 mL of MultiHance, 15 mL of Isovue-M 200, and 5 mL of
sterile saline was then used to opacify the right shoulder capsule.
12 mL of this mixture were injected. No immediate complication.
FLUOROSCOPY TIME:  Fluoroscopy Time:  6 seconds
Radiation Exposure Index (if provided by the fluoroscopic device):
3.72 microGray*m^2
Number of Acquired Spot Images: 0

[Series 1: ortho standard · 1 of 1 slices shown]
[im 1/1]
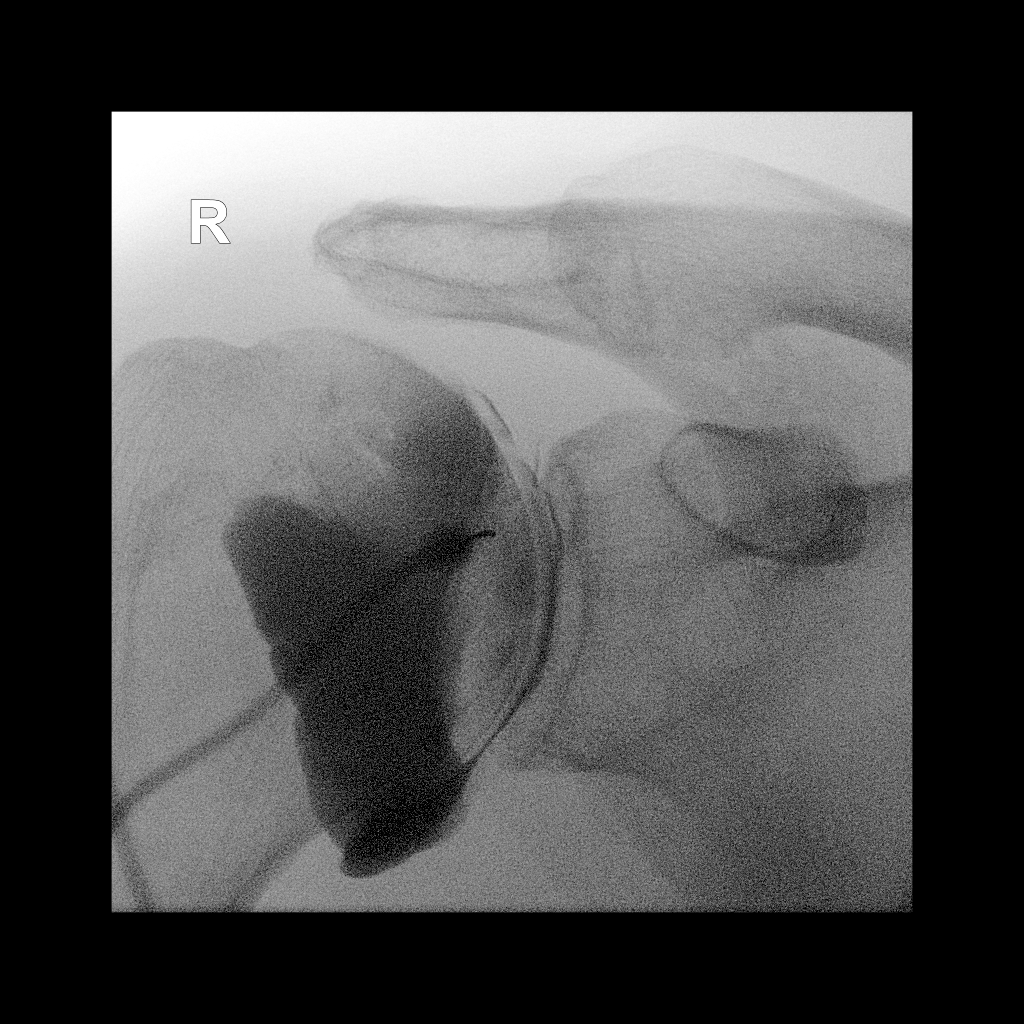

[1 of 1 positions shown; findings below may reference images not displayed]

IMPRESSION: Technically successful right shoulder injection for MRI.

## 2022-08-25 IMAGING — MR MR SHOULDER*R* W/CM
6 series · 40 of 40 positions shown · IV contrast (agent unspecified)
Comparison: Right shoulder x-rays dated October 17, 2019.

CLINICAL DATA: Chronic right shoulder pain since injury at the gym
10 months ago. No prior surgery.

EXAM:
MR ARTHROGRAM OF THE RIGHT SHOULDER
TECHNIQUE: Multiplanar, multisequence MR imaging of the right shoulder was
performed following the administration of intra-articular contrast.
CONTRAST:  See Injection Documentation.

[Series 3: T1 fat-sat · axial · 4.0mm · 0.27mm/px · z∈[-19,+64]mm · 6 of 18 slices shown (1 of 4)]
[im 1/18]
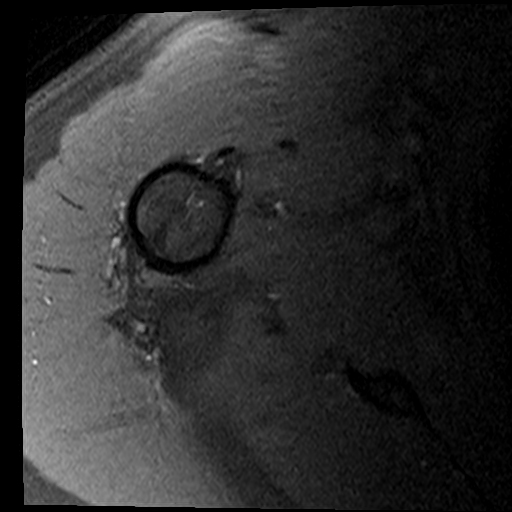
[im 4/18]
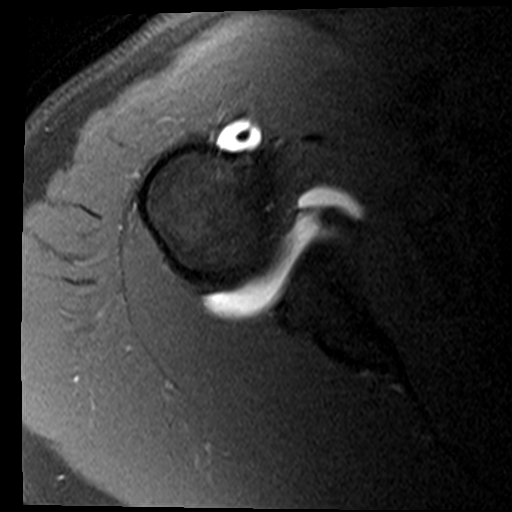
[im 7/18]
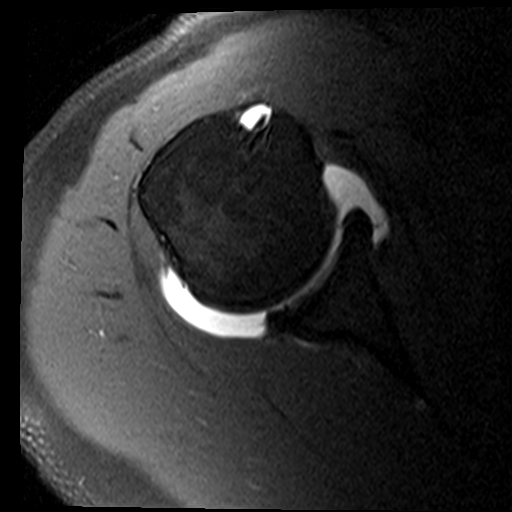
[im 11/18]
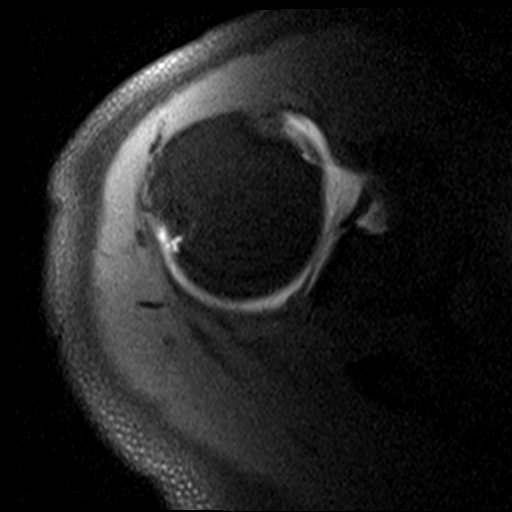
[im 14/18]
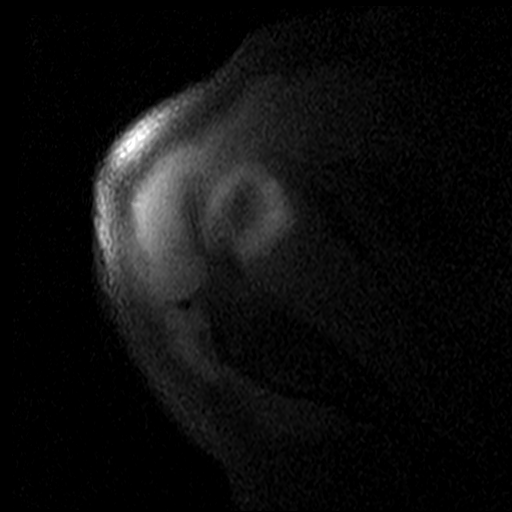
[im 18/18]
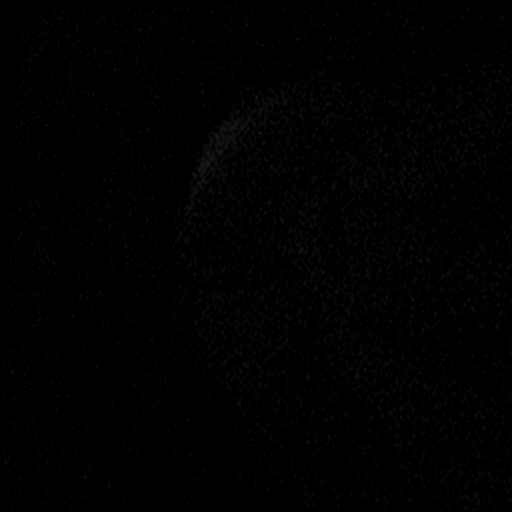

[Series 4: T2 fat-sat · coronal · 4.0mm · 0.55mm/px · 7 of 18 slices shown (1 of 2)]
[im 1/18]
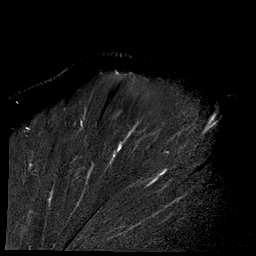
[im 3/18]
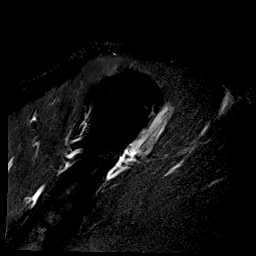
[im 6/18]
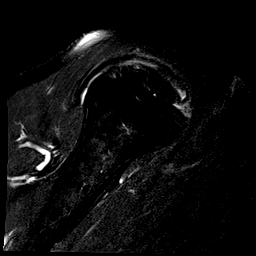
[im 9/18]
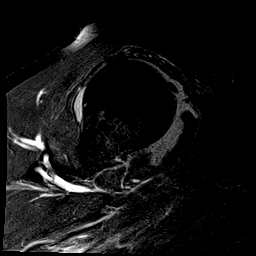
[im 12/18]
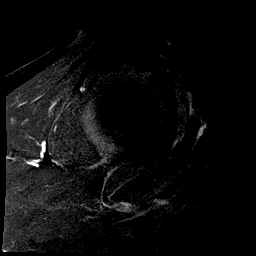
[im 15/18]
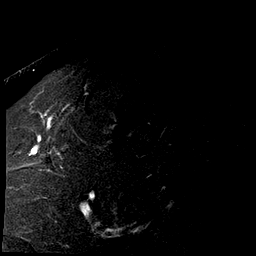
[im 18/18]
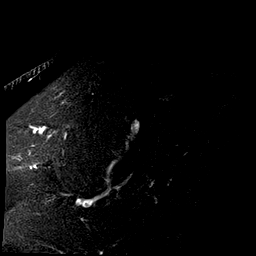

[Series 5: T1 fat-sat · oblique · 4.0mm · 0.55mm/px · 7 of 18 slices shown (2 of 4)]
[im 1/18]
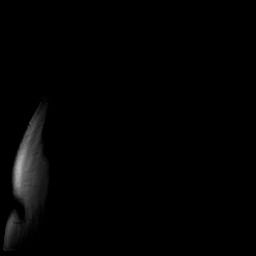
[im 3/18]
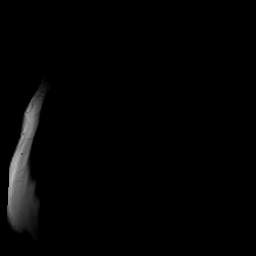
[im 6/18]
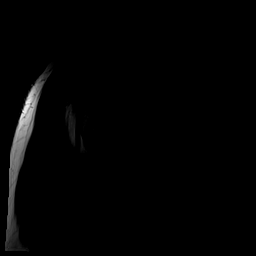
[im 9/18]
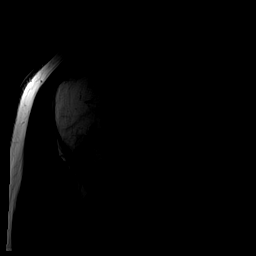
[im 12/18]
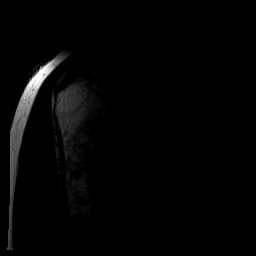
[im 15/18]
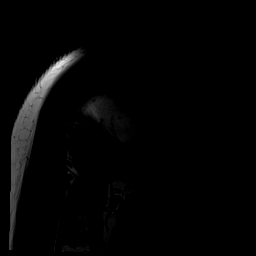
[im 18/18]
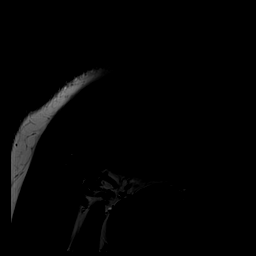

[Series 6: T1 fat-sat · oblique · 4.0mm · 0.55mm/px · 7 of 18 slices shown (3 of 4)]
[im 1/18]
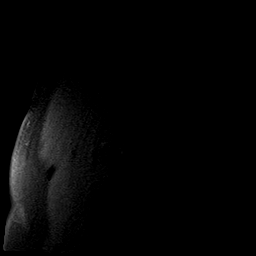
[im 3/18]
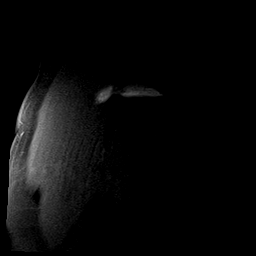
[im 6/18]
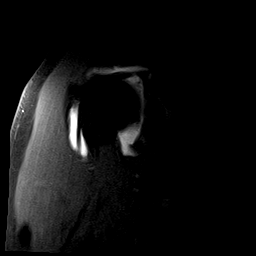
[im 9/18]
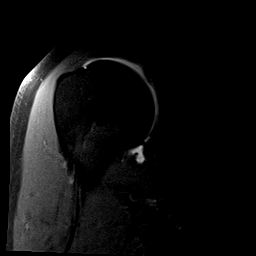
[im 12/18]
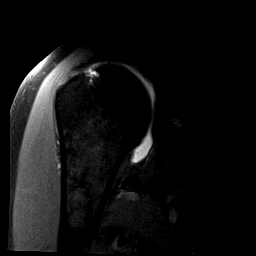
[im 15/18]
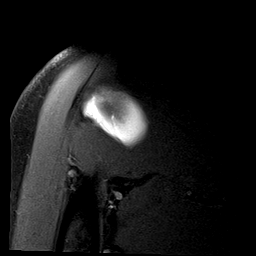
[im 18/18]
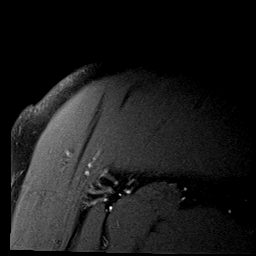

[Series 7: T2 fat-sat · oblique · 4.0mm · 0.55mm/px · 7 of 18 slices shown (2 of 2)]
[im 1/18]
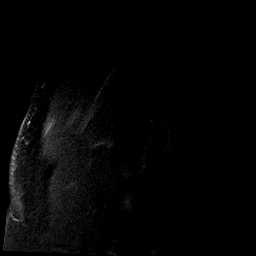
[im 3/18]
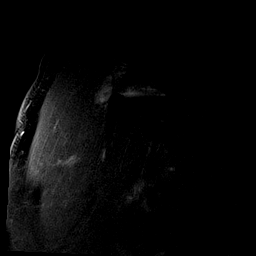
[im 6/18]
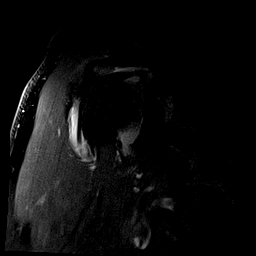
[im 9/18]
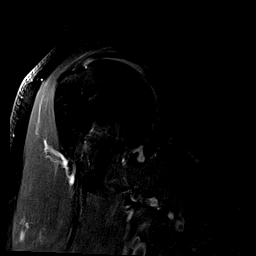
[im 12/18]
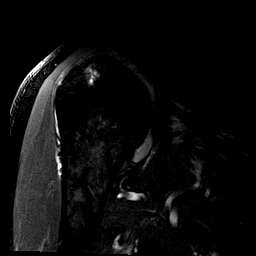
[im 15/18]
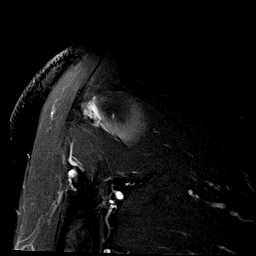
[im 18/18]
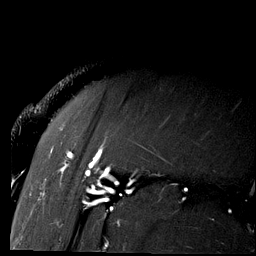

[Series 10: T1 fat-sat · sagittal · 4.0mm · 0.59mm/px · 6 of 16 slices shown (4 of 4)]
[im 1/16]
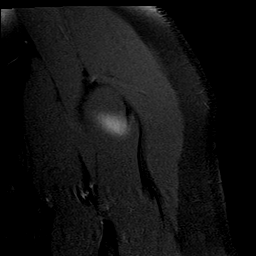
[im 4/16]
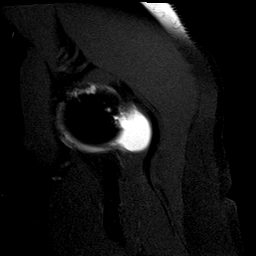
[im 7/16]
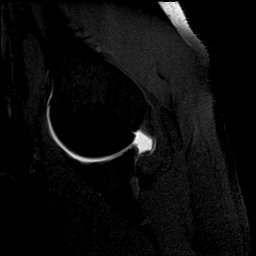
[im 10/16]
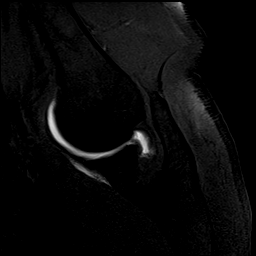
[im 13/16]
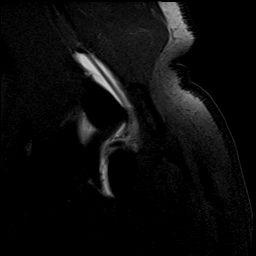
[im 16/16]
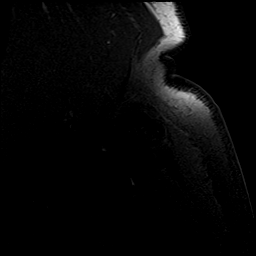

[40 of 40 positions shown; findings below may reference images not displayed]

FINDINGS: Rotator cuff:  Intact without significant tendinosis.

Muscles:  No focal muscular atrophy or edema.

Biceps long head:  Intact and normally positioned.

Acromioclavicular Joint: The acromion is type II. There are mild
acromioclavicular degenerative changes. No significant fluid or
contrast is present in the subacromial/subdeltoid bursa.

Glenohumeral Joint: Distended with intra-articular contrast. Mild
diffuse cartilage thinning without focal defect.

Labrum: Small amount of irregular contrast signal undercutting the
superior labrum anterior posterior to the biceps anchor. Remaining
labrum is intact.

Bones: Small reactive subcortical cystic changes in the posterior
humeral head. No suspicious bone lesion. No acute fracture or
dislocation.

Other: None.
IMPRESSION: 1. Small SLAP tear.
2. Intact rotator cuff.
3. Mild acromioclavicular osteoarthritis.

## 2022-09-01 ENCOUNTER — Other Ambulatory Visit: Payer: Self-pay | Admitting: Family Medicine

## 2022-09-16 DIAGNOSIS — L720 Epidermal cyst: Secondary | ICD-10-CM | POA: Diagnosis not present

## 2022-09-21 DIAGNOSIS — L720 Epidermal cyst: Secondary | ICD-10-CM | POA: Diagnosis not present

## 2022-09-30 ENCOUNTER — Ambulatory Visit: Payer: Medicare HMO | Admitting: Family Medicine

## 2022-10-19 DIAGNOSIS — L209 Atopic dermatitis, unspecified: Secondary | ICD-10-CM | POA: Diagnosis not present

## 2022-10-19 DIAGNOSIS — L720 Epidermal cyst: Secondary | ICD-10-CM | POA: Diagnosis not present

## 2022-10-20 ENCOUNTER — Other Ambulatory Visit: Payer: Self-pay | Admitting: Family

## 2022-10-26 NOTE — Telephone Encounter (Signed)
Pt has stated he has been waiting for 6 days for this refill. ALPRAZolam (XANAX) 0.5 MG tablet [[id number]]   [id number] - [place], [place] - [address], [place] [address] Phone: [phone]  Fax: [phone]

## 2022-10-28 ENCOUNTER — Other Ambulatory Visit: Payer: Self-pay | Admitting: Family Medicine

## 2022-10-28 ENCOUNTER — Other Ambulatory Visit: Payer: Self-pay

## 2022-10-28 DIAGNOSIS — F419 Anxiety disorder, unspecified: Secondary | ICD-10-CM

## 2022-10-28 MED ORDER — ALPRAZOLAM 0.5 MG PO TABS: 0.5000 mg | ORAL_TABLET | Freq: Two times a day (BID) | ORAL | 0 refills | Status: DC | PRN

## 2022-10-28 NOTE — Telephone Encounter (Signed)
Pt called asking about this med again. I saw it had been called in and I assured him the effort was made, but it seems the transmission has failed please resend. His patience is running out.

## 2022-11-01 DIAGNOSIS — H6993 Unspecified Eustachian tube disorder, bilateral: Secondary | ICD-10-CM | POA: Diagnosis not present

## 2022-11-01 DIAGNOSIS — H903 Sensorineural hearing loss, bilateral: Secondary | ICD-10-CM | POA: Diagnosis not present

## 2022-11-04 DIAGNOSIS — D485 Neoplasm of uncertain behavior of skin: Secondary | ICD-10-CM | POA: Diagnosis not present

## 2022-11-04 DIAGNOSIS — L720 Epidermal cyst: Secondary | ICD-10-CM | POA: Diagnosis not present

## 2022-11-11 ENCOUNTER — Ambulatory Visit: Payer: Medicare HMO | Admitting: Family Medicine

## 2022-12-07 ENCOUNTER — Other Ambulatory Visit: Payer: Self-pay | Admitting: Family Medicine

## 2022-12-07 DIAGNOSIS — F419 Anxiety disorder, unspecified: Secondary | ICD-10-CM

## 2022-12-07 DIAGNOSIS — G4709 Other insomnia: Secondary | ICD-10-CM

## 2022-12-30 ENCOUNTER — Encounter: Payer: Self-pay | Admitting: Family Medicine

## 2023-01-01 ENCOUNTER — Other Ambulatory Visit: Payer: Self-pay | Admitting: Family Medicine

## 2023-01-01 DIAGNOSIS — E785 Hyperlipidemia, unspecified: Secondary | ICD-10-CM

## 2023-01-05 ENCOUNTER — Ambulatory Visit: Payer: Medicare HMO | Admitting: Family Medicine

## 2023-01-06 ENCOUNTER — Encounter: Payer: Self-pay | Admitting: Family Medicine

## 2023-01-06 ENCOUNTER — Ambulatory Visit: Payer: Medicare HMO | Admitting: Family Medicine

## 2023-01-06 VITALS — BP 124/80 | HR 70 | Temp 97.6°F | Ht 73.0 in | Wt 242.6 lb

## 2023-01-06 DIAGNOSIS — I10 Essential (primary) hypertension: Secondary | ICD-10-CM

## 2023-01-06 DIAGNOSIS — F419 Anxiety disorder, unspecified: Secondary | ICD-10-CM

## 2023-01-06 DIAGNOSIS — E559 Vitamin D deficiency, unspecified: Secondary | ICD-10-CM | POA: Diagnosis not present

## 2023-01-06 DIAGNOSIS — Z131 Encounter for screening for diabetes mellitus: Secondary | ICD-10-CM | POA: Diagnosis not present

## 2023-01-06 DIAGNOSIS — G4709 Other insomnia: Secondary | ICD-10-CM

## 2023-01-06 DIAGNOSIS — F4321 Adjustment disorder with depressed mood: Secondary | ICD-10-CM

## 2023-01-06 DIAGNOSIS — Z23 Encounter for immunization: Secondary | ICD-10-CM

## 2023-01-06 LAB — HEMOGLOBIN A1C: Hgb A1c MFr Bld: 5.6 % (ref 4.6–6.5)

## 2023-01-06 LAB — VITAMIN D 25 HYDROXY (VIT D DEFICIENCY, FRACTURES): VITD: 51.57 ng/mL (ref 30.00–100.00)

## 2023-01-06 LAB — BASIC METABOLIC PANEL
BUN: 20 mg/dL (ref 6–23)
CO2: 29 meq/L (ref 19–32)
Calcium: 9.5 mg/dL (ref 8.4–10.5)
Chloride: 103 meq/L (ref 96–112)
Creatinine, Ser: 0.93 mg/dL (ref 0.40–1.50)
GFR: 82.06 mL/min (ref >60.00)
Glucose, Bld: 92 mg/dL (ref 70–99)
Potassium: 4.2 meq/L (ref 3.5–5.1)
Sodium: 139 meq/L (ref 135–145)

## 2023-01-06 NOTE — Progress Notes (Signed)
Established Patient Office Visit   Subjective:  Patient ID: Christopher Shakespear., male    DOB: 04/20/1950  Age: 72 y.o. MRN: [id number]  Chief Complaint  Patient presents with   Medical Management of Chronic Issues    6 month follow up. Pt is fasting. Flu shot today.     HPI Encounter Diagnoses  Name Primary?   Grieving Yes   Essential hypertension    Anxiety    Other insomnia    Vitamin D deficiency    Screening for diabetes mellitus    Need for immunization against influenza    Doing okay.  Of course he continues to grieve his wife's passing and some days are better than others but overall things are getting better.  Has made friends in his retirement community and that helps.  Has a younger brother who lives outside of [address] and another brother who lives between [place] and [place].  He is exercising by walking.  Mirtazapine continues to help with sleep.  Xanax twice daily.  Has gained weight.  He has stopped snacking especially on carbohydrates and believes this will help him lose the weight he has gained.   Review of Systems  Constitutional: Negative.   HENT: Negative.    Eyes:  Negative for blurred vision, discharge and redness.  Respiratory: Negative.    Cardiovascular: Negative.   Gastrointestinal:  Negative for abdominal pain.  Genitourinary: Negative.   Musculoskeletal: Negative.  Negative for myalgias.  Skin:  Negative for rash.  Neurological:  Negative for tingling, loss of consciousness and weakness.  Endo/Heme/Allergies:  Negative for polydipsia.     Current Outpatient Medications:    ALPRAZolam (XANAX) 0.5 MG tablet, TAKE 1 TABLET BY MOUTH 2 TIMES DAILY AS NEEDED., Disp: 60 tablet, Rfl: 0   atorvastatin (LIPITOR) 40 MG tablet, TAKE 1 TABLET BY MOUTH EVERY DAY, Disp: 90 tablet, Rfl: 1   cholecalciferol (VITAMIN D) 1000 units tablet, Take 5,000 Units by mouth daily., Disp: , Rfl:    losartan-hydrochlorothiazide (HYZAAR) 100-25 MG tablet, TAKE 1  TABLET BY MOUTH EVERY DAY, Disp: 90 tablet, Rfl: 3   mirtazapine (REMERON) 7.5 MG tablet, TAKE 1 TABLET BY MOUTH EVERYDAY AT BEDTIME, Disp: 90 tablet, Rfl: 1   diphenhydrAMINE HCl, Sleep, (ZZZQUIL) 50 MG/30ML LIQD, Take by mouth. Has been taking about a teaspoon some nights (Patient not taking: Reported on 01/06/2023), Disp: , Rfl:    fluticasone (FLONASE) 50 MCG/ACT nasal spray, Place 2 sprays into both nostrils daily. (Patient not taking: Reported on 01/06/2023), Disp: 16 g, Rfl: 6   Objective:     BP 124/80   Pulse 70   Temp 97.6 F (36.4 C)   Ht 6\' 1"  (1.854 m)   Wt 242 lb 9.6 oz (110 kg)   SpO2 96%   BMI 32.01 kg/m  BP Readings from Last 3 Encounters:  01/06/23 124/80  07/07/22 112/80  06/29/22 124/82   Wt Readings from Last 3 Encounters:  01/06/23 242 lb 9.6 oz (110 kg)  07/07/22 239 lb (108.4 kg)  06/29/22 237 lb 9.6 oz (107.8 kg)      Physical Exam Constitutional:      General: He is not in acute distress.    Appearance: Normal appearance. He is not ill-appearing, toxic-appearing or diaphoretic.  HENT:     Head: Normocephalic and atraumatic.     Right Ear: External ear normal.     Left Ear: External ear normal.     Mouth/Throat:  Mouth: Mucous membranes are moist.     Pharynx: Oropharynx is clear. No oropharyngeal exudate or posterior oropharyngeal erythema.  Eyes:     General: No scleral icterus.       Right eye: No discharge.        Left eye: No discharge.     Extraocular Movements: Extraocular movements intact.     Conjunctiva/sclera: Conjunctivae normal.     Pupils: Pupils are equal, round, and reactive to light.  Cardiovascular:     Rate and Rhythm: Normal rate and regular rhythm.  Pulmonary:     Effort: Pulmonary effort is normal. No respiratory distress.     Breath sounds: Normal breath sounds.  Musculoskeletal:     Cervical back: No rigidity or tenderness.  Skin:    General: Skin is warm and dry.  Neurological:     Mental Status: He is alert  and oriented to person, place, and time.  Psychiatric:        Mood and Affect: Mood normal.        Behavior: Behavior normal.      No results found for any visits on 01/06/23.    The ASCVD Risk score (Arnett DK, et al., 2019) failed to calculate for the following reasons:   The valid total cholesterol range is 130 to 320 mg/dL    Assessment & Plan:   Grieving  Essential hypertension -     Basic metabolic panel  Anxiety  Other insomnia  Vitamin D deficiency -     VITAMIN D 25 Hydroxy (Vit-D Deficiency, Fractures)  Screening for diabetes mellitus -     Basic metabolic panel -     Hemoglobin A1c  Need for immunization against influenza -     Flu Vaccine Trivalent High Dose (Fluad)    Return in about 6 months (around 07/06/2023), or if symptoms worsen or fail to improve.    Mliss Sax, MD

## 2023-01-13 ENCOUNTER — Encounter: Payer: Self-pay | Admitting: Family Medicine

## 2023-01-18 ENCOUNTER — Other Ambulatory Visit: Payer: Self-pay | Admitting: Family Medicine

## 2023-01-18 DIAGNOSIS — F419 Anxiety disorder, unspecified: Secondary | ICD-10-CM

## 2023-01-19 NOTE — Telephone Encounter (Signed)
Refill request for  Alprazolam 0.5 mg LR  12/08/22, #60, 0 rf LOV 01/06/23 FOV 07/04/23  Please review and advise.  Thanks.  Dm/cma

## 2023-02-08 DIAGNOSIS — H43813 Vitreous degeneration, bilateral: Secondary | ICD-10-CM | POA: Diagnosis not present

## 2023-02-08 DIAGNOSIS — H2513 Age-related nuclear cataract, bilateral: Secondary | ICD-10-CM | POA: Diagnosis not present

## 2023-02-08 DIAGNOSIS — H524 Presbyopia: Secondary | ICD-10-CM | POA: Diagnosis not present

## 2023-02-08 DIAGNOSIS — D3131 Benign neoplasm of right choroid: Secondary | ICD-10-CM | POA: Diagnosis not present

## 2023-02-08 DIAGNOSIS — H52203 Unspecified astigmatism, bilateral: Secondary | ICD-10-CM | POA: Diagnosis not present

## 2023-02-09 ENCOUNTER — Telehealth: Payer: Self-pay | Admitting: Family Medicine

## 2023-02-09 NOTE — Telephone Encounter (Signed)
Pt sent this is an appointment reminder. Before I call to schedule--is he a candidate for gel and should we run for February appt?  My knees are still doing okay as usual. I have seen a tv ad, so you know it must be true-snark- where a gel is injected into the patella. Would I be a candidate for something like this or is this just TV? The only knee difficulty I have is the same one as always--the knee angle when I'm about to sit or stand and that exact angle is still painful. I had to have help getting out of a rocker the other day as the pain at that angle was too much. I try to look for upright chairs with arms so I can push up or lower myself.    Can we reschedule for mid February late morning?

## 2023-02-09 NOTE — Telephone Encounter (Signed)
Sent patient MyChart message.

## 2023-02-16 ENCOUNTER — Encounter: Payer: Self-pay | Admitting: Family Medicine

## 2023-02-16 ENCOUNTER — Ambulatory Visit: Payer: Medicare HMO | Admitting: Family Medicine

## 2023-02-16 VITALS — BP 112/78 | HR 67 | Ht 73.0 in | Wt 242.0 lb

## 2023-02-16 DIAGNOSIS — M170 Bilateral primary osteoarthritis of knee: Secondary | ICD-10-CM

## 2023-02-16 NOTE — Patient Instructions (Addendum)
Injected both knees today See me again in 2 months

## 2023-02-16 NOTE — Progress Notes (Signed)
[name] [address] [place] [address] Phone: [phone] Subjective:   Christopher [name], am serving as a scribe for Dr. Antoine Primas.  I'm seeing this patient by the request  of:  Mliss Sax, MD  CC: Bilateral knee pain  [id number]  07/07/2022 Repeat injection given today and tolerated the procedure well, discussed icing regimen and home exercises, after the first 72 hours which activities to do and which ones to avoid.  Follow-up with me again in 6 to 8 weeks     Updated 02/16/2023 Christopher [name]. is a 72 y.o. male coming in with complaint of B knee pain. Pain with sit to stand.  Patient is having worsening pain in the knees again.  Has had this difficulty previously.  Rates the severity of pain right now 8 out of 10.       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/01/2007   NISSEN FUNDOPLICATION, HX OF 07/28/2006   Unspecified essential hypertension 03/27/2007   Past Surgical History:  Procedure Laterality Date   NISSEN FUNDOPLICATION  2001   PROSTATECTOMY  2009   TONSILLECTOMY     Social History   Socioeconomic History   Marital status: Widowed    Spouse name: Not on file   Number of children: Not on file   Years of education: Not on file   Highest education level: Not on file  Occupational History   Not on file  Tobacco Use   Smoking status: Former   Smokeless tobacco: Never   Tobacco comments:    smoked in college  Vaping Use   Vaping status: Never Used  Substance and Sexual Activity   Alcohol use: Yes    Alcohol/week: 0.0 standard drinks of alcohol    Comment: rarely; once or twice yearly   Drug use: No   Sexual activity: Not on file  Other Topics Concern   Not on file  Social History Narrative   Not on file   Social Determinants of Health   Financial Resource Strain: Low  Risk  (05/24/2022)   Overall Financial Resource Strain (CARDIA)    Difficulty of Paying Living Expenses: Not hard at all  Food Insecurity: No Food Insecurity (05/24/2022)   Hunger Vital Sign    Worried About Running Out of Food in the Last Year: Never true    Ran Out of Food in the Last Year: Never true  Transportation Needs: No Transportation Needs (05/24/2022)   PRAPARE - Administrator, Civil Service (Medical): No    Lack of Transportation (Non-Medical): No  Physical Activity: Sufficiently Active (05/24/2022)   Exercise Vital Sign    Days of Exercise per Week: 5 days    Minutes of Exercise per Session: 50 min  Stress: Stress Concern Present (05/24/2022)   [employer] of Occupational Health - Occupational Stress Questionnaire    Feeling of Stress : To some extent  Social Connections: Moderately Integrated (01/28/2021)   Social Connection and Isolation Panel [NHANES]    Frequency of Communication with Friends and Family: Twice a week    Frequency of Social Gatherings with Friends and Family: Twice a week    Attends Religious Services: 1 to 4 times per year    Active Member of [employer] or Organizations: No    Attends Banker Meetings: Never    Marital Status:  Married   No Known Allergies Family History  Problem Relation Age of Onset   Heart disease Mother    Prostate cancer Father        prostate   Colon cancer Neg Hx      Current Outpatient Medications (Cardiovascular):    atorvastatin (LIPITOR) 40 MG tablet, TAKE 1 TABLET BY MOUTH EVERY DAY   losartan-hydrochlorothiazide (HYZAAR) 100-25 MG tablet, TAKE 1 TABLET BY MOUTH EVERY DAY  Current Outpatient Medications (Respiratory):    fluticasone (FLONASE) 50 MCG/ACT nasal spray, Place 2 sprays into both nostrils daily.    Current Outpatient Medications (Other):    ALPRAZolam (XANAX) 0.5 MG tablet, TAKE 1 TABLET BY MOUTH TWICE A DAY AS NEEDED   cholecalciferol (VITAMIN D) 1000 units tablet, Take 5,000  Units by mouth daily.   diphenhydrAMINE HCl, Sleep, (ZZZQUIL) 50 MG/30ML LIQD, Take by mouth. Has been taking about a teaspoon some nights   mirtazapine (REMERON) 7.5 MG tablet, TAKE 1 TABLET BY MOUTH EVERYDAY AT BEDTIME   Reviewed prior external information including notes and imaging from  primary care provider As well as notes that were available from care everywhere and other healthcare systems.  Past medical history, social, surgical and family history all reviewed in electronic medical record.  No pertanent information unless stated regarding to the chief complaint.   Review of Systems:  No headache, visual changes, nausea, vomiting, diarrhea, constipation, dizziness, abdominal pain, skin rash, fevers, chills, night sweats, weight loss, swollen lymph nodes, body aches, joint swelling, chest pain, shortness of breath, mood changes. POSITIVE muscle aches  Objective  Blood pressure 112/78, pulse 67, height 6\' 1"  (1.854 m), weight 242 lb (109.8 kg), SpO2 98%.   General: No apparent distress alert and oriented x3 mood and affect normal, dressed appropriately.  HEENT: Pupils equal, extraocular movements intact  Respiratory: Patient's speak in full sentences and does not appear short of breath  Cardiovascular: No lower extremity edema, non tender, no erythema  Crepitus noted.  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  After informed written and verbal consent, patient was seated on exam table. Right knee was prepped with alcohol swab and utilizing anterolateral approach, patient's right knee space was injected with 4:1  marcaine 0.5%: Kenalog 40mg /dL. Patient tolerated the procedure well without immediate complications.  After informed written and verbal consent, patient was seated on exam table. Left knee was prepped with alcohol swab and utilizing anterolateral approach, patient's left knee space was injected with 4:1  marcaine 0.5%: Kenalog  40mg /dL. Patient tolerated the procedure well without immediate complications.    Impression and Recommendations:     The above documentation has been reviewed and is accurate and complete Judi Saa, DO

## 2023-02-16 NOTE — Assessment & Plan Note (Signed)
Patient has responded to PRP previously and will need to consider it.  We have also done the viscosupplementation which patient has done previously and did have some short resolution of pain.  We can consider repeating if needed.  Patient will consider it.  Follow-up with me again in 2 to 3 months.  Social determinants of health include patient is having difficulty walking more than 100 feet secondary to the discomfort and pain and decreasing physical activity.

## 2023-02-17 ENCOUNTER — Encounter: Payer: Self-pay | Admitting: Family Medicine

## 2023-02-23 ENCOUNTER — Other Ambulatory Visit: Payer: Self-pay | Admitting: Family Medicine

## 2023-02-23 DIAGNOSIS — F419 Anxiety disorder, unspecified: Secondary | ICD-10-CM

## 2023-02-23 NOTE — Telephone Encounter (Signed)
Please advise, see below.   Chart supports rx. Last OV: 01/06/2023 Next OV: 07/04/2023

## 2023-03-10 DIAGNOSIS — H5213 Myopia, bilateral: Secondary | ICD-10-CM | POA: Diagnosis not present

## 2023-03-10 DIAGNOSIS — H52223 Regular astigmatism, bilateral: Secondary | ICD-10-CM | POA: Diagnosis not present

## 2023-03-10 DIAGNOSIS — Z0100 Encounter for examination of eyes and vision without abnormal findings: Secondary | ICD-10-CM | POA: Diagnosis not present

## 2023-03-15 DIAGNOSIS — H5213 Myopia, bilateral: Secondary | ICD-10-CM | POA: Diagnosis not present

## 2023-03-15 DIAGNOSIS — H52223 Regular astigmatism, bilateral: Secondary | ICD-10-CM | POA: Diagnosis not present

## 2023-03-29 ENCOUNTER — Encounter: Payer: Self-pay | Admitting: Hematology & Oncology

## 2023-03-29 ENCOUNTER — Other Ambulatory Visit: Payer: Self-pay | Admitting: Family Medicine

## 2023-03-29 DIAGNOSIS — F419 Anxiety disorder, unspecified: Secondary | ICD-10-CM

## 2023-03-29 NOTE — Telephone Encounter (Signed)
01/06/2023 LOV  02/24/2023 Fill Date  60/0 refills

## 2023-03-30 ENCOUNTER — Inpatient Hospital Stay: Payer: Medicare HMO | Attending: Hematology & Oncology

## 2023-03-30 ENCOUNTER — Inpatient Hospital Stay: Payer: Medicare HMO | Admitting: Hematology & Oncology

## 2023-03-30 DIAGNOSIS — D696 Thrombocytopenia, unspecified: Secondary | ICD-10-CM | POA: Insufficient documentation

## 2023-04-01 ENCOUNTER — Other Ambulatory Visit: Payer: Self-pay | Admitting: *Deleted

## 2023-04-01 DIAGNOSIS — D696 Thrombocytopenia, unspecified: Secondary | ICD-10-CM

## 2023-04-04 ENCOUNTER — Inpatient Hospital Stay (HOSPITAL_BASED_OUTPATIENT_CLINIC_OR_DEPARTMENT_OTHER): Payer: Medicare HMO | Admitting: Family

## 2023-04-04 ENCOUNTER — Encounter: Payer: Self-pay | Admitting: Family

## 2023-04-04 ENCOUNTER — Inpatient Hospital Stay: Payer: Medicare HMO

## 2023-04-04 VITALS — BP 137/91 | HR 62 | Temp 97.6°F | Resp 18 | Ht 73.0 in | Wt 244.1 lb

## 2023-04-04 DIAGNOSIS — D696 Thrombocytopenia, unspecified: Secondary | ICD-10-CM | POA: Diagnosis not present

## 2023-04-04 LAB — CMP (CANCER CENTER ONLY)
ALT: 24 U/L (ref 0–44)
AST: 22 U/L (ref 15–41)
Albumin: 4.6 g/dL (ref 3.5–5.0)
Alkaline Phosphatase: 97 U/L (ref 38–126)
Anion gap: 5 (ref 5–15)
BUN: 16 mg/dL (ref 8–23)
CO2: 31 mmol/L (ref 22–32)
Calcium: 10.1 mg/dL (ref 8.9–10.3)
Chloride: 102 mmol/L (ref 98–111)
Creatinine: 0.93 mg/dL (ref 0.61–1.24)
GFR, Estimated: >60 mL/min (ref >60)
Glucose, Bld: 89 mg/dL (ref 70–99)
Potassium: 4.9 mmol/L (ref 3.5–5.1)
Sodium: 138 mmol/L (ref 135–145)
Total Bilirubin: 0.6 mg/dL (ref 0.0–1.2)
Total Protein: 6.5 g/dL (ref 6.5–8.1)

## 2023-04-04 LAB — CBC WITH DIFFERENTIAL (CANCER CENTER ONLY)
Abs Immature Granulocytes: 0.33 10*3/uL — ABNORMAL HIGH (ref 0.00–0.07)
Basophils Absolute: 0 10*3/uL (ref 0.0–0.1)
Basophils Relative: 0 %
Eosinophils Absolute: 0 10*3/uL (ref 0.0–0.5)
Eosinophils Relative: 0 %
HCT: 42.2 % (ref 39.0–52.0)
Hemoglobin: 13.8 g/dL (ref 13.0–17.0)
Immature Granulocytes: 5 %
Lymphocytes Relative: 32 %
Lymphs Abs: 1.9 10*3/uL (ref 0.7–4.0)
MCH: 28.6 pg (ref 26.0–34.0)
MCHC: 32.7 g/dL (ref 30.0–36.0)
MCV: 87.6 fL (ref 80.0–100.0)
Monocytes Absolute: 0.7 10*3/uL (ref 0.1–1.0)
Monocytes Relative: 11 %
Neutro Abs: 3.2 10*3/uL (ref 1.7–7.7)
Neutrophils Relative %: 52 %
Platelet Count: 121 10*3/uL — ABNORMAL LOW (ref 150–400)
RBC: 4.82 MIL/uL (ref 4.22–5.81)
RDW: 14.3 % (ref 11.5–15.5)
WBC Count: 6.2 10*3/uL (ref 4.0–10.5)
nRBC: 0 % (ref 0.0–0.2)

## 2023-04-04 LAB — SAVE SMEAR(SSMR), FOR PROVIDER SLIDE REVIEW

## 2023-04-04 NOTE — Progress Notes (Signed)
Hematology and Oncology Follow Up Visit  Quinzell Malcomb [id number] 05/17/1950 73 y.o. 04/04/2023   Principle Diagnosis:  Thrombocytopenia - chronic-mild   Current Therapy:        Observation   Interim History:  Mr. Cilento is here today for follow-up. He is doing well and platelet count today is stable at 121.  He has had no issue with blood loss. No abnormal bruising or petechiae.  No fever, chills, n/v, cough, rash, dizziness, chest pain, palpitations, abdominal pain or changes in bowel or bladder habits.  Occasional lightheadedness.  No swelling, numbness or tingling in his extremities.  No falls or syncope.  Appetite and hydration are good. Weight is stable at 244 lbs.  He has been walking around his neighborhood for exercise and fresh air.   ECOG Performance Status: 0 - Asymptomatic  Medications:  Allergies as of 04/04/2023   No Known Allergies      Medication List        Accurate as of April 04, 2023 12:01 PM. If you have any questions, ask your nurse or doctor.          STOP taking these medications    fluticasone 50 MCG/ACT nasal spray Commonly known as: FLONASE Stopped by: Eileen Stanford   ZzzQuil 50 MG/30ML Liqd Generic drug: diphenhydrAMINE HCl (Sleep) Stopped by: Eileen Stanford       TAKE these medications    ALPRAZolam 0.5 MG tablet Commonly known as: XANAX TAKE 1 TABLET BY MOUTH TWICE A DAY AS NEEDED   atorvastatin 40 MG tablet Commonly known as: LIPITOR TAKE 1 TABLET BY MOUTH EVERY DAY   cholecalciferol 1000 units tablet Commonly known as: VITAMIN D Take 5,000 Units by mouth daily.   losartan-hydrochlorothiazide 100-25 MG tablet Commonly known as: HYZAAR TAKE 1 TABLET BY MOUTH EVERY DAY   mirtazapine 7.5 MG tablet Commonly known as: REMERON TAKE 1 TABLET BY MOUTH EVERYDAY AT BEDTIME        Allergies: No Known Allergies  Past Medical History, Surgical history, Social history, and Family History were reviewed and  updated.  Review of Systems: All other 10 point review of systems is negative.   Physical Exam:  height is 6\' 1"  (1.854 m) and weight is 244 lb 1.3 oz (110.7 kg). His oral temperature is 97.6 F (36.4 C). His blood pressure is 137/91 (abnormal) and his pulse is 62. His respiration is 18 and oxygen saturation is 100%.   Wt Readings from Last 3 Encounters:  04/04/23 244 lb 1.3 oz (110.7 kg)  02/16/23 242 lb (109.8 kg)  01/06/23 242 lb 9.6 oz (110 kg)    Ocular: Sclerae unicteric, pupils equal, round and reactive to light Ear-nose-throat: Oropharynx clear, dentition fair Lymphatic: No cervical or supraclavicular adenopathy Lungs no rales or rhonchi, good excursion bilaterally Heart regular rate and rhythm, no murmur appreciated Abd soft, nontender, positive bowel sounds MSK no focal spinal tenderness, no joint edema Neuro: non-focal, well-oriented, appropriate affect Breasts: Deferred   Lab Results  Component Value Date   WBC 6.2 04/04/2023   HGB 13.8 04/04/2023   HCT 42.2 04/04/2023   MCV 87.6 04/04/2023   PLT 121 (L) 04/04/2023   No results found for: "FERRITIN", "IRON", "TIBC", "UIBC", "IRONPCTSAT" Lab Results  Component Value Date   RBC 4.82 04/04/2023   No results found for: "KPAFRELGTCHN", "LAMBDASER", "KAPLAMBRATIO" No results found for: "IGGSERUM", "IGA", "IGMSERUM" No results found for: "TOTALPROTELP", "ALBUMINELP", "A1GS", "A2GS", "BETS", "BETA2SER", "GAMS", "MSPIKE", "SPEI"   Chemistry  Component Value Date/Time   NA 139 01/06/2023 1005   K 4.2 01/06/2023 1005   CL 103 01/06/2023 1005   CO2 29 01/06/2023 1005   BUN 20 01/06/2023 1005   CREATININE 0.93 01/06/2023 1005   CREATININE 0.87 03/29/2022 0938      Component Value Date/Time   CALCIUM 9.5 01/06/2023 1005   ALKPHOS 93 06/29/2022 1034   AST 22 06/29/2022 1034   AST 22 03/29/2022 0938   ALT 21 06/29/2022 1034   ALT 25 03/29/2022 0938   BILITOT 0.6 06/29/2022 1034   BILITOT 0.5 03/29/2022 0960        Impression and Plan: Mr. Scheel is a very pleasant 73 yo gentleman with mild thrombocytopenia.  Counts are stable at this time and patient is asymptomatic.  Follow-up again in 1 year.   Eileen Stanford, NP 1/27/202512:01 PM

## 2023-04-17 ENCOUNTER — Other Ambulatory Visit: Payer: Self-pay | Admitting: Family Medicine

## 2023-04-17 DIAGNOSIS — I998 Other disorder of circulatory system: Secondary | ICD-10-CM

## 2023-04-19 NOTE — Progress Notes (Deleted)
 [name] [address] [place] [address] Phone: [phone] Subjective:    I'm seeing this patient by the request  of:  Mliss Sax, MD  CC:   [id number]  02/16/2023 Patient has responded to PRP previously and will need to consider it.  We have also done the viscosupplementation which patient has done previously and did have some short resolution of pain.  We can consider repeating if needed.  Patient will consider it.  Follow-up with me again in 2 to 3 months.  Social determinants of health include patient is having difficulty walking more than 100 feet secondary to the discomfort and pain and decreasing physical activity.      Update 04/20/2023 [name]. is a 73 y.o. male coming in with complaint of B knee pain. 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/01/2007   NISSEN FUNDOPLICATION, HX OF 07/28/2006   Unspecified essential hypertension 03/27/2007   Past Surgical History:  Procedure Laterality Date   NISSEN FUNDOPLICATION  2001   PROSTATECTOMY  2009   TONSILLECTOMY     Social History   Socioeconomic History   Marital status: Widowed    Spouse name: Not on file   Number of children: Not on file   Years of education: Not on file   Highest education level: Not on file  Occupational History   Not on file  Tobacco Use   Smoking status: Former   Smokeless tobacco: Never   Tobacco comments:    smoked in college  Vaping Use   Vaping status: Never Used  Substance and Sexual Activity   Alcohol use: Yes    Alcohol/week: 0.0 standard drinks of alcohol    Comment: rarely; once or twice yearly   Drug use: No   Sexual activity: Not on file  Other Topics Concern   Not on file  Social History Narrative   Not on file   Social Drivers of Health   Financial Resource  Strain: Low Risk  (05/24/2022)   Overall Financial Resource Strain (CARDIA)    Difficulty of Paying Living Expenses: Not hard at all  Food Insecurity: No Food Insecurity (05/24/2022)   Hunger Vital Sign    Worried About Running Out of Food in the Last Year: Never true    Ran Out of Food in the Last Year: Never true  Transportation Needs: No Transportation Needs (05/24/2022)   PRAPARE - Administrator, Civil Service (Medical): No    Lack of Transportation (Non-Medical): No  Physical Activity: Sufficiently Active (05/24/2022)   Exercise Vital Sign    Days of Exercise per Week: 5 days    Minutes of Exercise per Session: 50 min  Stress: Stress Concern Present (05/24/2022)   [employer] of Occupational Health - Occupational Stress Questionnaire    Feeling of Stress : To some extent  Social Connections: Moderately Integrated (01/28/2021)   Social Connection and Isolation Panel [NHANES]    Frequency of Communication with Friends and Family: Twice a week    Frequency of Social Gatherings with Friends and Family: Twice a week    Attends Religious Services: 1 to 4 times per year    Active Member of [employer] or Organizations: No    Attends Banker Meetings: Never    Marital Status: Married  No Known Allergies Family History  Problem Relation Age of Onset   Heart disease Mother    Prostate cancer Father        prostate   Colon cancer Neg Hx      Current Outpatient Medications (Cardiovascular):    atorvastatin (LIPITOR) 40 MG tablet, TAKE 1 TABLET BY MOUTH EVERY DAY   losartan-hydrochlorothiazide (HYZAAR) 100-25 MG tablet, TAKE 1 TABLET BY MOUTH EVERY DAY     Current Outpatient Medications (Other):    ALPRAZolam (XANAX) 0.5 MG tablet, TAKE 1 TABLET BY MOUTH TWICE A DAY AS NEEDED   cholecalciferol (VITAMIN D) 1000 units tablet, Take 5,000 Units by mouth daily.   mirtazapine (REMERON) 7.5 MG tablet, TAKE 1 TABLET BY MOUTH EVERYDAY AT BEDTIME   Reviewed prior  external information including notes and imaging from  primary care provider As well as notes that were available from care everywhere and other healthcare systems.  Past medical history, social, surgical and family history all reviewed in electronic medical record.  No pertanent information unless stated regarding to the chief complaint.   Review of Systems:  No headache, visual changes, nausea, vomiting, diarrhea, constipation, dizziness, abdominal pain, skin rash, fevers, chills, night sweats, weight loss, swollen lymph nodes, body aches, joint swelling, chest pain, shortness of breath, mood changes. POSITIVE muscle aches  Objective  There were no vitals taken for this visit.   General: No apparent distress alert and oriented x3 mood and affect normal, dressed appropriately.  HEENT: Pupils equal, extraocular movements intact  Respiratory: Patient's speak in full sentences and does not appear short of breath  Cardiovascular: No lower extremity edema, non tender, no erythema      Impression and Recommendations:

## 2023-04-20 ENCOUNTER — Ambulatory Visit: Payer: Medicare HMO | Admitting: Family Medicine

## 2023-04-30 ENCOUNTER — Other Ambulatory Visit: Payer: Self-pay | Admitting: Family Medicine

## 2023-04-30 DIAGNOSIS — F419 Anxiety disorder, unspecified: Secondary | ICD-10-CM

## 2023-05-03 NOTE — Progress Notes (Unsigned)
 [name] [address] [place] [address] Phone: [phone] Subjective:   Christopher [name] am a scribe for Dr. Katrinka Blazing.   I'm seeing this patient by the request  of:  Mliss Sax, MD  CC: bilateral knee pain   [id number]  02/16/2023 Patient has responded to PRP previously and will need to consider it.  We have also done the viscosupplementation which patient has done previously and did have some short resolution of pain.  We can consider repeating if needed.  Patient will consider it.  Follow-up with me again in 2 to 3 months.  Social determinants of health include patient is having difficulty walking more than 100 feet secondary to the discomfort and pain and decreasing physical activity.      Update 05/04/2023 Christopher [name]. is a 73 y.o. male coming in with complaint of B knee pain.  Patient was last given injections February 16, 2023.  Patient states they are ok. Injection in December and the knee have been progressively getting worse.  Patient states that it is affecting daily activities.  Sometimes wakes him up at night.     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/01/2007   NISSEN FUNDOPLICATION, HX OF 07/28/2006   Unspecified essential hypertension 03/27/2007   Past Surgical History:  Procedure Laterality Date   NISSEN FUNDOPLICATION  2001   PROSTATECTOMY  2009   TONSILLECTOMY     Social History   Socioeconomic History   Marital status: Widowed    Spouse name: Not on file   Number of children: Not on file   Years of education: Not on file   Highest education level: Not on file  Occupational History   Not on file  Tobacco Use   Smoking status: Former   Smokeless tobacco: Never   Tobacco comments:    smoked in college  Vaping Use   Vaping status: Never Used  Substance and Sexual Activity    Alcohol use: Yes    Alcohol/week: 0.0 standard drinks of alcohol    Comment: rarely; once or twice yearly   Drug use: No   Sexual activity: Not on file  Other Topics Concern   Not on file  Social History Narrative   Not on file   Social Drivers of Health   Financial Resource Strain: Low Risk  (05/24/2022)   Overall Financial Resource Strain (CARDIA)    Difficulty of Paying Living Expenses: Not hard at all  Food Insecurity: No Food Insecurity (05/24/2022)   Hunger Vital Sign    Worried About Running Out of Food in the Last Year: Never true    Ran Out of Food in the Last Year: Never true  Transportation Needs: No Transportation Needs (05/24/2022)   PRAPARE - Administrator, Civil Service (Medical): No    Lack of Transportation (Non-Medical): No  Physical Activity: Sufficiently Active (05/24/2022)   Exercise Vital Sign    Days of Exercise per Week: 5 days    Minutes of Exercise per Session: 50 min  Stress: Stress Concern Present (05/24/2022)   [employer] of Occupational Health - Occupational Stress Questionnaire    Feeling of Stress : To some extent  Social Connections: Moderately Integrated (01/28/2021)   Social Connection and Isolation Panel [NHANES]    Frequency of Communication with Friends and Family: Twice a week  Frequency of Social Gatherings with Friends and Family: Twice a week    Attends Religious Services: 1 to 4 times per year    Active Member of [employer] or Organizations: No    Attends Engineer, structural: Never    Marital Status: Married   No Known Allergies Family History  Problem Relation Age of Onset   Heart disease Mother    Prostate cancer Father        prostate   Colon cancer Neg Hx      Current Outpatient Medications (Cardiovascular):    atorvastatin (LIPITOR) 40 MG tablet, TAKE 1 TABLET BY MOUTH EVERY DAY   losartan-hydrochlorothiazide (HYZAAR) 100-25 MG tablet, TAKE 1 TABLET BY MOUTH EVERY DAY     Current Outpatient  Medications (Other):    ALPRAZolam (XANAX) 0.5 MG tablet, TAKE 1 TABLET BY MOUTH TWICE A DAY AS NEEDED   cholecalciferol (VITAMIN D) 1000 units tablet, Take 5,000 Units by mouth daily.   ciprofloxacin (CIPRO) 500 MG tablet, Take 1 tablet (500 mg total) by mouth 2 (two) times daily for 10 days.   doxycycline (VIBRA-TABS) 100 MG tablet, Take 1 tablet (100 mg total) by mouth 2 (two) times daily for 7 days.   mirtazapine (REMERON) 7.5 MG tablet, TAKE 1 TABLET BY MOUTH EVERYDAY AT BEDTIME   Reviewed prior external information including notes and imaging from  primary care provider As well as notes that were available from care everywhere and other healthcare systems.  Past medical history, social, surgical and family history all reviewed in electronic medical record.  No pertanent information unless stated regarding to the chief complaint.   Review of Systems:  No headache, visual changes, nausea, vomiting, diarrhea, constipation, dizziness, abdominal pain, skin rash, fevers, chills, night sweats, weight loss, swollen lymph nodes, body aches, joint swelling, chest pain, shortness of breath, mood changes. POSITIVE muscle aches  Objective  Blood pressure 124/70, pulse 71, height 6\' 1"  (1.854 m), weight 244 lb 3.2 oz (110.8 kg), SpO2 96%.   General: No apparent distress alert and oriented x3 mood and affect normal, dressed appropriately.  HEENT: Pupils equal, extraocular movements intact  Respiratory: Patient's speak in full sentences and does not appear short of breath  Cardiovascular: No lower extremity edema, non tender, no erythema  Bilateral knee exam shows arthritic changes noted.  Crepitus noted.  Trace effusion noted to the patellofemoral joints bilaterally.  After informed written and verbal consent, patient was seated on exam table. Right knee was prepped with alcohol swab and utilizing anterolateral approach, patient's right knee space was injected with 60 mg per 3 mL of Durolane (sodium  hyaluronate) in a prefilled syringe was injected easily into the knee through a 22-gauge needle..Patient tolerated the procedure well without immediate complications.  After informed written and verbal consent, patient was seated on exam table. Left knee was prepped with alcohol swab and utilizing anterolateral approach, patient's left knee space was injected with 60 mg per 3 mL of Durolane (sodium hyaluronate) in a prefilled syringe was injected easily into the knee through a 22-gauge needle..Patient tolerated the procedure well without immediate complications.     Impression and Recommendations:    The above documentation has been reviewed and is accurate and complete Judi Saa, DO

## 2023-05-04 ENCOUNTER — Ambulatory Visit: Payer: Medicare HMO | Admitting: Family Medicine

## 2023-05-04 ENCOUNTER — Encounter: Payer: Self-pay | Admitting: Family Medicine

## 2023-05-04 VITALS — BP 124/70 | HR 71 | Ht 73.0 in | Wt 244.2 lb

## 2023-05-04 DIAGNOSIS — M170 Bilateral primary osteoarthritis of knee: Secondary | ICD-10-CM | POA: Diagnosis not present

## 2023-05-04 MED ORDER — CIPROFLOXACIN HCL 500 MG PO TABS: 500.0000 mg | ORAL_TABLET | Freq: Two times a day (BID) | ORAL | 0 refills | Status: AC

## 2023-05-04 MED ORDER — DOXYCYCLINE HYCLATE 100 MG PO TABS: 100.0000 mg | ORAL_TABLET | Freq: Two times a day (BID) | ORAL | 0 refills | Status: AC

## 2023-05-04 MED ORDER — SODIUM HYALURONATE 60 MG/3ML IX PRSY
120.0000 mg | PREFILLED_SYRINGE | Freq: Once | INTRA_ARTICULAR | Status: AC
  Administered 2023-05-04: 120 mg via INTRA_ARTICULAR

## 2023-05-04 NOTE — Assessment & Plan Note (Addendum)
 Viscosupplementation given today.  Chronic problem with exacerbation.  Seems to do relatively well though with injections every 10 to 12 weeks.  We discussed continuing to stay active, VMO strengthening, icing regimen, discussed which activities to do and which ones to avoid.  Increase activity slowly.  Follow-up with me again in 3 months.

## 2023-05-04 NOTE — Patient Instructions (Addendum)
 Gel injections in knees today Cipro for 10 days and Doxycycline 10 days See you again in 2-3 months

## 2023-05-17 DIAGNOSIS — L720 Epidermal cyst: Secondary | ICD-10-CM | POA: Diagnosis not present

## 2023-06-04 ENCOUNTER — Other Ambulatory Visit: Payer: Self-pay | Admitting: Family Medicine

## 2023-06-04 DIAGNOSIS — G4709 Other insomnia: Secondary | ICD-10-CM

## 2023-06-04 DIAGNOSIS — F419 Anxiety disorder, unspecified: Secondary | ICD-10-CM

## 2023-06-06 ENCOUNTER — Ambulatory Visit (INDEPENDENT_AMBULATORY_CARE_PROVIDER_SITE_OTHER): Payer: Medicare HMO

## 2023-06-06 ENCOUNTER — Other Ambulatory Visit: Payer: Self-pay

## 2023-06-06 ENCOUNTER — Encounter: Payer: Self-pay | Admitting: Family Medicine

## 2023-06-06 VITALS — BP 124/72 | Ht 73.0 in | Wt 244.0 lb

## 2023-06-06 DIAGNOSIS — G4709 Other insomnia: Secondary | ICD-10-CM

## 2023-06-06 DIAGNOSIS — Z0000 Encounter for general adult medical examination without abnormal findings: Secondary | ICD-10-CM | POA: Diagnosis not present

## 2023-06-06 MED ORDER — MIRTAZAPINE 7.5 MG PO TABS: 7.5000 mg | ORAL_TABLET | Freq: Every day | ORAL | 1 refills | Status: DC

## 2023-06-06 NOTE — Progress Notes (Signed)
 Please attest and cosign this visit due to patients primary care provider not being in the office at the time the visit was completed.  Because this visit was a virtual/telehealth visit,  certain criteria was not obtained, such a blood pressure, CBG if applicable, and timed get up and go. Any medications not marked as "taking" were not mentioned during the medication reconciliation part of the visit. Any vitals not documented were not able to be obtained due to this being a telehealth visit or patient was unable to self-report a recent blood pressure reading due to a lack of equipment at home via telehealth. Vitals that have been documented are verbally provided by the patient.   Subjective:   Christopher [name]. is a 73 y.o. who presents for a Medicare Wellness preventive visit.  Visit Complete: Virtual I connected with  Christopher [name]. on 06/06/23 by a audio enabled telemedicine application and verified that I am speaking with the correct person using two identifiers.  Patient Location: Home  Provider Location: Home Office  I discussed the limitations of evaluation and management by telemedicine. The patient expressed understanding and agreed to proceed.  Vital Signs: Because this visit was a virtual/telehealth visit, some criteria may be missing or patient reported. Any vitals not documented were not able to be obtained and vitals that have been documented are patient reported.  VideoDeclined- This patient declined Librarian, academic. Therefore the visit was completed with audio only.  Persons Participating in Visit: Patient.  AWV Questionnaire: No: Patient Medicare AWV questionnaire was not completed prior to this visit.  Cardiac Risk Factors include: advanced age (>31men, >35 women);dyslipidemia;male gender;obesity (BMI >30kg/m2)     Objective:    Today's Vitals   06/06/23 1453  BP: 124/72  Weight: 244 lb (110.7 kg)  Height: 6\' 1"  (1.854 m)  PainSc:  4    Body mass index is 32.19 kg/m.     06/06/2023    2:57 PM 04/04/2023   11:52 AM 05/24/2022   11:04 AM 03/29/2022   10:09 AM 09/09/2021    9:34 AM 01/28/2021    3:13 PM 12/11/2020   11:06 AM  Advanced Directives  Does Patient Have a Medical Advance Directive? No Yes Yes Yes Yes Yes Yes  Type of Furniture conservator/restorer;Living will Healthcare Power of [place];Living will Living will;Healthcare Power of [employer] Power of [place];Living will Healthcare Power of [place];Living will Healthcare Power of [place];Living will  Does patient want to make changes to medical advance directive?  No - Patient declined  No - Patient declined No - Patient declined  No - Patient declined  Copy of Healthcare Power of Attorney in Chart?  No - copy requested No - copy requested  No - copy requested No - copy requested No - copy requested  Would patient like information on creating a medical advance directive? No - Patient declined          Current Medications (verified) Outpatient Encounter Medications as of 06/06/2023  Medication Sig   ALPRAZolam (XANAX) 0.5 MG tablet TAKE 1 TABLET BY MOUTH TWICE A DAY AS NEEDED   atorvastatin (LIPITOR) 40 MG tablet TAKE 1 TABLET BY MOUTH EVERY DAY   cholecalciferol (VITAMIN D) 1000 units tablet Take 5,000 Units by mouth daily.   losartan-hydrochlorothiazide (HYZAAR) 100-25 MG tablet TAKE 1 TABLET BY MOUTH EVERY DAY   mirtazapine (REMERON) 7.5 MG tablet Take 1 tablet (7.5 mg total) by mouth at bedtime.  No facility-administered encounter medications on file as of 06/06/2023.    Allergies (verified) Patient has no known allergies.   History: 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/01/2007   NISSEN FUNDOPLICATION, HX OF 07/28/2006   Unspecified essential hypertension 03/27/2007   Past Surgical History:   Procedure Laterality Date   NISSEN FUNDOPLICATION  2001   PROSTATECTOMY  2009   TONSILLECTOMY     Family History  Problem Relation Age of Onset   Heart disease Mother    Prostate cancer Father        prostate   Colon cancer Neg Hx    Social History   Socioeconomic History   Marital status: Widowed    Spouse name: Not on file   Number of children: Not on file   Years of education: Not on file   Highest education level: Not on file  Occupational History   Not on file  Tobacco Use   Smoking status: Former   Smokeless tobacco: Never   Tobacco comments:    smoked in college  Vaping Use   Vaping status: Never Used  Substance and Sexual Activity   Alcohol use: Yes    Alcohol/week: 0.0 standard drinks of alcohol    Comment: rarely; once or twice yearly   Drug use: No   Sexual activity: Not on file  Other Topics Concern   Not on file  Social History Narrative   Not on file   Social Drivers of Health   Financial Resource Strain: Low Risk  (06/06/2023)   Overall Financial Resource Strain (CARDIA)    Difficulty of Paying Living Expenses: Not hard at all  Food Insecurity: No Food Insecurity (06/06/2023)   Hunger Vital Sign    Worried About Running Out of Food in the Last Year: Never true    Ran Out of Food in the Last Year: Never true  Transportation Needs: No Transportation Needs (06/06/2023)   PRAPARE - Administrator, Civil Service (Medical): No    Lack of Transportation (Non-Medical): No  Physical Activity: Sufficiently Active (06/06/2023)   Exercise Vital Sign    Days of Exercise per Week: 7 days    Minutes of Exercise per Session: 30 min  Stress: No Stress Concern Present (06/06/2023)   [employer] of Occupational Health - Occupational Stress Questionnaire    Feeling of Stress : Not at all  Social Connections: Moderately Integrated (06/06/2023)   Social Connection and Isolation Panel [NHANES]    Frequency of Communication with Friends and Family:  More than three times a week    Frequency of Social Gatherings with Friends and Family: More than three times a week    Attends Religious Services: More than 4 times per year    Active Member of [employer] or Organizations: Yes    Attends Banker Meetings: More than 4 times per year    Marital Status: Widowed    Tobacco Counseling Counseling given: Yes Tobacco comments: smoked in college    Clinical Intake:  Pre-visit preparation completed: Yes  Pain : 0-10 Pain Score: 4  (when he gets up and is walking.) Pain Type: Chronic pain Pain Location: Knee Pain Orientation: Right, Left Pain Descriptors / Indicators: Constant, Sharp, Other (Comment) (sharp pinch. pain is worse when he is in the "hinged" position where he almost standing or almost sitting) Pain Onset: More than a month ago  Pain Frequency: Constant (worse with walking. lessens with sitting)     BMI - recorded: 32.19 Nutritional Status: BMI > 30  Obese Nutritional Risks: None Diabetes: No  Lab Results  Component Value Date   HGBA1C 5.6 01/06/2023     How often do you need to have someone help you when you read instructions, pamphlets, or other written materials from your doctor or pharmacy?: 1 - Never  Interpreter Needed?: No  Information entered by :: Maryjean Ka CMA   Activities of Daily Living     06/06/2023    2:55 PM  In your present state of health, do you have any difficulty performing the following activities:  Hearing? 1  Comment wears hearing aids  Vision? 0  Difficulty concentrating or making decisions? 0  Walking or climbing stairs? 0  Dressing or bathing? 0  Doing errands, shopping? 0  Preparing Food and eating ? N  Using the Toilet? N  In the past six months, have you accidently leaked urine? N  Do you have problems with loss of bowel control? N  Managing your Medications? N  Managing your Finances? N  Housekeeping or managing your Housekeeping? N    Patient Care Team: Mliss Sax, MD as PCP - General (Family Medicine)  Indicate any recent Medical Services you may have received from other than [facility] providers in the past year (date may be approximate).     Assessment:   This is a routine wellness examination for Christopher [name].  Hearing/Vision screen Hearing Screening - Comments:: Patient wears hearing aids. Is up to date with exams.  Vision Screening - Comments:: Patient is up to date with yearly exams and sees Dr. Burgess Estelle at [facility]. Wears glasses for reading only.    Goals Addressed             This Visit's Progress    Patient Stated   On track    Eat healthier and work on getting shoulder feeling better       Depression Screen     06/06/2023    2:59 PM 06/29/2022   10:11 AM 06/29/2022    9:29 AM 05/25/2022    4:16 PM 05/25/2022    2:55 PM 05/24/2022   11:04 AM 12/24/2021    9:22 AM  PHQ 2/9 Scores  PHQ - 2 Score 0 0 0 0 1 0 0  PHQ- 9 Score 0 1  5       Fall Risk     06/06/2023    2:58 PM 06/29/2022    9:30 AM 05/25/2022    2:55 PM 05/24/2022   11:04 AM 12/24/2021    9:22 AM  Fall Risk   Falls in the past year? 0 0 0 0 0  Number falls in past yr: 0 0 0 0 0  Injury with Fall? 0 0 0 0   Risk for fall due to : No Fall Risks No Fall Risks No Fall Risks Medication side effect   Follow up Falls prevention discussed;Falls evaluation completed Falls evaluation completed Falls evaluation completed Falls prevention discussed;Education provided;Falls evaluation completed     MEDICARE RISK AT HOME:  Medicare Risk at Home Any stairs in or around the home?: No If so, are there any without handrails?: No Home free of loose throw rugs in walkways, pet beds, electrical cords, etc?: Yes Adequate lighting in your home to reduce risk of falls?: Yes Life alert?: No Use of a cane, walker or w/c?: No Grab bars in  the bathroom?: Yes Shower chair or bench in shower?: Yes Elevated toilet seat or a handicapped toilet?: Yes  TIMED UP AND  GO:  Was the test performed?  No  Cognitive Function: 6CIT completed        06/06/2023    2:59 PM 05/24/2022   11:07 AM 12/27/2019    9:13 AM  6CIT Screen  What Year? 0 points 0 points 0 points  What month? 0 points 0 points 0 points  What time? 0 points 0 points 0 points  Count back from 20 0 points 0 points 0 points  Months in reverse 0 points 0 points 0 points  Repeat phrase 0 points 2 points 0 points  Total Score 0 points 2 points 0 points    Immunizations Immunization History  Administered Date(s) Administered   Fluad Quad(high Dose 65+) 12/01/2018, 12/17/2019, 12/24/2020   Fluad Trivalent(High Dose 65+) 01/06/2023   Influenza, High Dose Seasonal PF 12/15/2017, 12/24/2021   Influenza,inj,Quad PF,6+ Mos 02/08/2013   Moderna Sars-Covid-2 Vaccination 04/02/2019, 04/30/2019   PNEUMOCOCCAL CONJUGATE-20 06/24/2021   Pneumococcal Conjugate-13 12/23/2015   Tdap 02/09/2011, 08/30/2018   Zoster Recombinant(Shingrix) 07/01/2021, 01/09/2022   Zoster, Live 02/07/2012    Screening Tests Health Maintenance  Topic Date Due   Medicare Annual Wellness (AWV)  06/05/2024   Colonoscopy  08/17/2025   DTaP/Tdap/Td (3 - Td or Tdap) 08/29/2028   Pneumonia Vaccine 29+ Years old  Completed   INFLUENZA VACCINE  Completed   Zoster Vaccines- Shingrix  Completed   HPV VACCINES  Aged Out   COVID-19 Vaccine  Discontinued   Hepatitis C Screening  Discontinued    Health Maintenance  There are no preventive care reminders to display for this patient. Health Maintenance Items Addressed: Health Maintenance is up to date.   Additional Screening:  Vision Screening: Recommended annual ophthalmology exams for early detection of glaucoma and other disorders of the eye.  Dental Screening: Recommended annual dental exams for proper oral hygiene  Community Resource Referral / Chronic Care Management: CRR required this visit?  No   CCM required this visit?  No     Plan:     I have  personally reviewed and noted the following in the patient's chart:   Medical and social history Use of alcohol, tobacco or illicit drugs  Current medications and supplements including opioid prescriptions. Patient is not currently taking opioid prescriptions. Functional ability and status Nutritional status Physical activity Advanced directives List of other physicians Hospitalizations, surgeries, and ER visits in previous 12 months Vitals Screenings to include cognitive, depression, and falls Referrals and appointments  In addition, I have reviewed and discussed with patient certain preventive protocols, quality metrics, and best practice recommendations. A written personalized care plan for preventive services as well as general preventive health recommendations were provided to patient.     Jordan Hawks Arvon Schreiner, CMA   06/06/2023   After Visit Summary: (MyChart) Due to this being a telephonic visit, the after visit summary with patients personalized plan was offered to patient via MyChart   Notes: Please refer to Routing Comments.

## 2023-06-06 NOTE — Patient Instructions (Signed)
 Christopher [name] , Thank you for taking time to come for your Medicare Wellness Visit. I appreciate your ongoing commitment to your health goals. Please review the following plan we discussed and let me know if I can assist you in the future.   Referrals/Orders/Follow-Ups/Clinician Recommendations:  Next Medicare Annual Wellness Visit:   June 08, 2024 at 10:10 am video  This is a list of the screening recommended for you and due dates:  Health Maintenance  Topic Date Due   Medicare Annual Wellness Visit  06/05/2024   Colon Cancer Screening  08/17/2025   DTaP/Tdap/Td vaccine (3 - Td or Tdap) 08/29/2028   Pneumonia Vaccine  Completed   Flu Shot  Completed   Zoster (Shingles) Vaccine  Completed   HPV Vaccine  Aged Out   COVID-19 Vaccine  Discontinued   Hepatitis C Screening  Discontinued    Advanced directives: Information on Advanced Care Planning can be found at [facility] Advance Health Care Directives Advance Health Care Directives ([email or web address]   Advance Care Planning is important because it:  [x]  Makes sure you receive the medical care that is consistent with your values, goals, and preferences  [x]  It provides guidance to your family and loved ones and it also reduces their decisional burden about whether or not they are making the right decisions based on what you want done  Follow the link provided in your after visit summary or read over the paperwork we have mailed to you to help you started getting your Advance Directives in place. If you need assistance in completing these, please reach out to [place] so that we can help you!   Next Medicare Annual Wellness Visit scheduled for next year: yes  Understanding Your Risk for Falls Millions of people have serious injuries from falls each year. It is important to understand your risk of falling. Talk with your health care provider about your risk and what you can do to lower it. If you do have a serious fall, make sure to  tell your provider. Falling once raises your risk of falling again. How can falls affect me? Serious injuries from falls are common. These include: Broken bones, such as hip fractures. Head injuries, such as traumatic brain injuries (TBI) or concussions. A fear of falling can cause you to avoid activities and stay at home. This can make your muscles weaker and raise your risk for a fall. What can increase my risk? There are a number of risk factors that increase your risk for falling. The more risk factors you have, the higher your risk of falling. Serious injuries from a fall happen most often to people who are older than 73 years old. Teenagers and young adults ages 20-29 are also at higher risk. Common risk factors include: Weakness in the lower body. Being generally weak or confused due to long-term (chronic) illness. Dizziness or balance problems. Poor vision. Medicines that cause dizziness or drowsiness. These may include: Medicines for your blood pressure, heart, anxiety, insomnia, or swelling (edema). Pain medicines. Muscle relaxants. Other risk factors include: Drinking alcohol. Having had a fall in the past. Having foot pain or wearing improper footwear. Working at a dangerous job. Having any of the following in your home: Tripping hazards, such as floor clutter or loose rugs. Poor lighting. Pets. Having dementia or memory loss. What actions can I take to lower my risk of falling?     Physical activity Stay physically fit. Do strength and balance exercises. Consider taking  a regular class to build strength and balance. Yoga and tai chi are good options. Vision Have your eyes checked every year and your prescription for glasses or contacts updated as needed. Shoes and walking aids Wear non-skid shoes. Wear shoes that have rubber soles and low heels. Do not wear high heels. Do not walk around the house in socks or slippers. Use a cane or walker as told by your  provider. Home safety Attach secure railings on both sides of your stairs. Install grab bars for your bathtub, shower, and toilet. Use a non-skid mat in your bathtub or shower. Attach bath mats securely with double-sided, non-slip rug tape. Use good lighting in all rooms. Keep a flashlight near your bed. Make sure there is a clear path from your bed to the bathroom. Use night-lights. Do not use throw rugs. Make sure all carpeting is taped or tacked down securely. Remove all clutter from walkways and stairways, including extension cords. Repair uneven or broken steps and floors. Avoid walking on icy or slippery surfaces. Walk on the grass instead of on icy or slick sidewalks. Use ice melter to get rid of ice on walkways in the winter. Use a cordless phone. Questions to ask your health care provider Can you help me check my risk for a fall? Do any of my medicines make me more likely to fall? Should I take a vitamin D supplement? What exercises can I do to improve my strength and balance? Should I make an appointment to have my vision checked? Do I need a bone density test to check for weak bones (osteoporosis)? Would it help to use a cane or a walker? Where to find more information Centers for Disease Control and Prevention, STEADI: TonerPromos.no Community-Based Fall Prevention Programs: TonerPromos.no [employer] on Aging: BaseRingTones.pl Contact a health care provider if: You fall at home. You are afraid of falling at home. You feel weak, drowsy, or dizzy. This information is not intended to replace advice given to you by your health care provider. Make sure you discuss any questions you have with your health care provider. Document Revised: 10/26/2021 Document Reviewed: 10/26/2021 Elsevier Patient Education  2024 [employer].

## 2023-06-07 ENCOUNTER — Other Ambulatory Visit: Payer: Self-pay | Admitting: Family

## 2023-07-04 ENCOUNTER — Encounter: Payer: Self-pay | Admitting: Family Medicine

## 2023-07-04 ENCOUNTER — Ambulatory Visit (INDEPENDENT_AMBULATORY_CARE_PROVIDER_SITE_OTHER): Payer: Medicare HMO | Admitting: Family Medicine

## 2023-07-04 VITALS — BP 128/86 | HR 62 | Temp 97.7°F | Ht 73.0 in | Wt 243.2 lb

## 2023-07-04 DIAGNOSIS — I10 Essential (primary) hypertension: Secondary | ICD-10-CM | POA: Diagnosis not present

## 2023-07-04 DIAGNOSIS — E785 Hyperlipidemia, unspecified: Secondary | ICD-10-CM | POA: Diagnosis not present

## 2023-07-04 DIAGNOSIS — F419 Anxiety disorder, unspecified: Secondary | ICD-10-CM

## 2023-07-04 DIAGNOSIS — Z125 Encounter for screening for malignant neoplasm of prostate: Secondary | ICD-10-CM

## 2023-07-04 DIAGNOSIS — G4700 Insomnia, unspecified: Secondary | ICD-10-CM | POA: Diagnosis not present

## 2023-07-04 LAB — URINALYSIS, ROUTINE W REFLEX MICROSCOPIC
Bilirubin Urine: NEGATIVE
Hgb urine dipstick: NEGATIVE
Ketones, ur: NEGATIVE
Leukocytes,Ua: NEGATIVE
Nitrite: NEGATIVE
RBC / HPF: NONE SEEN (ref 0–?)
Specific Gravity, Urine: 1.025 (ref 1.000–1.030)
Total Protein, Urine: NEGATIVE
Urine Glucose: NEGATIVE
Urobilinogen, UA: 0.2 (ref 0.0–1.0)
pH: 6 (ref 5.0–8.0)

## 2023-07-04 LAB — LIPID PANEL
Cholesterol: 120 mg/dL (ref 0–200)
HDL: 41.8 mg/dL (ref >39.00)
LDL Cholesterol: 63 mg/dL (ref 0–99)
NonHDL: 77.88
Total CHOL/HDL Ratio: 3
Triglycerides: 74 mg/dL (ref 0.0–149.0)
VLDL: 14.8 mg/dL (ref 0.0–40.0)

## 2023-07-04 LAB — BASIC METABOLIC PANEL WITH GFR
BUN: 13 mg/dL (ref 6–23)
CO2: 27 meq/L (ref 19–32)
Calcium: 9.2 mg/dL (ref 8.4–10.5)
Chloride: 104 meq/L (ref 96–112)
Creatinine, Ser: 0.89 mg/dL (ref 0.40–1.50)
GFR: 85.35 mL/min (ref >60.00)
Glucose, Bld: 87 mg/dL (ref 70–99)
Potassium: 4.5 meq/L (ref 3.5–5.1)
Sodium: 139 meq/L (ref 135–145)

## 2023-07-04 LAB — PSA: PSA: 0 ng/mL — ABNORMAL LOW (ref 0.10–4.00)

## 2023-07-04 LAB — TSH: TSH: 2.13 u[IU]/mL (ref 0.35–5.50)

## 2023-07-04 NOTE — Progress Notes (Signed)
 Established Patient Office Visit   Subjective:  Patient ID: Christopher [name]., male    DOB: 1951-02-06  Age: 73 y.o. MRN: [id number]  Chief Complaint  Patient presents with   Medical Management of Chronic Issues    6 month follow up. Pt is fasting.     HPI Encounter Diagnoses  Name Primary?   Essential hypertension Yes   Anxiety    Dyslipidemia    Insomnia, unspecified type    Screening for prostate cancer    Follow-up of above.  Continues Hyzaar 100/25 for control of hypertension.  Xanax  0.5 twice daily.  Continues mirtazapine  7.5 at bedtime for sleep.  Continues atorvastatin  40 mg daily for elevated cholesterol.  Exercising by walking.  He has 2 surviving brothers that he sees on occasion.  He has no children.  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   Review of Systems  Constitutional: Negative.   HENT: Negative.    Eyes:  Negative for blurred vision, discharge and redness.  Respiratory: Negative.    Cardiovascular: Negative.   Gastrointestinal:  Negative for abdominal pain.  Genitourinary: Negative.   Musculoskeletal: Negative.  Negative for myalgias.  Skin:  Negative for rash.  Neurological:  Negative for tingling, loss of consciousness and weakness.  Endo/Heme/Allergies:  Negative for polydipsia.     Current Outpatient Medications:    ALPRAZolam  (XANAX ) 0.5 MG tablet, TAKE 1 TABLET BY MOUTH TWICE A DAY AS NEEDED, Disp: 60 tablet, Rfl: 0   atorvastatin  (LIPITOR) 40 MG tablet, TAKE 1 TABLET BY MOUTH EVERY DAY, Disp: 90 tablet, Rfl: 1   cholecalciferol (VITAMIN D ) 1000 units tablet, Take 5,000 Units by mouth daily., Disp: , Rfl:    losartan -hydrochlorothiazide  (HYZAAR) 100-25 MG tablet, TAKE 1 TABLET BY MOUTH EVERY DAY, Disp: 90 tablet, Rfl: 3   mirtazapine  (REMERON ) 7.5 MG tablet, Take 1 tablet (7.5 mg total) by mouth at bedtime., Disp: 90 tablet, Rfl: 1   Objective:     BP 128/86   Pulse 62   Temp 97.7 F  (36.5 C) (Temporal)   Ht 6\' 1"  (1.854 m)   Wt 243 lb 3.2 oz (110.3 kg)   SpO2 98%   BMI 32.09 kg/m  BP Readings from Last 3 Encounters:  07/04/23 128/86  06/06/23 124/72  05/04/23 124/70   Wt Readings from Last 3 Encounters:  07/04/23 243 lb 3.2 oz (110.3 kg)  06/06/23 244 lb (110.7 kg)  05/04/23 244 lb 3.2 oz (110.8 kg)      Physical Exam Constitutional:      General: He is not in acute distress.    Appearance: Normal appearance. He is not ill-appearing, toxic-appearing or diaphoretic.  HENT:     Head: Normocephalic and atraumatic.     Right Ear: Tympanic membrane, ear canal and external ear normal.     Left Ear: Tympanic membrane, ear canal and external ear normal.     Mouth/Throat:     Mouth: Mucous membranes are moist.     Pharynx: Oropharynx is clear. No oropharyngeal exudate or posterior oropharyngeal erythema.  Eyes:     General: No scleral icterus.       Right eye: No discharge.        Left eye: No discharge.     Extraocular Movements: Extraocular movements intact.     Conjunctiva/sclera: Conjunctivae normal.     Pupils: Pupils are equal, round, and reactive to light.  Cardiovascular:  Rate and Rhythm: Normal rate and regular rhythm.  Pulmonary:     Effort: Pulmonary effort is normal. No respiratory distress.     Breath sounds: Normal breath sounds. No wheezing, rhonchi or rales.  Abdominal:     General: Bowel sounds are normal.  Musculoskeletal:     Cervical back: No rigidity or tenderness.  Lymphadenopathy:     Cervical: No cervical adenopathy.  Skin:    General: Skin is warm and dry.  Neurological:     Mental Status: He is alert and oriented to person, place, and time.  Psychiatric:        Mood and Affect: Mood normal.        Behavior: Behavior normal.      No results found for any visits on 07/04/23.    The ASCVD Risk score (Arnett DK, et al., 2019) failed to calculate for the following reasons:   The valid total cholesterol range is 130  to 320 mg/dL    Assessment & Plan:   Essential hypertension -     Basic metabolic panel with GFR -     Urinalysis, Routine w reflex microscopic  Anxiety -     TSH  Dyslipidemia -     Lipid panel  Insomnia, unspecified type -     TSH  Screening for prostate cancer -     PSA    Return in about 6 months (around 01/03/2024).    Tonna Frederic, MD

## 2023-07-05 ENCOUNTER — Encounter: Payer: Self-pay | Admitting: Family Medicine

## 2023-07-07 ENCOUNTER — Other Ambulatory Visit: Payer: Self-pay | Admitting: Family

## 2023-07-07 DIAGNOSIS — F419 Anxiety disorder, unspecified: Secondary | ICD-10-CM

## 2023-07-09 ENCOUNTER — Other Ambulatory Visit: Payer: Self-pay | Admitting: Family Medicine

## 2023-07-09 DIAGNOSIS — F419 Anxiety disorder, unspecified: Secondary | ICD-10-CM

## 2023-07-11 MED ORDER — ALPRAZOLAM 0.5 MG PO TABS: 0.5000 mg | ORAL_TABLET | Freq: Two times a day (BID) | ORAL | 2 refills | Status: DC | PRN

## 2023-07-18 DIAGNOSIS — L720 Epidermal cyst: Secondary | ICD-10-CM | POA: Diagnosis not present

## 2023-07-29 NOTE — Progress Notes (Unsigned)
 [facility] [address] [place] [address] Phone: [phone] Subjective:   Christopher [name], am serving as a scribe for Dr. Ronnell Coins.  I'[name] seeing this patient by the request  of:  Tonna Frederic, MD  CC: bilateral knee pain   [id number]  05/04/2023 Viscosupplementation given today.  Chronic problem with exacerbation.  Seems to do relatively well though with injections every 10 to 12 weeks.  We discussed continuing to stay active, VMO strengthening, icing regimen, discussed which activities to do and which ones to avoid.  Increase activity slowly.  Follow-up with me again in 3 months.      Update 08/03/2023 Christopher [name]. is a 73 y.o. male coming in with complaint of B knee pain. 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   NISSEN FUNDOPLICATION, HX OF 07/28/2006   Unspecified essential hypertension 03/27/2007   Past Surgical History:  Procedure Laterality Date   NISSEN FUNDOPLICATION  2001   PROSTATECTOMY  2009   TONSILLECTOMY     Social History   Socioeconomic History   Marital status: Widowed    Spouse name: Not on file   Number of children: Not on file   Years of education: Not on file   Highest education level: Bachelor's degree (e.g., BA, AB, BS)  Occupational History   Not on file  Tobacco Use   Smoking status: Former   Smokeless tobacco: Never   Tobacco comments:    smoked in college  Vaping Use   Vaping status: Never Used  Substance and Sexual Activity   Alcohol use: Yes    Alcohol/week: 0.0 standard drinks of alcohol    Comment: rarely; once or twice yearly   Drug use: No   Sexual activity: Not on file  Other Topics Concern   Not on file  Social History Narrative   Not on file   Social Drivers  of Health   Financial Resource Strain: Low Risk  (07/03/2023)   Overall Financial Resource Strain (CARDIA)    Difficulty of Paying Living Expenses: Not hard at all  Food Insecurity: No Food Insecurity (07/03/2023)   Hunger Vital Sign    Worried About Running Out of Food in the Last Year: Never true    Ran Out of Food in the Last Year: Never true  Transportation Needs: No Transportation Needs (07/03/2023)   PRAPARE - Administrator, Civil Service (Medical): No    Lack of Transportation (Non-Medical): No  Physical Activity: Insufficiently Active (07/03/2023)   Exercise Vital Sign    Days of Exercise per Week: 5 days    Minutes of Exercise per Session: 20 min  Stress: No Stress Concern Present (07/03/2023)   [employer] of Occupational Health - Occupational Stress Questionnaire    Feeling of Stress : Not at all  Social Connections: Moderately Integrated (07/03/2023)   Social Connection and Isolation Panel [NHANES]    Frequency of Communication with Friends and Family: More than three times a week    Frequency of Social Gatherings with Friends and Family: Twice a week    Attends Religious Services: More than 4 times per year    Active Member of [employer] or Organizations: Yes    Attends Banker Meetings: More than 4  times per year    Marital Status: Widowed   No Known Allergies Family History  Problem Relation Age of Onset   Heart disease Mother    Prostate cancer Father        prostate   Colon cancer Neg Hx      Current Outpatient Medications (Cardiovascular):    atorvastatin  (LIPITOR) 40 MG tablet, TAKE 1 TABLET BY MOUTH EVERY DAY   losartan -hydrochlorothiazide  (HYZAAR) 100-25 MG tablet, TAKE 1 TABLET BY MOUTH EVERY DAY     Current Outpatient Medications (Other):    ALPRAZolam  (XANAX ) 0.5 MG tablet, Take 1 tablet (0.5 mg total) by mouth 2 (two) times daily as needed.   cholecalciferol (VITAMIN D ) 1000 units tablet, Take 5,000 Units by mouth daily.    mirtazapine  (REMERON ) 7.5 MG tablet, Take 1 tablet (7.5 mg total) by mouth at bedtime.    Objective  There were no vitals taken for this visit.   General: No apparent distress alert and oriented x3 mood and affect normal, dressed appropriately.     After informed written and verbal consent, patient was seated on exam table. Right knee was prepped with alcohol swab and utilizing anterolateral approach, patient's right knee space was injected with 1 cc of 0.5% Marcaine and 5 cc of PRP. Patient tolerated the procedure well without immediate complications.  After informed written and verbal consent, patient was seated on exam table. Left knee was prepped with alcohol swab and utilizing anterolateral approach, patient's left knee space was injected with 1 cc 0.5% Marcaine and then injected 5 cc of PRP. Patient tolerated the procedure well without immediate complications.   Impression and Recommendations:     The above documentation has been reviewed and is accurate and complete Christopher [name] Christopher Beahm, DO

## 2023-08-03 ENCOUNTER — Ambulatory Visit: Payer: Medicare HMO | Admitting: Family Medicine

## 2023-08-03 VITALS — BP 112/78 | HR 70 | Ht 73.0 in

## 2023-08-03 DIAGNOSIS — M170 Bilateral primary osteoarthritis of knee: Secondary | ICD-10-CM

## 2023-08-03 NOTE — Patient Instructions (Signed)
 No ice or IBU for 3 days Heat and Tylenol are ok  See me again in 3 months

## 2023-08-03 NOTE — Assessment & Plan Note (Signed)
 Post PRP instructions given.  Patient has done this previously.

## 2023-09-24 ENCOUNTER — Other Ambulatory Visit: Payer: Self-pay | Admitting: Family Medicine

## 2023-09-24 DIAGNOSIS — E785 Hyperlipidemia, unspecified: Secondary | ICD-10-CM

## 2023-09-28 DIAGNOSIS — D1801 Hemangioma of skin and subcutaneous tissue: Secondary | ICD-10-CM | POA: Diagnosis not present

## 2023-09-28 DIAGNOSIS — L821 Other seborrheic keratosis: Secondary | ICD-10-CM | POA: Diagnosis not present

## 2023-09-28 DIAGNOSIS — L820 Inflamed seborrheic keratosis: Secondary | ICD-10-CM | POA: Diagnosis not present

## 2023-09-28 DIAGNOSIS — L814 Other melanin hyperpigmentation: Secondary | ICD-10-CM | POA: Diagnosis not present

## 2023-10-09 ENCOUNTER — Other Ambulatory Visit: Payer: Self-pay | Admitting: Family Medicine

## 2023-10-09 DIAGNOSIS — F419 Anxiety disorder, unspecified: Secondary | ICD-10-CM

## 2023-11-02 DIAGNOSIS — L0291 Cutaneous abscess, unspecified: Secondary | ICD-10-CM | POA: Diagnosis not present

## 2023-11-02 DIAGNOSIS — L820 Inflamed seborrheic keratosis: Secondary | ICD-10-CM | POA: Diagnosis not present

## 2023-11-09 ENCOUNTER — Ambulatory Visit: Admitting: Family Medicine

## 2023-11-16 DIAGNOSIS — L0100 Impetigo, unspecified: Secondary | ICD-10-CM | POA: Diagnosis not present

## 2023-12-21 DIAGNOSIS — H903 Sensorineural hearing loss, bilateral: Secondary | ICD-10-CM | POA: Diagnosis not present

## 2023-12-21 DIAGNOSIS — H6123 Impacted cerumen, bilateral: Secondary | ICD-10-CM | POA: Diagnosis not present

## 2024-01-03 DIAGNOSIS — L720 Epidermal cyst: Secondary | ICD-10-CM | POA: Diagnosis not present

## 2024-01-03 DIAGNOSIS — D485 Neoplasm of uncertain behavior of skin: Secondary | ICD-10-CM | POA: Diagnosis not present

## 2024-01-06 ENCOUNTER — Ambulatory Visit (INDEPENDENT_AMBULATORY_CARE_PROVIDER_SITE_OTHER): Admitting: Family Medicine

## 2024-01-06 ENCOUNTER — Encounter: Payer: Self-pay | Admitting: Family Medicine

## 2024-01-06 VITALS — BP 124/80 | HR 55 | Temp 96.5°F | Ht 73.0 in | Wt 214.4 lb

## 2024-01-06 DIAGNOSIS — F419 Anxiety disorder, unspecified: Secondary | ICD-10-CM | POA: Diagnosis not present

## 2024-01-06 DIAGNOSIS — Z23 Encounter for immunization: Secondary | ICD-10-CM | POA: Diagnosis not present

## 2024-01-06 DIAGNOSIS — I10 Essential (primary) hypertension: Secondary | ICD-10-CM

## 2024-01-06 DIAGNOSIS — E559 Vitamin D deficiency, unspecified: Secondary | ICD-10-CM

## 2024-01-06 DIAGNOSIS — E785 Hyperlipidemia, unspecified: Secondary | ICD-10-CM | POA: Diagnosis not present

## 2024-01-06 DIAGNOSIS — Z131 Encounter for screening for diabetes mellitus: Secondary | ICD-10-CM | POA: Diagnosis not present

## 2024-01-06 LAB — URINALYSIS, ROUTINE W REFLEX MICROSCOPIC
Bilirubin Urine: NEGATIVE
Hgb urine dipstick: NEGATIVE
Ketones, ur: NEGATIVE
Leukocytes,Ua: NEGATIVE
Nitrite: NEGATIVE
RBC / HPF: NONE SEEN (ref 0–?)
Specific Gravity, Urine: 1.01 (ref 1.000–1.030)
Total Protein, Urine: NEGATIVE
Urine Glucose: NEGATIVE
Urobilinogen, UA: 0.2 (ref 0.0–1.0)
WBC, UA: NONE SEEN (ref 0–?)
pH: 7 (ref 5.0–8.0)

## 2024-01-06 LAB — LIPID PANEL
Cholesterol: 112 mg/dL (ref 0–200)
HDL: 47 mg/dL (ref >39.00)
LDL Cholesterol: 51 mg/dL (ref 0–99)
NonHDL: 64.99
Total CHOL/HDL Ratio: 2
Triglycerides: 68 mg/dL (ref 0.0–149.0)
VLDL: 13.6 mg/dL (ref 0.0–40.0)

## 2024-01-06 LAB — BASIC METABOLIC PANEL WITH GFR
BUN: 16 mg/dL (ref 6–23)
CO2: 27 meq/L (ref 19–32)
Calcium: 9.6 mg/dL (ref 8.4–10.5)
Chloride: 101 meq/L (ref 96–112)
Creatinine, Ser: 0.92 mg/dL (ref 0.40–1.50)
GFR: 82.55 mL/min (ref >60.00)
Glucose, Bld: 91 mg/dL (ref 70–99)
Potassium: 4.6 meq/L (ref 3.5–5.1)
Sodium: 135 meq/L (ref 135–145)

## 2024-01-06 LAB — VITAMIN D 25 HYDROXY (VIT D DEFICIENCY, FRACTURES): VITD: 67.16 ng/mL (ref 30.00–100.00)

## 2024-01-06 LAB — HEMOGLOBIN A1C: Hgb A1c MFr Bld: 5.6 % (ref 4.6–6.5)

## 2024-01-06 MED ORDER — ALPRAZOLAM 0.5 MG PO TABS: 0.5000 mg | ORAL_TABLET | Freq: Two times a day (BID) | ORAL | 2 refills | Status: DC | PRN

## 2024-01-06 NOTE — Progress Notes (Signed)
 Established Patient Office Visit   Subjective:  Patient ID: Christopher Lippy., male    DOB: 09/13/1950  Age: 73 y.o. MRN: [id number]  Chief Complaint  Patient presents with   Medical Management of Chronic Issues    Follow up. Pt is fasting. Pt has loss 30 pounds since last visit.     HPI Encounter Diagnoses  Name Primary?   Dyslipidemia Yes   Anxiety    Essential hypertension    Vitamin D  deficiency    Screening for diabetes mellitus    Immunization due    Presenting today with significant intentional weight loss by working with a weight loss group in his retirement community and exercising regularly with walking and weight training.  He has increased his lean proteins and decreased simple carbohydrates in favor more complex carbohydrates.  Feels great.  Fewer arthritic aches and pains.  Continues with Xanax  twice daily as needed with Remeron  7.5 at at bedtime.   Review of Systems  Constitutional: Negative.   HENT: Negative.    Eyes:  Negative for blurred vision, discharge and redness.  Respiratory: Negative.    Cardiovascular: Negative.   Gastrointestinal:  Negative for abdominal pain.  Genitourinary: Negative.   Musculoskeletal: Negative.  Negative for myalgias.  Skin:  Negative for rash.  Neurological:  Negative for tingling, loss of consciousness and weakness.  Endo/Heme/Allergies:  Negative for polydipsia.     Current Outpatient Medications:    atorvastatin  (LIPITOR) 40 MG tablet, TAKE 1 TABLET BY MOUTH EVERY DAY, Disp: 90 tablet, Rfl: 1   cholecalciferol (VITAMIN D ) 1000 units tablet, Take 5,000 Units by mouth daily., Disp: , Rfl:    losartan -hydrochlorothiazide  (HYZAAR) 100-25 MG tablet, TAKE 1 TABLET BY MOUTH EVERY DAY, Disp: 90 tablet, Rfl: 3   mirtazapine  (REMERON ) 7.5 MG tablet, Take 1 tablet (7.5 mg total) by mouth at bedtime., Disp: 90 tablet, Rfl: 1   ALPRAZolam  (XANAX ) 0.5 MG tablet, Take 1 tablet (0.5 mg total) by mouth 2 (two) times daily as needed for  anxiety., Disp: 60 tablet, Rfl: 2   Objective:     BP 124/80 (BP Location: Right Arm, Patient Position: Sitting, Cuff Size: Normal)   Pulse (!) 55   Temp (!) 96.5 F (35.8 C) (Temporal)   Ht 6' 1 (1.854 m)   Wt 214 lb 6.4 oz (97.3 kg)   SpO2 99%   BMI 28.29 kg/m  BP Readings from Last 3 Encounters:  01/06/24 124/80  08/03/23 112/78  07/04/23 128/86   Wt Readings from Last 3 Encounters:  01/06/24 214 lb 6.4 oz (97.3 kg)  07/04/23 243 lb 3.2 oz (110.3 kg)  06/06/23 244 lb (110.7 kg)      Physical Exam Constitutional:      General: He is not in acute distress.    Appearance: Normal appearance. He is not ill-appearing, toxic-appearing or diaphoretic.  HENT:     Head: Normocephalic and atraumatic.     Right Ear: External ear normal.     Left Ear: External ear normal.  Eyes:     General: No scleral icterus.       Right eye: No discharge.        Left eye: No discharge.     Extraocular Movements: Extraocular movements intact.     Conjunctiva/sclera: Conjunctivae normal.  Pulmonary:     Effort: Pulmonary effort is normal. No respiratory distress.  Skin:    General: Skin is warm and dry.  Neurological:     Mental Status: He  is alert and oriented to person, place, and time.  Psychiatric:        Mood and Affect: Mood normal.        Behavior: Behavior normal.      No results found for any visits on 01/06/24.    The ASCVD Risk score (Arnett DK, et al., 2019) failed to calculate for the following reasons:   The valid total cholesterol range is 130 to 320 mg/dL    Assessment & Plan:   Dyslipidemia -     Lipid panel  Anxiety -     ALPRAZolam ; Take 1 tablet (0.5 mg total) by mouth 2 (two) times daily as needed for anxiety.  Dispense: 60 tablet; Refill: 2  Essential hypertension -     Basic metabolic panel with GFR  Vitamin D  deficiency -     VITAMIN D  25 Hydroxy (Vit-D Deficiency, Fractures)  Screening for diabetes mellitus -     Basic metabolic panel with  GFR -     Hemoglobin A1c -     Urinalysis, Routine w reflex microscopic  Immunization due -     Flu vaccine HIGH DOSE PF(Fluzone Trivalent)    Return in about 6 months (around 07/05/2024), or Keep up the good work.SABRA Elsie Sim Berneta, MD

## 2024-01-09 ENCOUNTER — Ambulatory Visit: Payer: Self-pay | Admitting: Family Medicine

## 2024-01-11 ENCOUNTER — Ambulatory Visit: Admitting: Family Medicine

## 2024-02-06 DIAGNOSIS — L720 Epidermal cyst: Secondary | ICD-10-CM | POA: Diagnosis not present

## 2024-02-06 DIAGNOSIS — L821 Other seborrheic keratosis: Secondary | ICD-10-CM | POA: Diagnosis not present

## 2024-02-14 DIAGNOSIS — L720 Epidermal cyst: Secondary | ICD-10-CM | POA: Diagnosis not present

## 2024-03-14 ENCOUNTER — Telehealth: Payer: Self-pay | Admitting: Family Medicine

## 2024-03-14 NOTE — Telephone Encounter (Signed)
 Patient called to reschedule appt. He wanted to let Dr. Claudene know that he has lost 35 pounds since he was here last and that is what is helping his knees. FYI

## 2024-03-21 ENCOUNTER — Other Ambulatory Visit: Payer: Self-pay | Admitting: Family Medicine

## 2024-03-21 ENCOUNTER — Ambulatory Visit: Admitting: Family Medicine

## 2024-03-21 DIAGNOSIS — G4709 Other insomnia: Secondary | ICD-10-CM

## 2024-03-21 DIAGNOSIS — E785 Hyperlipidemia, unspecified: Secondary | ICD-10-CM

## 2024-03-21 NOTE — Telephone Encounter (Signed)
 Refill request for  Mirtazapine  7.5 mg LR 06/06/23, #90, 1 rf  Atorvastatin  40 mg LR  09/26/23, #90, 1 rf LOV   12/09/23 FOV  07/05/24  Please review and advise.  Thanks.  Dm/cma

## 2024-03-29 ENCOUNTER — Other Ambulatory Visit: Payer: Self-pay | Admitting: Family Medicine

## 2024-03-29 DIAGNOSIS — I998 Other disorder of circulatory system: Secondary | ICD-10-CM

## 2024-04-02 ENCOUNTER — Inpatient Hospital Stay: Payer: Medicare HMO

## 2024-04-02 ENCOUNTER — Ambulatory Visit: Payer: Medicare HMO | Admitting: Hematology & Oncology

## 2024-04-09 ENCOUNTER — Other Ambulatory Visit: Payer: Self-pay | Admitting: Family Medicine

## 2024-04-09 DIAGNOSIS — F419 Anxiety disorder, unspecified: Secondary | ICD-10-CM

## 2024-04-27 ENCOUNTER — Inpatient Hospital Stay

## 2024-04-27 ENCOUNTER — Inpatient Hospital Stay: Admitting: Hematology & Oncology

## 2024-05-16 ENCOUNTER — Ambulatory Visit: Admitting: Family Medicine

## 2024-06-08 ENCOUNTER — Ambulatory Visit

## 2024-06-12 ENCOUNTER — Ambulatory Visit

## 2024-07-05 ENCOUNTER — Ambulatory Visit: Admitting: Family Medicine
